# Patient Record
Sex: Female | Born: 1935 | Race: Black or African American | Hispanic: No | Marital: Single | Smoking: Never smoker
Health system: Southern US, Community
[De-identification: ages and names within clinical notes are randomized; demographics above are authoritative.]

## PROBLEM LIST (undated history)

## (undated) DIAGNOSIS — I10 Essential (primary) hypertension: Secondary | ICD-10-CM

## (undated) DIAGNOSIS — S129XXA Fracture of neck, unspecified, initial encounter: Secondary | ICD-10-CM

## (undated) DIAGNOSIS — M1990 Unspecified osteoarthritis, unspecified site: Secondary | ICD-10-CM

## (undated) DIAGNOSIS — K219 Gastro-esophageal reflux disease without esophagitis: Secondary | ICD-10-CM

## (undated) HISTORY — DX: Fracture of neck, unspecified, initial encounter: S12.9XXA

## (undated) HISTORY — DX: Unspecified osteoarthritis, unspecified site: M19.90

## (undated) HISTORY — PX: ABDOMINAL HYSTERECTOMY: SHX81

## (undated) HISTORY — DX: Gastro-esophageal reflux disease without esophagitis: K21.9

## (undated) HISTORY — DX: Essential (primary) hypertension: I10

---

## 2013-05-15 DIAGNOSIS — S129XXA Fracture of neck, unspecified, initial encounter: Secondary | ICD-10-CM

## 2013-05-15 HISTORY — PX: EYE SURGERY: SHX253

## 2013-05-15 HISTORY — DX: Fracture of neck, unspecified, initial encounter: S12.9XXA

## 2014-04-28 DIAGNOSIS — N184 Chronic kidney disease, stage 4 (severe): Secondary | ICD-10-CM | POA: Insufficient documentation

## 2014-04-28 DIAGNOSIS — N189 Chronic kidney disease, unspecified: Secondary | ICD-10-CM | POA: Insufficient documentation

## 2014-05-29 ENCOUNTER — Other Ambulatory Visit: Payer: Self-pay | Admitting: Neurosurgery

## 2014-05-29 DIAGNOSIS — S12100G Unspecified displaced fracture of second cervical vertebra, subsequent encounter for fracture with delayed healing: Secondary | ICD-10-CM

## 2014-06-02 ENCOUNTER — Encounter: Payer: Self-pay | Admitting: General Surgery

## 2014-06-02 ENCOUNTER — Ambulatory Visit (INDEPENDENT_AMBULATORY_CARE_PROVIDER_SITE_OTHER): Payer: Medicare Other | Admitting: General Surgery

## 2014-06-02 VITALS — BP 128/78 | HR 68 | Resp 14 | Ht 64.5 in | Wt 116.0 lb

## 2014-06-02 DIAGNOSIS — K8020 Calculus of gallbladder without cholecystitis without obstruction: Secondary | ICD-10-CM

## 2014-06-02 NOTE — Patient Instructions (Addendum)
The patient is aware to call back for any questions or concerns.  Cholelithiasis Cholelithiasis (also called gallstones) is a form of gallbladder disease. The gallbladder is a small organ that helps you digest fats. Symptoms of gallstones are:  Feeling sick to your stomach (nausea).  Throwing up (vomiting).  Belly pain.  Yellowing of the skin (jaundice).  Sudden pain. You may feel the pain for minutes to hours.  Fever.  Pain to the touch. HOME CARE  Only take medicines as told by your doctor.  Eat a low-fat diet until you see your doctor again. Eating fat can result in pain.  Follow up with your doctor as told. Attacks usually happen time after time. Surgery is usually needed for permanent treatment. GET HELP RIGHT AWAY IF:   Your pain gets worse.  Your pain is not helped by medicines.  You have a fever and lasting symptoms for more than 2-3 days.  You have a fever and your symptoms suddenly get worse.  You keep feeling sick to your stomach and throwing up. MAKE SURE YOU:   Understand these instructions.  Will watch your condition.  Will get help right away if you are not doing well or get worse. Document Released: 10/18/2007 Document Revised: 01/01/2013 Document Reviewed: 10/23/2012 [facility] Patient Information 2015 [place], [place]. This information is not intended to replace advice given to you by your health care provider. Make sure you discuss any questions you have with your health care provider.

## 2014-06-02 NOTE — Progress Notes (Signed)
Patient ID: [name], female   DOB: [birth year], 81 y.o.   MRN: [id number]  Chief Complaint  Patient presents with  . Other    gall stones    HPI [name] is a 81 y.o. female.  Here today for evaluation of gallstones. She is here today with her daughter in law and has lived with her and her son for about 4 months. She states the "attacks" are described as right abdominal pain "pressure" and "need to belch" feeling. Progressively getting worse. No nausea or vomiting. The pain last about 30-45 minutes. Not associated with foods but worse in the evenings. She does state that sometimes once she belches she feels better.  Ultrasound completed 05-06-14 at Dr. Elijio Miles office.  She is wearing a neck brace for a C2 fracture from falling out of the chair while living in [place]. She has seen a neurosurgeon and has a CT scan for March 2016.  HPI  Past Medical History  Diagnosis Date  . Hypertension   . Arthritis   . GERD (gastroesophageal reflux disease)   . Neck fracture 2015    C2    Past Surgical History  Procedure Laterality Date  . Abdominal hysterectomy    . Eye surgery  2015    cataract    History reviewed. No pertinent family history.  Social History History  Substance Use Topics  . Smoking status: Never Smoker   . Smokeless tobacco: Never Used  . Alcohol Use: No    No Known Allergies  Current Outpatient Prescriptions  Medication Sig Dispense Refill  . allopurinol (ZYLOPRIM) 300 MG tablet Take 300 mg by mouth daily.  2  . hydrALAZINE (APRESOLINE) 50 MG tablet Take 50 mg by mouth 3 (three) times daily.  2  . metoprolol succinate (TOPROL-XL) 50 MG 24 hr tablet Take 50 mg by mouth daily.  0  . NEXIUM 40 MG capsule Take 40 mg by mouth daily.  2  . sertraline (ZOLOFT) 25 MG tablet Take 25 mg by mouth every evening.  2  . sucralfate (CARAFATE) 1 G tablet Take 1 g by mouth 4 (four) times daily.   0  . tiZANidine (ZANAFLEX) 2 MG tablet Take 2 mg by mouth 2 (two)  times daily.  0   No current facility-administered medications for this visit.    Review of Systems Review of Systems  Constitutional: Negative.   Respiratory: Negative.   Cardiovascular: Negative.   Gastrointestinal: Positive for abdominal pain. Negative for nausea, diarrhea and constipation.    Blood pressure 128/78, pulse 68, resp. rate 14, height 5' 4.5" (1.638 m), weight 116 lb (52.617 kg).  Physical Exam Physical Exam  Constitutional: She is oriented to person, place, and time. She appears well-developed and well-nourished.  Eyes: Conjunctivae are normal. No scleral icterus.  Neck: Neck supple.  Cardiovascular: Normal rate, regular rhythm and normal heart sounds.   Pulmonary/Chest: Effort normal and breath sounds normal.  Abdominal: Soft. Normal appearance and bowel sounds are normal. There is no tenderness.  Lymphadenopathy:    She has no cervical adenopathy.  Neurological: She is alert and oriented to person, place, and time.  Skin: Skin is warm and dry.    Data Reviewed Office notes and ultrasound report-showing gallstones.  Assessment    Gall stones. Likely this is the cause of her abd pain. However she has some significant other health issues-C 2 fracture, new kidney issues based on recent labs-seeing nephrologist    Plan    Pt  needs clearance from neurology and nephrology before any decision made about her gallbladder. I discussed all of this in full with pt and her daughter-in-law. In the event of her developing acute cholecystitis, consideration can also be given for percutaneous drainage      Cc: Dr. Sharrie Rothman 06/02/2014, 4:58 PM

## 2014-06-04 ENCOUNTER — Ambulatory Visit: Payer: Self-pay | Admitting: General Surgery

## 2014-06-04 ENCOUNTER — Ambulatory Visit: Payer: Self-pay | Admitting: Nephrology

## 2014-06-13 ENCOUNTER — Emergency Department: Payer: Self-pay | Admitting: Emergency Medicine

## 2014-06-13 LAB — COMPREHENSIVE METABOLIC PANEL
Albumin: 3.4 g/dL (ref 3.4–5.0)
Alkaline Phosphatase: 69 U/L (ref 46–116)
Anion Gap: 6 — ABNORMAL LOW (ref 7–16)
BUN: 14 mg/dL (ref 7–18)
Bilirubin,Total: 0.2 mg/dL (ref 0.2–1.0)
Calcium, Total: 9 mg/dL (ref 8.5–10.1)
Chloride: 101 mmol/L (ref 98–107)
Co2: 33 mmol/L — ABNORMAL HIGH (ref 21–32)
Creatinine: 1.1 mg/dL (ref 0.60–1.30)
EGFR (African American): 60 — ABNORMAL LOW
EGFR (Non-African Amer.): 49 — ABNORMAL LOW
Glucose: 99 mg/dL (ref 65–99)
Osmolality: 280 (ref 275–301)
Potassium: 4 mmol/L (ref 3.5–5.1)
SGOT(AST): 26 U/L (ref 15–37)
SGPT (ALT): 14 U/L (ref 14–63)
Sodium: 140 mmol/L (ref 136–145)
Total Protein: 7.7 g/dL (ref 6.4–8.2)

## 2014-06-13 LAB — CBC
HCT: 29 % — ABNORMAL LOW (ref 35.0–47.0)
HGB: 9.3 g/dL — ABNORMAL LOW (ref 12.0–16.0)
MCH: 26.2 pg (ref 26.0–34.0)
MCHC: 32.1 g/dL (ref 32.0–36.0)
MCV: 82 fL (ref 80–100)
Platelet: 316 10*3/uL (ref 150–440)
RBC: 3.56 10*6/uL — ABNORMAL LOW (ref 3.80–5.20)
RDW: 15.1 % — ABNORMAL HIGH (ref 11.5–14.5)
WBC: 7.4 10*3/uL (ref 3.6–11.0)

## 2014-06-13 LAB — LIPASE, BLOOD: Lipase: 69 U/L — ABNORMAL LOW (ref 73–393)

## 2014-06-13 LAB — TROPONIN I: Troponin-I: <0.02 ng/mL

## 2014-06-17 ENCOUNTER — Telehealth: Payer: Self-pay | Admitting: *Deleted

## 2014-06-17 NOTE — Telephone Encounter (Signed)
Per Nona Dell, patient's daughter in law, patient was seen in the ED over the weekend because she had been in a lot of pain and crying. States patient also keeps loosing weight.  Dr. Jamal Collin spoke with patient's daughter in law today. She was made aware that he needs neurosurgery and cardiac clearance prior to scheduling surgery for this patient. He is concerned about intubating the patient at time of surgery due to her C2 spine fracture.  Angel at Dr. Levan Hurst office (Phone: [phone]) was contacted and made aware that we will need neurosurgery clearance prior to scheduling gallbladder surgery. Dr. Sherwood Gambler is out of town the rest of this week and will return on Monday, 06-22-14. Glenard Haring will talk with him and see if March 2016 CT scan and office visit can be moved up so that patient can go ahead and get clearance. We will await a call from their office on Monday afternoon.   Once we receive clearance from neurosurgery, patient will also need cardiac clearance from her cardiologist (Dr. Neoma Laming) prior to gallbladder surgery.

## 2014-06-25 ENCOUNTER — Encounter: Payer: Self-pay | Admitting: *Deleted

## 2014-06-25 NOTE — Progress Notes (Signed)
Patient ID: [name], female   DOB: 1920-09-17, 81 y.o.   MRN: [id number]  Message was left for [facility] at Dr. Donnella Bi office since we have not heard back regarding surgical clearance for this patient.   We will await their decision.

## 2014-07-01 ENCOUNTER — Telehealth: Payer: Self-pay | Admitting: *Deleted

## 2014-07-01 NOTE — Telephone Encounter (Signed)
Per Glenard Haring at Dr. Donnella Bi office [facility]), she states that Dr. Sherwood Gambler does not think that patient's fracture will be healed in the next month and he does not feel the need to repeat CT scan at this time. Also, if Dr. Jamal Collin feels that surgery is necessary and cannot wait, then the anesthesiologist will need to do an intubation with a glide scope keeping the head and neck in a neutral position and immobilized.   I have requested that their office fax [place] something in writing stating this with MD signature. Fax number provided.

## 2014-07-01 NOTE — Telephone Encounter (Signed)
Patient's daughter-in-law, Nona Dell, was contacted today and notified of Dr. Donnella Bi response.  An appointment for follow up with Dr. Jamal Collin was made for tomorrow, 07-02-14. Patient's son will be bringing the patient to the appointment.   Daughter-in-law states that the patient's nephrologist is Dr. Juleen China (Phone: [phone]). A message was left for [facility] at [facility] to fax all recent notes, labs, x-ray reports, etc.

## 2014-07-02 ENCOUNTER — Encounter: Payer: Self-pay | Admitting: General Surgery

## 2014-07-02 ENCOUNTER — Ambulatory Visit (INDEPENDENT_AMBULATORY_CARE_PROVIDER_SITE_OTHER): Payer: Medicare Other | Admitting: General Surgery

## 2014-07-02 VITALS — BP 124/64 | HR 64 | Resp 14 | Ht 64.0 in | Wt 112.0 lb

## 2014-07-02 DIAGNOSIS — K8020 Calculus of gallbladder without cholecystitis without obstruction: Secondary | ICD-10-CM

## 2014-07-02 NOTE — Patient Instructions (Signed)
Cholecystitis Cholecystitis is an inflammation of your gallbladder. It is usually caused by a buildup of gallstones or sludge (cholelithiasis) in your gallbladder. The gallbladder stores a fluid that helps digest fats (bile). Cholecystitis is serious and needs treatment right away.  CAUSES   Gallstones. Gallstones can block the tube that leads to your gallbladder, causing bile to build up. As bile builds up, the gallbladder becomes inflamed.  Bile duct problems, such as blockage from scarring or kinking.  Tumors. Tumors can stop bile from leaving your gallbladder correctly, causing bile to build up. As bile builds up, the gallbladder becomes inflamed. SYMPTOMS   Nausea.  Vomiting.  Abdominal pain, especially in the upper right area of your abdomen.  Abdominal tenderness or bloating.  Sweating.  Chills.  Fever.  Yellowing of the skin and the whites of the eyes (jaundice). DIAGNOSIS  Your caregiver may order blood tests to look for infection or gallbladder problems. Your caregiver may also order imaging tests, such as an ultrasound or computed tomography (CT) scan. Further tests may include a hepatobiliary iminodiacetic acid (HIDA) scan. This scan allows your caregiver to see your bile move from the liver to the gallbladder and to the small intestine. TREATMENT  A hospital stay is usually necessary to lessen the inflammation of your gallbladder. You may be required to not eat or drink (fast) for a certain amount of time. You may be given medicine to treat pain or an antibiotic medicine to treat an infection. Surgery may be needed to remove your gallbladder (cholecystectomy) once the inflammation has gone down. Surgery may be needed right away if you develop complications such as death of gallbladder tissue (gangrene) or a tear (perforation) of the gallbladder.  [place] care will depend on your treatment. In general:  If you were given antibiotics, take them as  directed. Finish them even if you start to feel better.  Only take over-the-counter or prescription medicines for pain, discomfort, or fever as directed by your caregiver.  Follow a low-fat diet until you see your caregiver again.  Keep all follow-up visits as directed by your caregiver. SEEK IMMEDIATE MEDICAL CARE IF:   Your pain is increasing and not controlled by medicines.  Your pain moves to another part of your abdomen or to your back.  You have a fever.  You have nausea and vomiting. MAKE SURE YOU:  Understand these instructions.  Will watch your condition.  Will get help right away if you are not doing well or get worse. Document Released: 05/01/2005 Document Revised: 07/24/2011 Document Reviewed: 03/17/2011 [facility] Patient Information 2015 [place], [place]. This information is not intended to replace advice given to you by your health care provider. Make sure you discuss any questions you have with your health care provider.

## 2014-07-02 NOTE — Progress Notes (Signed)
Patient ID: [name], female   DOB: [birth year], 81 y.o.   MRN: [id number]  Chief Complaint  Patient presents with  . Other    Gallbladder    HPI [name] is a 81 y.o. female here today to discuss gallbladder surgery. She states that the pain is described as right abdominal pain "pressure" and "need to belch" feeling. The pain is better with the hydrocodone. Her appointment is in March for follow up from the neck fracture. She is still wearing the neck support. She is here today with her son.  HPI  Past Medical History  Diagnosis Date  . Hypertension   . Arthritis   . GERD (gastroesophageal reflux disease)   . Neck fracture 2015    C2    Past Surgical History  Procedure Laterality Date  . Abdominal hysterectomy    . Eye surgery  2015    cataract    History reviewed. No pertinent family history.  Social History History  Substance Use Topics  . Smoking status: Never Smoker   . Smokeless tobacco: Never Used  . Alcohol Use: No    No Known Allergies  Current Outpatient Prescriptions  Medication Sig Dispense Refill  . allopurinol (ZYLOPRIM) 300 MG tablet Take 300 mg by mouth daily.  2  . hydrALAZINE (APRESOLINE) 50 MG tablet Take 50 mg by mouth 3 (three) times daily.  2  . HYDROcodone-acetaminophen (NORCO/VICODIN) 5-325 MG per tablet   0  . metoprolol succinate (TOPROL-XL) 50 MG 24 hr tablet Take 50 mg by mouth daily.  0  . NEXIUM 40 MG capsule Take 40 mg by mouth daily.  2  . sertraline (ZOLOFT) 25 MG tablet Take 25 mg by mouth every evening.  2  . sucralfate (CARAFATE) 1 G tablet Take 1 g by mouth 4 (four) times daily.   0  . tiZANidine (ZANAFLEX) 2 MG tablet Take 2 mg by mouth 2 (two) times daily.  0  . Vitamin D, Ergocalciferol, (DRISDOL) 50000 UNITS CAPS capsule   0   No current facility-administered medications for this visit.    Review of Systems Review of Systems  Constitutional: Negative.   Respiratory: Negative.   Cardiovascular: Negative.    Gastrointestinal: Positive for nausea and abdominal pain. Negative for vomiting, diarrhea, constipation, blood in stool, abdominal distention, anal bleeding and rectal pain.    Blood pressure 124/64, pulse 64, resp. rate 14, height 5\' 4"  (1.626 m), weight 112 lb (50.803 kg).  Physical Exam Physical Exam  Constitutional: She is oriented to person, place, and time. She appears well-developed and well-nourished.  Neck: Neck supple.  Cardiovascular: Normal rate, regular rhythm and normal heart sounds.   Pulmonary/Chest: Effort normal and breath sounds normal.  Abdominal: Soft. Bowel sounds are normal. There is no tenderness.  Lymphadenopathy:    She has no cervical adenopathy.  Neurological: She is alert and oriented to person, place, and time.  Skin: Skin is warm and dry.    Data Reviewed Office notes. Recent labs from nephrologist Assessment    Stable physical exam. Pt has biliary colic but has not had any complications from gallstones. Received note from neurosurgery-her C2 fracture is not fully healed. Unsafe to attempt an ET tube for general anesthesia at present.     Plan    Follow up in one month after she has f/u CT of C spine and neurosurgery f/u. Discussed all this in full with pt.        SANKAR,SEEPLAPUTHUR G 07/02/2014, 2:28  PM

## 2014-07-27 ENCOUNTER — Ambulatory Visit
Admission: RE | Admit: 2014-07-27 | Discharge: 2014-07-27 | Disposition: A | Discharge status: Home / Self Care | Payer: Medicare Other | Source: Ambulatory Visit | Attending: Neurosurgery | Admitting: Neurosurgery

## 2014-07-27 DIAGNOSIS — S12100G Unspecified displaced fracture of second cervical vertebra, subsequent encounter for fracture with delayed healing: Secondary | ICD-10-CM

## 2014-08-03 ENCOUNTER — Encounter: Payer: Self-pay | Admitting: General Surgery

## 2014-08-03 ENCOUNTER — Ambulatory Visit (INDEPENDENT_AMBULATORY_CARE_PROVIDER_SITE_OTHER): Payer: Medicare Other | Admitting: General Surgery

## 2014-08-03 VITALS — BP 140/60 | HR 66 | Resp 14 | Ht 64.0 in | Wt 112.6 lb

## 2014-08-03 DIAGNOSIS — K8020 Calculus of gallbladder without cholecystitis without obstruction: Secondary | ICD-10-CM | POA: Diagnosis not present

## 2014-08-03 NOTE — Patient Instructions (Addendum)

## 2014-08-03 NOTE — Progress Notes (Signed)
Patient ID: CLOYE LIONS, female   DOB: 1921-05-04, 81 y.o.   MRN: [id number]  Chief Complaint  Patient presents with  . Follow-up    CT scan results    HPI [name] is a 81 y.o. female here today for her results of her CT scan that was done on 07/27/14 ordered by Dr Sherwood Gambler. She has been cleared by neurosurgery for her neck. She does report gallbladder attacks daily these vary in pain and length. She is followed by Dr. Murlean Iba for her kidneys. HPI  Past Medical History  Diagnosis Date  . Hypertension   . Arthritis   . GERD (gastroesophageal reflux disease)   . Neck fracture 2015    C2    Past Surgical History  Procedure Laterality Date  . Abdominal hysterectomy    . Eye surgery  2015    cataract    No family history on file.  Social History History  Substance Use Topics  . Smoking status: Never Smoker   . Smokeless tobacco: Never Used  . Alcohol Use: No    No Known Allergies  Current Outpatient Prescriptions  Medication Sig Dispense Refill  . allopurinol (ZYLOPRIM) 300 MG tablet Take 300 mg by mouth daily.  2  . hydrALAZINE (APRESOLINE) 50 MG tablet Take 50 mg by mouth 3 (three) times daily.  2  . HYDROcodone-acetaminophen (NORCO/VICODIN) 5-325 MG per tablet   0  . metoprolol succinate (TOPROL-XL) 50 MG 24 hr tablet Take 50 mg by mouth daily.  0  . NEXIUM 40 MG capsule Take 40 mg by mouth daily.  2  . sertraline (ZOLOFT) 25 MG tablet Take 25 mg by mouth every evening.  2  . sucralfate (CARAFATE) 1 G tablet Take 1 g by mouth 4 (four) times daily.   0  . tiZANidine (ZANAFLEX) 2 MG tablet Take 2 mg by mouth 2 (two) times daily.  0  . Vitamin D, Ergocalciferol, (DRISDOL) 50000 UNITS CAPS capsule   0   No current facility-administered medications for this visit.    Review of Systems Review of Systems  Constitutional: Negative.   Respiratory: Negative.   Cardiovascular: Negative.     Blood pressure 140/60, pulse 66, resp. rate 14, height 5\' 4"  (1.626  m), weight 112 lb 9.6 oz (51.075 kg).  Physical Exam Physical Exam  Constitutional: She is oriented to person, place, and time. She appears well-developed and well-nourished.  Eyes: Conjunctivae are normal. No scleral icterus.  Neck: Neck supple.  Cardiovascular: Normal rate, regular rhythm and normal heart sounds.   Pulmonary/Chest: Effort normal and breath sounds normal.  Abdominal: Soft. Bowel sounds are normal. She exhibits no distension. There is no tenderness.  Lymphadenopathy:    She has no cervical adenopathy.  Neurological: She is alert and oriented to person, place, and time.  Skin: Skin is warm and dry.    Data Reviewed Prior notes.  Assessment    Gallstones. Symptomatic with frequent episodes of ruq pain. Pt is in very good health and with good performance status.   Plan   Recent CT of the neck showed complete healing of the C2 fracture. She has been cleared by neurosurgery for safe intubation.  Laparoscopic Cholecystectomy with Intraoperative Cholangiogram. The procedure, including it's potential risks and complications (including but not limited to infection, bleeding, injury to intra-abdominal organs or bile ducts, bile leak, poor cosmetic result, sepsis and death) were discussed with the patient in detail. Non-operative options, including their inherent risks (acute calculous cholecystitis  with possible choledocholithiasis or gallstone pancreatitis, with the risk of ascending cholangitis, sepsis, and death) were discussed as well. The patient expressed and understanding of what we discussed and wishes to proceed with laparoscopic cholecystectomy. The patient further understands that if it is technically not possible, or it is unsafe to proceed laparoscopically, that I will convert to an open cholecystectomy.   Will get baseline labs today. If ok will proceed with surgery.  Patient's surgery has been scheduled for 08-06-14 at [facility].  Demorris Choyce G 08/03/2014, 10:32  AM

## 2014-08-04 LAB — CBC WITH DIFFERENTIAL/PLATELET
Basophils Absolute: 0 10*3/uL (ref 0.0–0.2)
Basos: 0 %
Eos: 0 %
Eosinophils Absolute: 0 10*3/uL (ref 0.0–0.4)
HCT: 31.8 % — ABNORMAL LOW (ref 34.0–46.6)
Hemoglobin: 10 g/dL — ABNORMAL LOW (ref 11.1–15.9)
Immature Grans (Abs): 0 10*3/uL (ref 0.0–0.1)
Immature Granulocytes: 0 %
Lymphocytes Absolute: 1.7 10*3/uL (ref 0.7–3.1)
Lymphs: 21 %
MCH: 24.9 pg — ABNORMAL LOW (ref 26.6–33.0)
MCHC: 31.4 g/dL — ABNORMAL LOW (ref 31.5–35.7)
MCV: 79 fL (ref 79–97)
Monocytes Absolute: 0.6 10*3/uL (ref 0.1–0.9)
Monocytes: 7 %
Neutrophils Absolute: 5.8 10*3/uL (ref 1.4–7.0)
Neutrophils Relative %: 72 %
Platelets: 464 10*3/uL — ABNORMAL HIGH (ref 150–379)
RBC: 4.02 x10E6/uL (ref 3.77–5.28)
RDW: 17.7 % — ABNORMAL HIGH (ref 12.3–15.4)
WBC: 8.2 10*3/uL (ref 3.4–10.8)

## 2014-08-04 LAB — COMPREHENSIVE METABOLIC PANEL
ALT: 9 IU/L (ref 0–32)
AST: 13 IU/L (ref 0–40)
Albumin/Globulin Ratio: 1.1 (ref 1.1–2.5)
Albumin: 3.7 g/dL (ref 3.2–4.6)
Alkaline Phosphatase: 72 IU/L (ref 39–117)
BUN/Creatinine Ratio: 7 — ABNORMAL LOW (ref 11–26)
BUN: 8 mg/dL — ABNORMAL LOW (ref 10–36)
Bilirubin Total: 0.2 mg/dL (ref 0.0–1.2)
CO2: 29 mmol/L (ref 18–29)
Calcium: 9.3 mg/dL (ref 8.7–10.3)
Chloride: 100 mmol/L (ref 97–108)
Creatinine, Ser: 1.13 mg/dL — ABNORMAL HIGH (ref 0.57–1.00)
GFR calc Af Amer: 49 mL/min/{1.73_m2} — ABNORMAL LOW (ref >59)
GFR calc non Af Amer: 43 mL/min/{1.73_m2} — ABNORMAL LOW (ref >59)
Globulin, Total: 3.3 g/dL (ref 1.5–4.5)
Glucose: 82 mg/dL (ref 65–99)
Potassium: 5 mmol/L (ref 3.5–5.2)
Sodium: 146 mmol/L — ABNORMAL HIGH (ref 134–144)
Total Protein: 7 g/dL (ref 6.0–8.5)

## 2014-08-05 ENCOUNTER — Ambulatory Visit: Payer: Self-pay | Admitting: General Surgery

## 2014-08-06 ENCOUNTER — Encounter: Payer: Self-pay | Admitting: General Surgery

## 2014-08-06 ENCOUNTER — Ambulatory Visit: Payer: Self-pay | Admitting: General Surgery

## 2014-08-06 DIAGNOSIS — K8010 Calculus of gallbladder with chronic cholecystitis without obstruction: Secondary | ICD-10-CM

## 2014-08-06 HISTORY — PX: CHOLECYSTECTOMY: SHX55

## 2014-08-10 ENCOUNTER — Encounter: Payer: Self-pay | Admitting: General Surgery

## 2014-08-17 ENCOUNTER — Encounter: Payer: Self-pay | Admitting: General Surgery

## 2014-08-17 ENCOUNTER — Ambulatory Visit (INDEPENDENT_AMBULATORY_CARE_PROVIDER_SITE_OTHER): Payer: Medicare Other | Admitting: General Surgery

## 2014-08-17 VITALS — BP 136/70 | HR 74 | Resp 12 | Ht 64.0 in | Wt 117.0 lb

## 2014-08-17 DIAGNOSIS — K8020 Calculus of gallbladder without cholecystitis without obstruction: Secondary | ICD-10-CM

## 2014-08-17 NOTE — Progress Notes (Signed)
This is a 81 year old female here today for her post op gallbladder surgery done on 08/06/14. Patient state she is doing well.  Port sites are clean and healing well. Lungs are clear. Path showed cholelithiasis and chronic cholecystitis Patient to return as needed.

## 2014-08-17 NOTE — Patient Instructions (Signed)
Patient to return as needed. 

## 2014-09-07 LAB — SURGICAL PATHOLOGY

## 2014-09-13 NOTE — Op Note (Signed)
PATIENT NAME:  [name], [name] MR#:  [id number] DATE OF BIRTH:  [birth year]  DATE OF PROCEDURE:  08/06/2014  PREOPERATIVE DIAGNOSIS: Chronic cholecystitis and cholelithiasis.   POSTOPERATIVE DIAGNOSIS: Chronic cholecystitis and cholelithiasis.   OPERATION: Laparoscopy, cholecystectomy, attempted cholangiogram.   SURGEON: S.G. Jamal Collin, MD   ANESTHESIA: General.   COMPLICATIONS: None.   ESTIMATED BLOOD LOSS: Minimal.   DRAINS: None.   PROCEDURE: This patient was put to sleep in the supine position on the operating table. The abdomen was prepped and draped out as a sterile field. Timeout was performed. At the umbilicus, a port site incision was made and the fascia was lifted up and the Veress needle positioned in the peritoneal cavity, verified with the hanging drop method. Pneumoperitoneum was obtained and a 10 mm port was placed. The camera was introduced with good visualization of the peritoneal cavity. The liver was tented up to the anterior abdominal wall, with filmy adhesions likely from prior perihepatitis. The liver otherwise appeared normal. Epigastric and 2 lateral 5 mm ports were placed. The gallbladder was satisfactorily exposed and retracted cephalad. It was noted to be moderately distended but without any acute changes. Some adhesions surrounding this were taken down and the Hartmann pouch was identified, and further dissection was then performed to free up the cystic duct and cholangiogram was performed with a Kumar clamp and catheter. The contrast preferentially flowed to the gallbladder and did not fill the bile duct. Manipulation of the catheter did not allow this to happen. Further attempts were not made since the patient had no evidence of common duct involvement on clinical exam and labs. The catheter that was used to decompress the gallbladder removed. The cystic duct was hemoclipped and cut. The cystic artery was identified. It was freed, hemoclipped and cut, and the posterior  branch was similarly hemoclipped and cut. The gallbladder was dissected free from its bed using cautery for control of bleeding. After ensuring hemostasis, the area was irrigated with saline and all fluid was suctioned out. The gallbladder was brought out through the umbilical port site with a retrieval bag and noted to contain multiple pigmented stones measuring up to 5 mm in size. The fascial opening of the umbilicus was closed with 2-0 Vicryl stitches. Pneumoperitoneum was released. The remaining ports were removed. Skin incisions were closed with subcuticular 4-0 Vicryl, reinforced with Steri-Strips, and a dry sterile dressing was placed. The patient subsequently was extubated and returned to the recovery room in stable condition.    ____________________________ S.Robinette Haines, MD sgs:ST D: 08/07/2014 14:58:15 ET T: 08/08/2014 00:08:38 ET JOB#: [id number]  cc: S.G. Jamal Collin, MD, <Dictator> [facility] Robinette Haines MD ELECTRONICALLY SIGNED 08/08/2014 15:27

## 2015-03-17 ENCOUNTER — Inpatient Hospital Stay
Admission: EM | Admit: 2015-03-17 | Discharge: 2015-03-19 | DRG: 392 | Disposition: A | Discharge status: Home / Self Care | Payer: Medicare Other | Attending: General Surgery | Admitting: General Surgery

## 2015-03-17 DIAGNOSIS — M1990 Unspecified osteoarthritis, unspecified site: Secondary | ICD-10-CM | POA: Diagnosis present

## 2015-03-17 DIAGNOSIS — Z79899 Other long term (current) drug therapy: Secondary | ICD-10-CM | POA: Diagnosis not present

## 2015-03-17 DIAGNOSIS — I129 Hypertensive chronic kidney disease with stage 1 through stage 4 chronic kidney disease, or unspecified chronic kidney disease: Secondary | ICD-10-CM | POA: Diagnosis present

## 2015-03-17 DIAGNOSIS — R109 Unspecified abdominal pain: Secondary | ICD-10-CM | POA: Diagnosis present

## 2015-03-17 DIAGNOSIS — D638 Anemia in other chronic diseases classified elsewhere: Secondary | ICD-10-CM | POA: Diagnosis present

## 2015-03-17 DIAGNOSIS — N183 Chronic kidney disease, stage 3 (moderate): Secondary | ICD-10-CM | POA: Diagnosis present

## 2015-03-17 DIAGNOSIS — K219 Gastro-esophageal reflux disease without esophagitis: Secondary | ICD-10-CM | POA: Diagnosis present

## 2015-03-17 DIAGNOSIS — E876 Hypokalemia: Secondary | ICD-10-CM | POA: Diagnosis present

## 2015-03-17 DIAGNOSIS — R1030 Lower abdominal pain, unspecified: Secondary | ICD-10-CM

## 2015-03-17 LAB — COMPREHENSIVE METABOLIC PANEL
ALT: 8 U/L — ABNORMAL LOW (ref 14–54)
AST: 17 U/L (ref 15–41)
Albumin: 3.7 g/dL (ref 3.5–5.0)
Alkaline Phosphatase: 62 U/L (ref 38–126)
Anion gap: 9 (ref 5–15)
BUN: 16 mg/dL (ref 6–20)
CO2: 27 mmol/L (ref 22–32)
Calcium: 9.2 mg/dL (ref 8.9–10.3)
Chloride: 101 mmol/L (ref 101–111)
Creatinine, Ser: 1.12 mg/dL — ABNORMAL HIGH (ref 0.44–1.00)
GFR calc Af Amer: 48 mL/min — ABNORMAL LOW (ref >60)
GFR calc non Af Amer: 41 mL/min — ABNORMAL LOW (ref >60)
Glucose, Bld: 121 mg/dL — ABNORMAL HIGH (ref 65–99)
Potassium: 3.9 mmol/L (ref 3.5–5.1)
Sodium: 137 mmol/L (ref 135–145)
Total Bilirubin: 0.2 mg/dL — ABNORMAL LOW (ref 0.3–1.2)
Total Protein: 8 g/dL (ref 6.5–8.1)

## 2015-03-17 LAB — TYPE AND SCREEN
ABO/RH(D): B POS
Antibody Screen: NEGATIVE

## 2015-03-17 LAB — CBC WITH DIFFERENTIAL/PLATELET
Basophils Absolute: 0.1 10*3/uL (ref 0–0.1)
Basophils Relative: 1 %
Eosinophils Absolute: 0 10*3/uL (ref 0–0.7)
Eosinophils Relative: 0 %
HCT: 29.3 % — ABNORMAL LOW (ref 35.0–47.0)
Hemoglobin: 9.3 g/dL — ABNORMAL LOW (ref 12.0–16.0)
Lymphocytes Relative: 22 %
Lymphs Abs: 1.7 10*3/uL (ref 1.0–3.6)
MCH: 26 pg (ref 26.0–34.0)
MCHC: 31.9 g/dL — ABNORMAL LOW (ref 32.0–36.0)
MCV: 81.3 fL (ref 80.0–100.0)
Monocytes Absolute: 0.5 10*3/uL (ref 0.2–0.9)
Monocytes Relative: 6 %
Neutro Abs: 5.4 10*3/uL (ref 1.4–6.5)
Neutrophils Relative %: 71 %
Platelets: 407 10*3/uL (ref 150–440)
RBC: 3.6 MIL/uL — ABNORMAL LOW (ref 3.80–5.20)
RDW: 15.6 % — ABNORMAL HIGH (ref 11.5–14.5)
WBC: 7.8 10*3/uL (ref 3.6–11.0)

## 2015-03-17 LAB — ABO/RH: ABO/RH(D): B POS

## 2015-03-17 MED ORDER — DICLOFENAC SODIUM 1 % TD GEL
2.0000 g | Freq: Two times a day (BID) | TRANSDERMAL | Status: DC
  Administered 2015-03-17 – 2015-03-19 (×4): 2 g via TOPICAL
  Filled 2015-03-17: qty 100

## 2015-03-17 MED ORDER — DEXTROSE-NACL 5-0.45 % IV SOLN
INTRAVENOUS | Status: DC
  Administered 2015-03-17 – 2015-03-18 (×3): via INTRAVENOUS

## 2015-03-17 MED ORDER — SODIUM CHLORIDE 0.9 % IV SOLN
500.0000 mg | INTRAVENOUS | Status: DC
  Administered 2015-03-17 – 2015-03-18 (×2): 0.5 g via INTRAVENOUS
  Filled 2015-03-17 (×4): qty 0.5

## 2015-03-17 MED ORDER — ACETAMINOPHEN 650 MG RE SUPP: 650.0000 mg | Freq: Four times a day (QID) | RECTAL | Status: DC | PRN

## 2015-03-17 MED ORDER — SERTRALINE HCL 50 MG PO TABS
25.0000 mg | ORAL_TABLET | Freq: Every evening | ORAL | Status: DC
  Administered 2015-03-17 – 2015-03-18 (×2): 25 mg via ORAL
  Filled 2015-03-17: qty 2
  Filled 2015-03-17: qty 1

## 2015-03-17 MED ORDER — TIZANIDINE HCL 2 MG PO TABS
2.0000 mg | ORAL_TABLET | Freq: Two times a day (BID) | ORAL | Status: DC
  Administered 2015-03-17 – 2015-03-19 (×4): 2 mg via ORAL
  Filled 2015-03-17 (×5): qty 1

## 2015-03-17 MED ORDER — PANTOPRAZOLE SODIUM 40 MG PO TBEC
40.0000 mg | DELAYED_RELEASE_TABLET | Freq: Every day | ORAL | Status: DC
  Administered 2015-03-18 – 2015-03-19 (×2): 40 mg via ORAL
  Filled 2015-03-17 (×2): qty 1

## 2015-03-17 MED ORDER — SODIUM CHLORIDE 0.9 % IV SOLN
1.0000 g | INTRAVENOUS | Status: DC
  Filled 2015-03-17: qty 1

## 2015-03-17 MED ORDER — HYDRALAZINE HCL 20 MG/ML IJ SOLN
10.0000 mg | Freq: Four times a day (QID) | INTRAMUSCULAR | Status: DC | PRN
  Administered 2015-03-17: 10 mg via INTRAVENOUS
  Filled 2015-03-17: qty 1

## 2015-03-17 MED ORDER — HYDROCODONE-ACETAMINOPHEN 5-325 MG PO TABS
1.0000 | ORAL_TABLET | Freq: Two times a day (BID) | ORAL | Status: DC | PRN
  Administered 2015-03-17: 1 via ORAL
  Filled 2015-03-17: qty 1

## 2015-03-17 MED ORDER — ALLOPURINOL 300 MG PO TABS
300.0000 mg | ORAL_TABLET | Freq: Every day | ORAL | Status: DC
  Administered 2015-03-17 – 2015-03-19 (×3): 300 mg via ORAL
  Filled 2015-03-17 (×3): qty 1

## 2015-03-17 MED ORDER — FERROUS SULFATE 325 (65 FE) MG PO TABS
325.0000 mg | ORAL_TABLET | Freq: Every day | ORAL | Status: DC
  Administered 2015-03-17 – 2015-03-19 (×3): 325 mg via ORAL
  Filled 2015-03-17 (×3): qty 1

## 2015-03-17 MED ORDER — ACETAMINOPHEN 325 MG PO TABS: 650.0000 mg | ORAL_TABLET | Freq: Four times a day (QID) | ORAL | Status: DC | PRN

## 2015-03-17 MED ORDER — METOPROLOL TARTRATE 25 MG PO TABS
25.0000 mg | ORAL_TABLET | Freq: Two times a day (BID) | ORAL | Status: DC
  Administered 2015-03-17 – 2015-03-19 (×4): 25 mg via ORAL
  Filled 2015-03-17 (×4): qty 1

## 2015-03-17 MED ORDER — HYDRALAZINE HCL 50 MG PO TABS
50.0000 mg | ORAL_TABLET | Freq: Three times a day (TID) | ORAL | Status: DC
  Administered 2015-03-17 – 2015-03-19 (×5): 50 mg via ORAL
  Filled 2015-03-17 (×6): qty 1

## 2015-03-17 NOTE — ED Notes (Signed)
[name]  (Daughter in law: Patient lives with her son and daughter in law)   She can be called at anytime with any issues [phone]

## 2015-03-17 NOTE — Progress Notes (Signed)
ANTIBIOTIC CONSULT NOTE - INITIAL  Pharmacy Consult for Ertapenem Indication: Abdominal pain  No Known Allergies  Patient Measurements: Height: 5\' 7"  (170.2 cm) Weight: 119 lb (53.978 kg) IBW/kg (Calculated) : 61.6  Vital Signs: Temp: 98.9 F (37.2 C) (11/02 1504) Temp Source: Oral (11/02 1504) BP: 199/79 mmHg (11/02 1800) Pulse Rate: 79 (11/02 1504) Intake/Output from previous day:   Intake/Output from this shift:    Labs:  Recent Labs  03/17/15 1508  WBC 7.8  HGB 9.3*  PLT 407  CREATININE 1.12*   Estimated Creatinine Clearance: 27.3 mL/min (by C-G formula based on Cr of 1.12). No results for input(s): VANCOTROUGH, VANCOPEAK, VANCORANDOM, GENTTROUGH, GENTPEAK, GENTRANDOM, TOBRATROUGH, TOBRAPEAK, TOBRARND, AMIKACINPEAK, AMIKACINTROU, AMIKACIN in the last 72 hours.   Microbiology: No results found for this or any previous visit (from the past 720 hour(s)).  Medical History: Past Medical History  Diagnosis Date  . Hypertension   . Arthritis   . GERD (gastroesophageal reflux disease)   . Neck fracture ([place]) 2015    C2    Medications:  Scheduled:   Infusions:  . ertapenem     Assessment: 81 y/o F admitted with abdominal pain ordered ertapenem per surgery.   Plan:  Will adjust dosing to 500 mg iv q 24 hours due to renal insufficieny. Will continue to follow renal function and culture results.   Ulice Dash D 03/17/2015,7:09 PM

## 2015-03-17 NOTE — ED Notes (Signed)
Pt from home; had CT this am and was called by her MD office and told that she has appendicitis and needed to go immediately to Ed. Pt alert & oriented with NAD.

## 2015-03-17 NOTE — ED Provider Notes (Signed)
[facility] Emergency Department Provider Note  ____________________________________________  Time seen: 1530  I have reviewed the triage vital signs and the nursing notes.   HISTORY  Chief Complaint Abdominal Pain     HPI [name] is a 81 y.o. female who has had pain in her lower abdomen for approximately 6 days. She is very pleasant in no acute distress. She appears somewhat stoic, and that appears to limit history little bit. She has someone accompanying her who helps with additional history.  The patient has not had any nausea or vomiting. She's had no diarrhea or notable fever.  With this lower abdominal pain, she went to see her regular doctor, Dr. Conley Rolls se, who arranged for a CT scan to be performed at [place]. The CT report notes a abnormal dilated appendix measuring 9-10 mm, but without any definitive periappendiceal inflammation, free air, or abscess.  [facility] called the family and reported that she needed to calm to the emergency department for further evaluation.  The patient is approximately 6 months status post cholecystectomy with Dr. Jamal Collin.    Past Medical History  Diagnosis Date  . Hypertension   . Arthritis   . GERD (gastroesophageal reflux disease)   . Neck fracture ([place]) 2015    C2    There are no active problems to display for this patient.   Past Surgical History  Procedure Laterality Date  . Abdominal hysterectomy    . Eye surgery  2015    cataract  . Cholecystectomy  08/06/14    Current Outpatient Rx  Name  Route  Sig  Dispense  Refill  . allopurinol (ZYLOPRIM) 300 MG tablet   Oral   Take 300 mg by mouth daily.      2   . hydrALAZINE (APRESOLINE) 50 MG tablet   Oral   Take 50 mg by mouth 3 (three) times daily.      2   . HYDROcodone-acetaminophen (NORCO/VICODIN) 5-325 MG per tablet            0   . metoprolol succinate (TOPROL-XL) 50 MG 24 hr tablet   Oral   Take 50 mg by mouth  daily.      0   . NEXIUM 40 MG capsule   Oral   Take 40 mg by mouth daily.      2     Dispense as written.   . sertraline (ZOLOFT) 25 MG tablet   Oral   Take 25 mg by mouth every evening.      2   . sucralfate (CARAFATE) 1 G tablet   Oral   Take 1 g by mouth 4 (four) times daily.       0   . tiZANidine (ZANAFLEX) 2 MG tablet   Oral   Take 2 mg by mouth 2 (two) times daily.      0     Allergies Review of patient's allergies indicates no known allergies.  History reviewed. No pertinent family history.  Social History Social History  Substance Use Topics  . Smoking status: Never Smoker   . Smokeless tobacco: Never Used  . Alcohol Use: No    Review of Systems  Constitutional: Negative for fever. ENT: Negative for sore throat. Cardiovascular: Negative for chest pain. Respiratory: Negative for cough. Gastrointestinal: Positive for approximately 6 days of lower abdominal pain. Genitourinary: Negative for dysuria. Musculoskeletal: No myalgias or injuries. Skin: Negative for rash. Neurological: Negative for headache or focal weakness  10-point ROS otherwise negative.  ____________________________________________   PHYSICAL EXAM:  VITAL SIGNS: ED Triage Vitals  Enc Vitals Group     BP 03/17/15 1504 173/67 mmHg     Pulse Rate 03/17/15 1504 79     Resp 03/17/15 1504 16     Temp 03/17/15 1504 98.9 F (37.2 C)     Temp Source 03/17/15 1504 Oral     SpO2 03/17/15 1504 97 %     Weight 03/17/15 1504 119 lb (53.978 kg)     Height 03/17/15 1504 5\' 7"  (1.702 m)     Head Cir --      Peak Flow --      Pain Score --      Pain Loc --      Pain Edu? --      Excl. in Scenic? --     Constitutional:  Alert, communicative. No distress. ENT   Head: Normocephalic and atraumatic.   Nose: No congestion/rhinnorhea.       Mouth: No erythema, no swelling   Cardiovascular: Normal rate, regular rhythm, no murmur noted Respiratory:  Normal respiratory effort, no  tachypnea.    Breath sounds are clear and equal bilaterally.  Gastrointestinal: Soft. Minimal distention. Mild discomfort in the abdomen.  Back: No muscle spasm, no tenderness, no CVA tenderness. Musculoskeletal: No deformity noted. Nontender with normal range of motion in all extremities.  No noted edema. Neurologic:  Normal appearing spontaneous movement in all 4 extremities. No gross focal neurologic deficits are appreciated.  Skin:  Skin is warm, dry. No rash noted. Psychiatric: Patient is interactive. She appears somewhat stoic. She is able to provide reliable answers, but much of the history comes from the other person with her. ____________________________________________    LABS (pertinent positives/negatives)  Labs Reviewed  CBC WITH DIFFERENTIAL/PLATELET - Abnormal; Notable for the following:    RBC 3.60 (*)    Hemoglobin 9.3 (*)    HCT 29.3 (*)    MCHC 31.9 (*)    RDW 15.6 (*)    All other components within normal limits  COMPREHENSIVE METABOLIC PANEL - Abnormal; Notable for the following:    Glucose, Bld 121 (*)    Creatinine, Ser 1.12 (*)    ALT 8 (*)    Total Bilirubin 0.2 (*)    GFR calc non Af Amer 41 (*)    GFR calc Af Amer 48 (*)    All other components within normal limits  TYPE AND SCREEN     ____________________________________________   RADIOLOGY  No imaging in the emergency department. CT report from [facility] reviewed. Dilatation of the appendix without definitive appendiceal inflammation, free air, or abscess.  ____________________________________________  EKG  ED ECG REPORT I, Porschia Willbanks W, the attending physician, personally viewed and interpreted this ECG.   Date: 03/17/2015  EKG Time: 1506  Rate: 78   Rhythm: Normal sinus rhythm  Axis: Normal  Intervals: Normal  ST&T Change: None noted  ____________________________________________   INITIAL IMPRESSION / ASSESSMENT AND PLAN / ED COURSE  Pertinent labs & imaging results  that were available during my care of the patient were reviewed by me and considered in my medical decision making (see chart for details).  The patient is overall well-appearing and stable, no acute distress. At this time, I am not sure if the patient definitively has appendicitis. I agree with the referral to the emergency department for further surgical evaluation.  I have called Dr. Waymon Amato, Dr. Jamal Collin colleague, and discussed the  patient's situation and the CT report. He will discuss this with Dr. Jamal Collin and either he or Dr. Jamal Collin will see the patient emergency department.  ----------------------------------------- 5:38 PM on 03/17/2015 -----------------------------------------  Patient has been seen by Dr. Jamal Collin. He will admit the patient to the hospital for observation and further evaluation.  ____________________________________________   FINAL CLINICAL IMPRESSION(S) / ED DIAGNOSES  Final diagnoses:  Lower abdominal pain   dilated appendix on CT    Ahmed Prima, MD 03/17/15 [phone]

## 2015-03-17 NOTE — ED Notes (Signed)
Surgeon at bedside.  

## 2015-03-17 NOTE — H&P (Signed)
[name] is an 81 y.o. female.   Chief Complaint:abdominal pain HPI: 81 yr old female with 5-6 days of lower abdominal pain. According to her daughter-in-law pain has mostly been in rlq area. No bowel changes, no n/v. No fever or chills. 7 mos ago she lap/cholecystectomy with good results. Prior to that she had a nondisplaced C 2 fracture which has healed fully.  Past Medical History  Diagnosis Date  . Hypertension   . Arthritis   . GERD (gastroesophageal reflux disease)   . Neck fracture ([place]) 2015    C2    Past Surgical History  Procedure Laterality Date  . Abdominal hysterectomy    . Eye surgery  2015    cataract  . Cholecystectomy  08/06/14    History reviewed. No pertinent family history. Social History:  reports that she has never smoked. She has never used smokeless tobacco. She reports that she does not drink alcohol or use illicit drugs.  Allergies: No Known Allergies   (Not in a hospital admission)  Results for orders placed or performed during the hospital encounter of 03/17/15 (from the past 48 hour(s))  CBC WITH DIFFERENTIAL     Status: Abnormal   Collection Time: 03/17/15  3:08 PM  Result Value Ref Range   WBC 7.8 3.6 - 11.0 K/uL   RBC 3.60 (L) 3.80 - 5.20 MIL/uL   Hemoglobin 9.3 (L) 12.0 - 16.0 g/dL   HCT 29.3 (L) 35.0 - 47.0 %   MCV 81.3 80.0 - 100.0 fL   MCH 26.0 26.0 - 34.0 pg   MCHC 31.9 (L) 32.0 - 36.0 g/dL   RDW 15.6 (H) 11.5 - 14.5 %   Platelets 407 150 - 440 K/uL   Neutrophils Relative % 71 %   Neutro Abs 5.4 1.4 - 6.5 K/uL   Lymphocytes Relative 22 %   Lymphs Abs 1.7 1.0 - 3.6 K/uL   Monocytes Relative 6 %   Monocytes Absolute 0.5 0.2 - 0.9 K/uL   Eosinophils Relative 0 %   Eosinophils Absolute 0.0 0 - 0.7 K/uL   Basophils Relative 1 %   Basophils Absolute 0.1 0 - 0.1 K/uL  Comprehensive metabolic panel     Status: Abnormal   Collection Time: 03/17/15  3:08 PM  Result Value Ref Range   Sodium 137 135 - 145 mmol/L   Potassium 3.9 3.5  - 5.1 mmol/L   Chloride 101 101 - 111 mmol/L   CO2 27 22 - 32 mmol/L   Glucose, Bld 121 (H) 65 - 99 mg/dL   BUN 16 6 - 20 mg/dL   Creatinine, Ser 1.12 (H) 0.44 - 1.00 mg/dL   Calcium 9.2 8.9 - 10.3 mg/dL   Total Protein 8.0 6.5 - 8.1 g/dL   Albumin 3.7 3.5 - 5.0 g/dL   AST 17 15 - 41 U/L   ALT 8 (L) 14 - 54 U/L   Alkaline Phosphatase 62 38 - 126 U/L   Total Bilirubin 0.2 (L) 0.3 - 1.2 mg/dL   GFR calc non Af Amer 41 (L) >60 mL/min   GFR calc Af Amer 48 (L) >60 mL/min    Comment: (NOTE) The eGFR has been calculated using the CKD EPI equation. This calculation has not been validated in all clinical situations. eGFR's persistently <60 mL/min signify possible Chronic Kidney Disease.    Anion gap 9 5 - 15  Type and screen [place]     Status: None   Collection Time: 03/17/15  3:08 PM  Result Value Ref Range   ABO/RH(D) B POS    Antibody Screen NEG    Sample Expiration 03/20/2015    No results found.  Review of Systems  Constitutional: Negative.   HENT: Negative.   Respiratory: Negative.   Cardiovascular: Negative.   Gastrointestinal: Positive for abdominal pain. Negative for heartburn, nausea, vomiting and constipation.  Genitourinary: Negative.   Skin: Negative.     Blood pressure 173/67, pulse 79, temperature 98.9 F (37.2 C), temperature source Oral, resp. rate 16, height $RemoveBe'5\' 7"'ICiXQPSGU$  (1.702 m), weight 119 lb (53.978 kg), SpO2 97 %. Physical Exam  Constitutional: She is oriented to person, place, and time. She appears well-developed and well-nourished.  Eyes: Conjunctivae are normal. No scleral icterus.  Neck: Neck supple.  Cardiovascular: Normal rate, regular rhythm and normal heart sounds.   Respiratory: Effort normal and breath sounds normal.  GI: Soft. There is no hepatomegaly. There is tenderness (mild lower abdominal tenderness, not well localised). There is no rebound and no guarding. No hernia.  Lymphadenopathy:    She has no cervical  adenopathy.       Right: No inguinal adenopathy present.       Left: No inguinal adenopathy present.  Neurological: She is alert and oriented to person, place, and time.     Assessment/Plan Pt had CT done in PCP's office- there is mild dilatation of appendix but no inflammatory changes. No other findings to account for her pain.She has normal labs and does not appear to be acutely ill. Plan for IV antibiotics and follow course. Pt is agreeable with the plan  SANKAR,SEEPLAPUTHUR G 03/17/2015, 5:08 PM

## 2015-03-17 NOTE — ED Notes (Signed)
Per Dr. Jamal Collin, ordered consult to hospitalist to help with admission of pt. Surgical unit cannot take pt with SBP > 180. Although orders in system for BP medication, floor requires PRN order to give to pt to bring BP to acceptable level for transfer to floor.

## 2015-03-17 NOTE — ED Notes (Signed)
Patient went to [facility] for right lower quadrant pain.  CT of abdomen today reccommended surgical intervention for appendicitis.

## 2015-03-18 ENCOUNTER — Encounter: Payer: Self-pay | Admitting: Internal Medicine

## 2015-03-18 LAB — BASIC METABOLIC PANEL
Anion gap: 4 — ABNORMAL LOW (ref 5–15)
BUN: 13 mg/dL (ref 6–20)
CO2: 26 mmol/L (ref 22–32)
Calcium: 8.6 mg/dL — ABNORMAL LOW (ref 8.9–10.3)
Chloride: 109 mmol/L (ref 101–111)
Creatinine, Ser: 1.16 mg/dL — ABNORMAL HIGH (ref 0.44–1.00)
GFR calc Af Amer: 46 mL/min — ABNORMAL LOW (ref >60)
GFR calc non Af Amer: 40 mL/min — ABNORMAL LOW (ref >60)
Glucose, Bld: 113 mg/dL — ABNORMAL HIGH (ref 65–99)
Potassium: 3.4 mmol/L — ABNORMAL LOW (ref 3.5–5.1)
Sodium: 139 mmol/L (ref 135–145)

## 2015-03-18 LAB — CBC
HCT: 26.9 % — ABNORMAL LOW (ref 35.0–47.0)
Hemoglobin: 8.7 g/dL — ABNORMAL LOW (ref 12.0–16.0)
MCH: 26.2 pg (ref 26.0–34.0)
MCHC: 32.3 g/dL (ref 32.0–36.0)
MCV: 81 fL (ref 80.0–100.0)
Platelets: 358 10*3/uL (ref 150–440)
RBC: 3.32 MIL/uL — ABNORMAL LOW (ref 3.80–5.20)
RDW: 15.9 % — ABNORMAL HIGH (ref 11.5–14.5)
WBC: 5.6 10*3/uL (ref 3.6–11.0)

## 2015-03-18 NOTE — Progress Notes (Signed)
Patient ID: [name], female   DOB: 1921-01-07, 81 y.o.   MRN: [id number] Patient says abdominal pain is better.No n/v. Afebrile, vss. Abdomen is soft, no tnderness today. WBC normal. Mild renal insufficiency which is not new.  Hgb is low at 8.7. Will monitor. Abdominal pain, possibly from appendix- not well explained by CT finding. Appears to be better today. Will start clear liqs. If stable , will discharge home on oral antibiotics tomorrow.

## 2015-03-18 NOTE — Consult Note (Signed)
[place] [facility] [place] NAME: [name]    MR#:  [id number]  DATE OF BIRTH:  12/23/1920  DATE OF ADMISSION:  03/17/2015  PRIMARY CARE PHYSICIAN: Volanda Napoleon, MD   REQUESTING/REFERRING PHYSICIAN: Dr. Jamal Collin  CHIEF COMPLAINT:   Chief Complaint  Patient presents with  . Abdominal Pain    HISTORY OF PRESENT ILLNESS:  [name]  is a 81 y.o. female with a known history of hypertension, gastroesophageal reflux disease and arthritis. She presented with abdominal pain. Her abdominal pain was severe on presentation. Now it's not too bad it comes and goes and is crampy in nature. No complaints of nausea or vomiting. She did have diarrhea after drinking the CAT scan contrast. She was admitted by the surgical service with abdominal pain concerning for appendicitis and started on IV antibiotics. Medical consult was called for hypertension.   PAST MEDICAL HISTORY:   Past Medical History  Diagnosis Date  . Hypertension   . Arthritis   . GERD (gastroesophageal reflux disease)   . Neck fracture ([place]) 2015    C2    PAST SURGICAL HISTOIRY:   Past Surgical History  Procedure Laterality Date  . Abdominal hysterectomy    . Eye surgery  2015    cataract  . Cholecystectomy  08/06/14    SOCIAL HISTORY:   Social History  Substance Use Topics  . Smoking status: Never Smoker   . Smokeless tobacco: Never Used  . Alcohol Use: No    FAMILY HISTORY:   Family History  Problem Relation Age of Onset  . Rheumatologic disease Mother     DRUG ALLERGIES:  No Known Allergies  REVIEW OF SYSTEMS:  CONSTITUTIONAL: No fever, fatigue or weakness.  EYES: Wears glasses, can't see that well out of the right eye EARS, NOSE, AND THROAT: No tinnitus or ear pain.  RESPIRATORY: No cough, shortness of breath, wheezing or hemoptysis.  CARDIOVASCULAR: No chest pain, orthopnea, edema.  GASTROINTESTINAL: No nausea, vomiting, diarrhea. Positive  for abdominal pain.  GENITOURINARY: No dysuria, hematuria.  ENDOCRINE: No polyuria, nocturia,  HEMATOLOGY: No anemia, easy bruising or bleeding SKIN: No rash or lesion. MUSCULOSKELETAL: No joint pain or arthritis.   NEUROLOGIC: No tingling, numbness, weakness.  PSYCHIATRY: No anxiety or depression.   MEDICATIONS AT HOME:   Prior to Admission medications   Medication Sig Start Date End Date Taking? Authorizing Provider  allopurinol (ZYLOPRIM) 300 MG tablet Take 300 mg by mouth daily.   Yes Historical Provider, MD  ciprofloxacin (CIPRO) 500 MG tablet Take 500 mg by mouth 2 (two) times daily. 03/16/15 03/26/15 Yes Historical Provider, MD  diclofenac sodium (VOLTAREN) 1 % GEL Apply 2 g topically 2 (two) times daily.   Yes Historical Provider, MD  docusate sodium (COLACE) 100 MG capsule Take 100 mg by mouth 2 (two) times daily.   Yes Historical Provider, MD  esomeprazole (NEXIUM) 40 MG capsule Take 40 mg by mouth daily.    Yes Historical Provider, MD  ferrous sulfate 325 (65 FE) MG tablet Take 325 mg by mouth daily.   Yes Historical Provider, MD  hydrALAZINE (APRESOLINE) 50 MG tablet Take 50 mg by mouth 3 (three) times daily.   Yes Historical Provider, MD  HYDROcodone-acetaminophen (NORCO/VICODIN) 5-325 MG tablet Take 1 tablet by mouth 2 (two) times daily as needed for moderate pain.   Yes Historical Provider, MD  metoprolol tartrate (LOPRESSOR) 25 MG tablet Take 25 mg by mouth 2 (two) times daily.  Yes Historical Provider, MD  metroNIDAZOLE (FLAGYL) 500 MG tablet Take 500 mg by mouth 3 (three) times daily. 03/16/15 03/26/15 Yes Historical Provider, MD  sertraline (ZOLOFT) 25 MG tablet Take 25 mg by mouth every evening.    Yes Historical Provider, MD  tiZANidine (ZANAFLEX) 2 MG tablet Take 2 mg by mouth 2 (two) times daily.   Yes Historical Provider, MD      VITAL SIGNS:  Blood pressure 146/61, pulse 62, temperature 98.7 F (37.1 C), temperature source Oral, resp. rate 16, height 5\' 7"  (1.702  m), weight 49.624 kg (109 lb 6.4 oz), SpO2 99 %.  PHYSICAL EXAMINATION:  GENERAL:  81 y.o.-year-old patient lying in the bed with no acute distress.  EYES: Right pupil clouded over. Left pupil reactive to light. No scleral icterus. Extraocular muscles intact.  HEENT: Head atraumatic, normocephalic. Oropharynx and nasopharynx clear.  NECK:  Supple, no jugular venous distention. No thyroid enlargement, no tenderness.  LUNGS: Normal breath sounds bilaterally, no wheezing, rales,rhonchi or crepitation. No use of accessory muscles of respiration.  CARDIOVASCULAR: S1, S2 normal. No murmurs, rubs, or gallops.  ABDOMEN: Soft, slight generalized tenderness, nondistended. Bowel sounds present. No organomegaly or mass.  EXTREMITIES: No pedal edema, cyanosis, or clubbing.  NEUROLOGIC: Cranial nerves II through XII are intact. Muscle strength 5/5 in all extremities. Sensation intact. Gait not checked.  PSYCHIATRIC: The patient is alert and oriented x 3.  SKIN: Chronic lower extremity discoloration  LABORATORY PANEL:   CBC  Recent Labs Lab 03/18/15 0501  WBC 5.6  HGB 8.7*  HCT 26.9*  PLT 358   ------------------------------------------------------------------------------------------------------------------  Chemistries   Recent Labs Lab 03/17/15 1508 03/18/15 0501  NA 137 139  K 3.9 3.4*  CL 101 109  CO2 27 26  GLUCOSE 121* 113*  BUN 16 13  CREATININE 1.12* 1.16*  CALCIUM 9.2 8.6*  AST 17  --   ALT 8*  --   ALKPHOS 62  --   BILITOT 0.2*  --    ------------------------------------------------------------------------------------------------------------------ -------------------------------------------------------------------------------------------------------------   IMPRESSION AND PLAN:   1. Accelerated hypertension on presentation likely secondary to pain. Continue oral medications and monitor. Decrease the rate of the IV fluids. 2. Abdominal pain, generalized- surgery has  on antibiotics and potential discharge home tomorrow. 3. Anemia, likely of chronic disease- decrease rate of IV fluids. Hemoglobin ordered for tomorrow. 4. Chronic kidney disease stage III-  stable from previous labs 5. Hypokalemia- replace orally 6. History of arthritis  All the records are reviewed. Management plans discussed with the patient, and nursing staff and they are in agreement.  CODE STATUS: Listed as full code by admitting physician  TOTAL TIME TAKING CARE OF THIS PATIENT: 50 minutes.    Loletha Grayer M.D on 03/18/2015 at 2:36 PM  Between 7am to 6pm - Pager - [phone]  After 6pm go to [email or web address] - password [email or web address] [facility]  Office  [phone]  CC: Primary care Physician: Volanda Napoleon, MD

## 2015-03-19 LAB — HEMOGLOBIN AND HEMATOCRIT, BLOOD
HCT: 27.8 % — ABNORMAL LOW (ref 35.0–47.0)
Hemoglobin: 9.1 g/dL — ABNORMAL LOW (ref 12.0–16.0)

## 2015-03-19 NOTE — Progress Notes (Signed)
03/19/2015 10:51 AM  BP 159/83 mmHg  Pulse 61  Temp(Src) 98.6 F (37 C) (Oral)  Resp 16  Ht 5\' 7"  (1.702 m)  Wt 49.624 kg (109 lb 6.4 oz)  BMI 17.13 kg/m2  SpO2 100% Rested comfortably throughout shift with no complaints of pain. Patient discharged per MD orders. Discharge instructions reviewed with patient/family and both verbalized understanding. IV removed per policy. Prescriptions discussed with patient. Discharged via wheelchair escorted by auxilary.  Almedia Balls, RN

## 2015-03-19 NOTE — Progress Notes (Signed)
ANTIBIOTIC CONSULT NOTE - FOLLOW UP   Pharmacy Consult for Ertapenem Indication: Abdominal pain  No Known Allergies  Patient Measurements: Height: 5\' 7"  (170.2 cm) Weight: 109 lb 6.4 oz (49.624 kg) IBW/kg (Calculated) : 61.6  Vital Signs: Temp: 98.6 F (37 C) (11/04 0515) Temp Source: Oral (11/04 0515) BP: 159/83 mmHg (11/04 0515) Pulse Rate: 61 (11/04 0515) Intake/Output from previous day: 11/03 0701 - 11/04 0700 In: 1903.7 [P.O.:720; I.V.:1183.7] Out: 100 [Urine:100] Intake/Output from this shift:    Labs:  Recent Labs  03/17/15 1508 03/18/15 0501 03/19/15 0523  WBC 7.8 5.6  --   HGB 9.3* 8.7* 9.1*  PLT 407 358  --   CREATININE 1.12* 1.16*  --    Estimated Creatinine Clearance: 24.2 mL/min (by C-G formula based on Cr of 1.16). No results for input(s): VANCOTROUGH, VANCOPEAK, VANCORANDOM, GENTTROUGH, GENTPEAK, GENTRANDOM, TOBRATROUGH, TOBRAPEAK, TOBRARND, AMIKACINPEAK, AMIKACINTROU, AMIKACIN in the last 72 hours.   Microbiology: No results found for this or any previous visit (from the past 720 hour(s)).  Medical History: Past Medical History  Diagnosis Date  . Hypertension   . Arthritis   . GERD (gastroesophageal reflux disease)   . Neck fracture ([place]) 2015    C2    Medications:  Scheduled:  . allopurinol  300 mg Oral Daily  . diclofenac sodium  2 g Topical BID  . ertapenem  500 mg Intravenous Q24H  . ferrous sulfate  325 mg Oral Daily  . hydrALAZINE  50 mg Oral TID  . metoprolol tartrate  25 mg Oral BID  . pantoprazole  40 mg Oral QAC breakfast  . sertraline  25 mg Oral QPM  . tiZANidine  2 mg Oral BID   Infusions:  . dextrose 5 % and 0.45% NaCl 30 mL/hr at 03/18/15 1710   Assessment: 81 y/o F admitted with abdominal pain ordered ertapenem per surgery.   Plan:  Will continue ertapenem 500 mg iv q 24 hours. Will continue to follow renal function and culture results.   Mckensi Redinger D 03/19/2015,7:44 AM

## 2015-03-19 NOTE — Discharge Summary (Signed)
Physician Discharge Summary  Patient ID: [name] MRN: [id number] DOB/AGE: 1920/10/22 81 y.o.  Admit date: 03/17/2015 Discharge date: 03/19/2015  Admission Diagnoses:  Abdominal pain  Discharge Diagnoses: Abdominal pain Active Problems:   Abdominal pain   Discharged Condition: good  Hospital Course: This 81 year old female presented with a 5-6 day history of persistent lower abdominal pain more so on the right than the left side. She was initially evaluated by her PCP. A CT scan was then performed on 03/17/2015 which revealed a mildly dilated appendix without any periappendiceal inflammation. The patient was sent to the emergency room and surgical consultation was obtained. Physical exam revealed only some mild nonfocal tenderness in the lower abdomen. Patient appeared to be otherwise in no acute situation. The CT findings were less than diagnostic. Her labs were normal except for mild elevation of creatinine and a low hemoglobin which had been chronic. It was decided to admit the patient for IV antibiotics and observation for 48 hours. The patient subsequent to her admission had no new complaints and abdominal pain is completely resolved. Patient is now tolerating clear liquid diet without any problem. Her blood pressure was initially high and consultation was obtained from internal medicine and and her blood pressure has been since normal. Patient is now being discharged on oral Cipro and Flagyl which she had initially started prior to admission. She is to follow-up in a week in my office.  Consults: hospitalist  Significant Diagnostic Studies: none  Treatments: antibiotics: Ertapenem  Discharge Exam: Blood pressure 159/83, pulse 61, temperature 98.6 F (37 C), temperature source Oral, resp. rate 16, height 5\' 7"  (1.702 m), weight 109 lb 6.4 oz (49.624 kg), SpO2 100 %. GI: soft, non-tender; bowel sounds normal; no masses,  no organomegaly  Disposition: Final discharge disposition  not confirmed      Discharge Instructions    Call MD for:  persistant nausea and vomiting    Complete by:  As directed      Call MD for:  severe uncontrolled pain    Complete by:  As directed      Call MD for:  temperature >100.4    Complete by:  As directed      Diet - low sodium heart healthy    Complete by:  As directed      Increase activity slowly    Complete by:  As directed             Medication List    TAKE these medications        allopurinol 300 MG tablet  Commonly known as:  ZYLOPRIM  Take 300 mg by mouth daily.     ciprofloxacin 500 MG tablet  Commonly known as:  CIPRO  Take 500 mg by mouth 2 (two) times daily.     diclofenac sodium 1 % Gel  Commonly known as:  VOLTAREN  Apply 2 g topically 2 (two) times daily.     docusate sodium 100 MG capsule  Commonly known as:  COLACE  Take 100 mg by mouth 2 (two) times daily.     esomeprazole 40 MG capsule  Commonly known as:  NEXIUM  Take 40 mg by mouth daily.     ferrous sulfate 325 (65 FE) MG tablet  Take 325 mg by mouth daily.     hydrALAZINE 50 MG tablet  Commonly known as:  APRESOLINE  Take 50 mg by mouth 3 (three) times daily.     HYDROcodone-acetaminophen 5-325 MG tablet  Commonly  known as:  NORCO/VICODIN  Take 1 tablet by mouth 2 (two) times daily as needed for moderate pain.     metoprolol tartrate 25 MG tablet  Commonly known as:  LOPRESSOR  Take 25 mg by mouth 2 (two) times daily.     metroNIDAZOLE 500 MG tablet  Commonly known as:  FLAGYL  Take 500 mg by mouth 3 (three) times daily.     sertraline 25 MG tablet  Commonly known as:  ZOLOFT  Take 25 mg by mouth every evening.     tiZANidine 2 MG tablet  Commonly known as:  ZANAFLEX  Take 2 mg by mouth 2 (two) times daily.         SignedChristene Lye 03/19/2015, 8:50 AM

## 2015-03-24 ENCOUNTER — Ambulatory Visit (INDEPENDENT_AMBULATORY_CARE_PROVIDER_SITE_OTHER): Payer: Medicare Other | Admitting: General Surgery

## 2015-03-24 ENCOUNTER — Encounter: Payer: Self-pay | Admitting: General Surgery

## 2015-03-24 VITALS — BP 146/86 | HR 72 | Resp 14 | Ht 66.5 in | Wt 120.0 lb

## 2015-03-24 DIAGNOSIS — K3580 Unspecified acute appendicitis: Secondary | ICD-10-CM

## 2015-03-24 NOTE — Patient Instructions (Signed)
The patient is aware to call back for any questions or concerns.  

## 2015-03-24 NOTE — Progress Notes (Signed)
Patient ID: [name], female   DOB: [birth year], 81 y.o.   MRN: [id number]  Chief Complaint  Patient presents with  . Follow-up    HPI [name] is a 81 y.o. female.  Here today for follow up from hospitalization for inflamed appendix. She was discharged on 03-19-15. No complaints and no nausea or vomiting. Good appetite. 3 days left on antibiotics. Here today with her daughter in law who is primary care giver. I have reviewed the history of present illness with the patient. HPI  Past Medical History  Diagnosis Date  . Hypertension   . Arthritis   . GERD (gastroesophageal reflux disease)   . Neck fracture ([place]) 2015    C2    Past Surgical History  Procedure Laterality Date  . Abdominal hysterectomy    . Eye surgery  2015    cataract  . Cholecystectomy  08/06/14    Family History  Problem Relation Age of Onset  . Rheumatologic disease Mother     Social History Social History  Substance Use Topics  . Smoking status: Never Smoker   . Smokeless tobacco: Never Used  . Alcohol Use: No    No Known Allergies  Current Outpatient Prescriptions  Medication Sig Dispense Refill  . allopurinol (ZYLOPRIM) 300 MG tablet Take 300 mg by mouth daily.    . diclofenac sodium (VOLTAREN) 1 % GEL Apply 2 g topically 2 (two) times daily.    Marland Kitchen docusate sodium (COLACE) 100 MG capsule Take 100 mg by mouth 2 (two) times daily.    Marland Kitchen esomeprazole (NEXIUM) 40 MG capsule Take 40 mg by mouth daily.     . ferrous sulfate 325 (65 FE) MG tablet Take 325 mg by mouth daily.    . hydrALAZINE (APRESOLINE) 50 MG tablet Take 50 mg by mouth 3 (three) times daily.    Marland Kitchen HYDROcodone-acetaminophen (NORCO/VICODIN) 5-325 MG tablet Take 1 tablet by mouth 2 (two) times daily as needed for moderate pain.    . metoprolol tartrate (LOPRESSOR) 25 MG tablet Take 25 mg by mouth 2 (two) times daily.    . sertraline (ZOLOFT) 25 MG tablet Take 25 mg by mouth every evening.     Marland Kitchen tiZANidine (ZANAFLEX) 2 MG tablet Take  2 mg by mouth 2 (two) times daily.     No current facility-administered medications for this visit.    Review of Systems Review of Systems  Constitutional: Negative.   Respiratory: Negative.   Cardiovascular: Negative.     Blood pressure 146/86, pulse 72, resp. rate 14, height 5' 6.5" (1.689 m), weight 120 lb (54.432 kg).  Physical Exam Physical Exam  Constitutional: She is oriented to person, place, and time. She appears well-developed and well-nourished.  HENT:  Head: Normocephalic.  Eyes: Conjunctivae are normal. No scleral icterus.  Neck: Neck supple.  Cardiovascular: Normal rate, regular rhythm and normal heart sounds.   Pulmonary/Chest: Effort normal.  Abdominal: Soft. Normal appearance and bowel sounds are normal. She exhibits no distension. There is no tenderness.  Neurological: She is alert and oriented to person, place, and time.  Skin: Skin is warm and dry.  Psychiatric: Her behavior is normal.    Data Reviewed Progress notes reviewed  Assessment    Abdominal pain with CT showing a mildly dilated appendix but no inflammatory changes. Pain has since resolved with administration of antibiotics.      Plan    Discussed management options with patient and caregiver, they are agreeable to observation only at  this time. If pain returns, will reevaluate plan.       PCP:  Aquilla Hacker 03/25/2015, 8:18 AM

## 2015-03-25 ENCOUNTER — Encounter: Payer: Self-pay | Admitting: General Surgery

## 2015-09-01 DIAGNOSIS — S12100A Unspecified displaced fracture of second cervical vertebra, initial encounter for closed fracture: Secondary | ICD-10-CM | POA: Insufficient documentation

## 2015-09-07 DIAGNOSIS — M1710 Unilateral primary osteoarthritis, unspecified knee: Secondary | ICD-10-CM | POA: Insufficient documentation

## 2015-09-07 DIAGNOSIS — M179 Osteoarthritis of knee, unspecified: Secondary | ICD-10-CM | POA: Insufficient documentation

## 2016-03-22 DIAGNOSIS — F329 Major depressive disorder, single episode, unspecified: Secondary | ICD-10-CM | POA: Insufficient documentation

## 2016-03-22 DIAGNOSIS — M13869 Other specified arthritis, unspecified knee: Secondary | ICD-10-CM | POA: Insufficient documentation

## 2016-03-22 DIAGNOSIS — F32A Depression, unspecified: Secondary | ICD-10-CM | POA: Insufficient documentation

## 2016-03-22 DIAGNOSIS — K219 Gastro-esophageal reflux disease without esophagitis: Secondary | ICD-10-CM | POA: Insufficient documentation

## 2016-05-24 IMAGING — CT CT CHEST-ABD-PELV W/ CM
2 of 5 series · 14 of 46 positions shown, 16 images · IV contrast (omnipaque)
Comparison: None.

CLINICAL DATA: Chest pressure and discomfort. Patient feels like
she needs to burp.

EXAM:
CT CHEST, ABDOMEN, AND PELVIS WITH CONTRAST
TECHNIQUE: Multidetector CT imaging of the chest, abdomen and pelvis was
performed following the standard protocol during bolus
administration of intravenous contrast.
CONTRAST:  85 mL Omnipaque 300

[Series 2: cap with · axial · 0.68mm/px · z∈[-552,-72]mm · 11 of 110 slices shown, 13 images]
[im 7/110  soft-tissue]
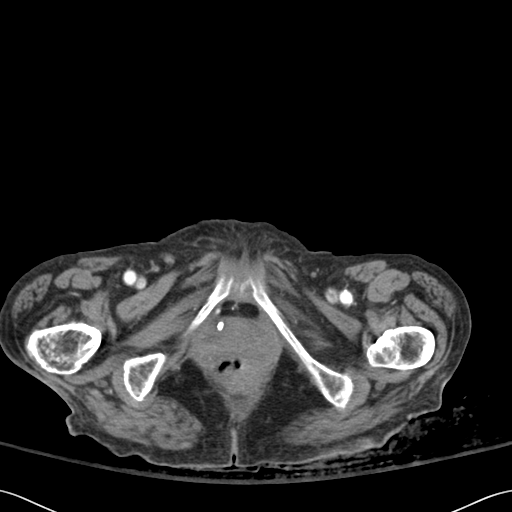
[im 7/110  bone]
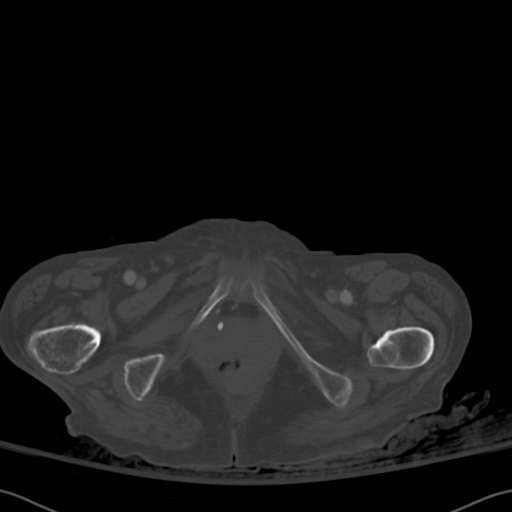
[im 19/110  soft-tissue]
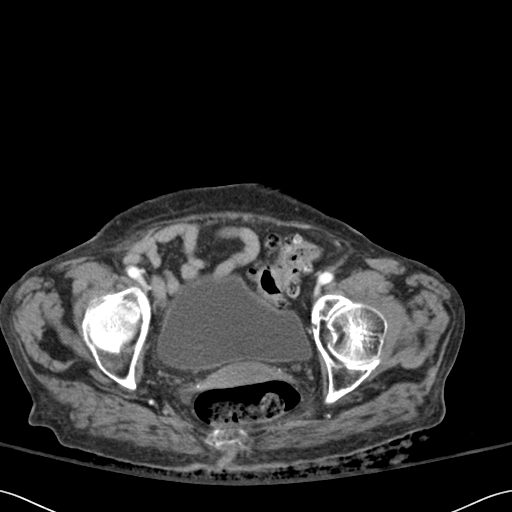
[im 25/110  soft-tissue]
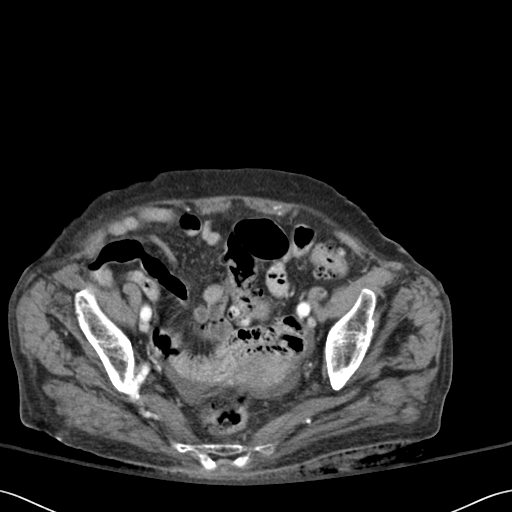
[im 37/110  soft-tissue]
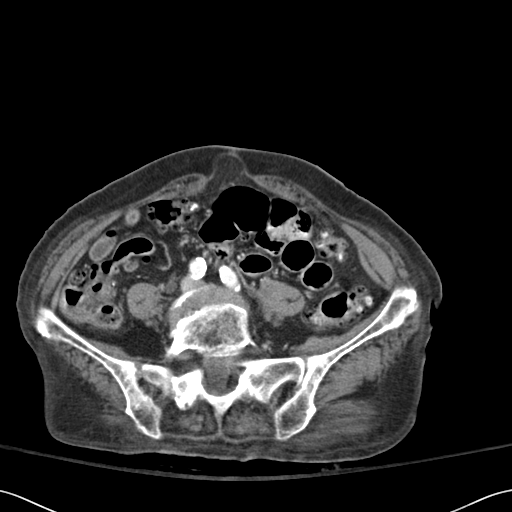
[im 43/110  soft-tissue]
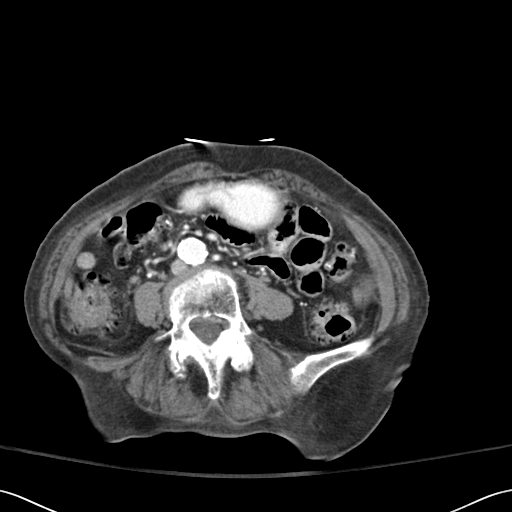
[im 55/110  soft-tissue]
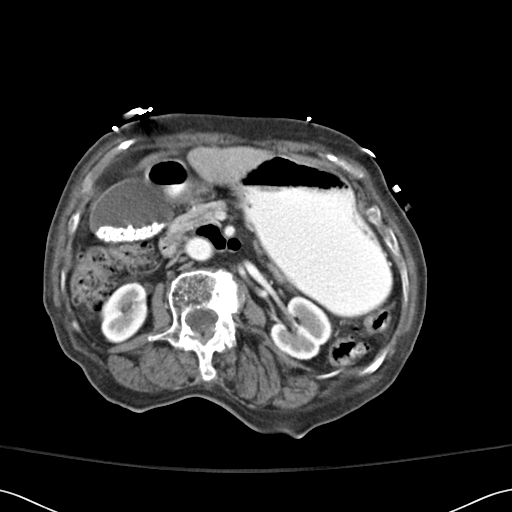
[im 67/110  soft-tissue]
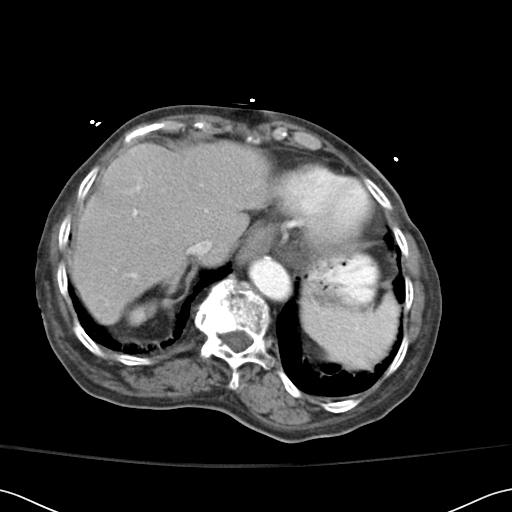
[im 73/110  soft-tissue]
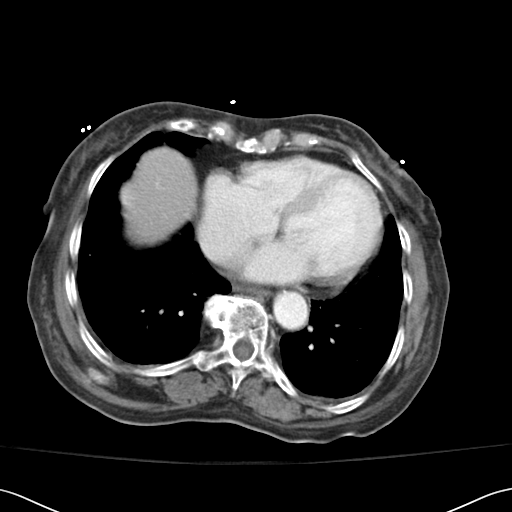
[im 85/110  soft-tissue]
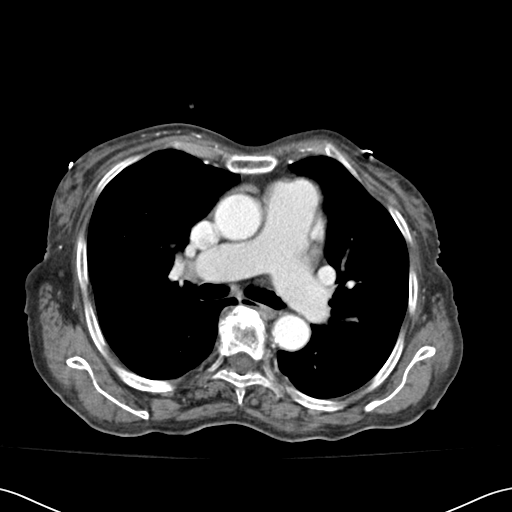
[im 85/110  bone]
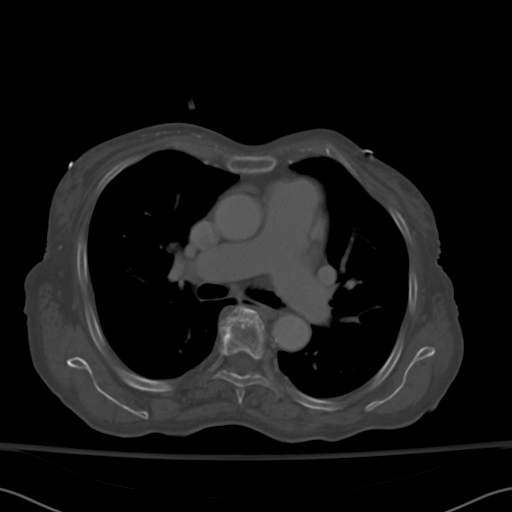
[im 91/110  soft-tissue]
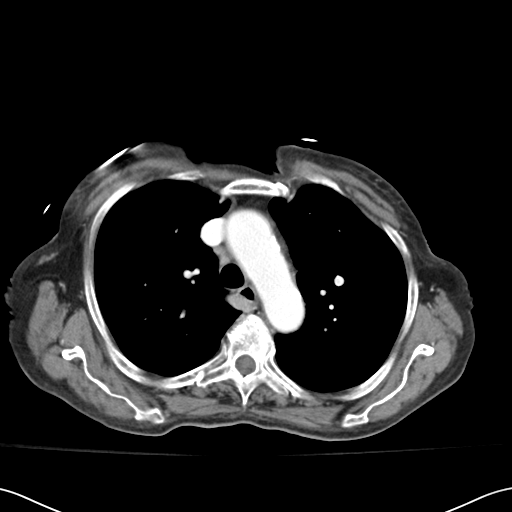
[im 103/110  soft-tissue]
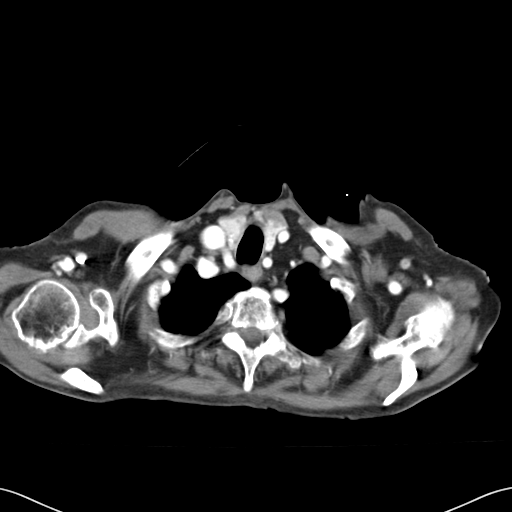

[Series 6: cor cap with · coronal · 0.63mm/px · 3 of 116 slices shown]
[im 39/116  soft-tissue]
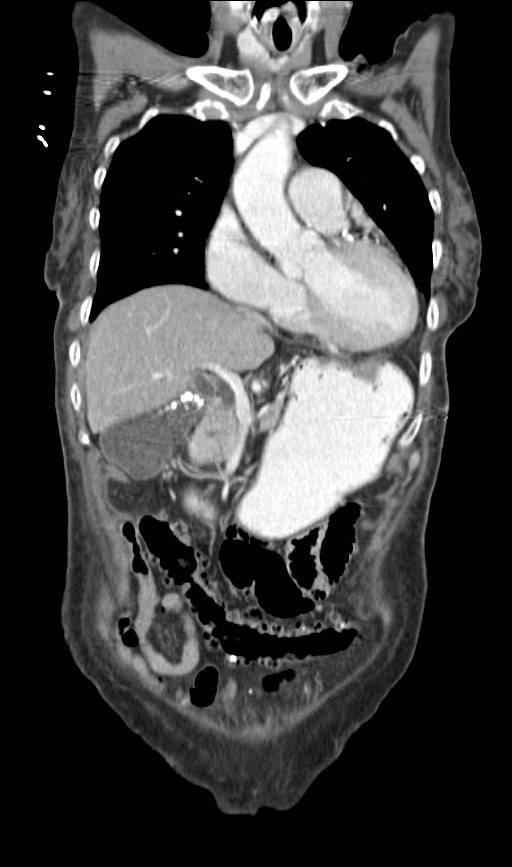
[im 52/116  soft-tissue]
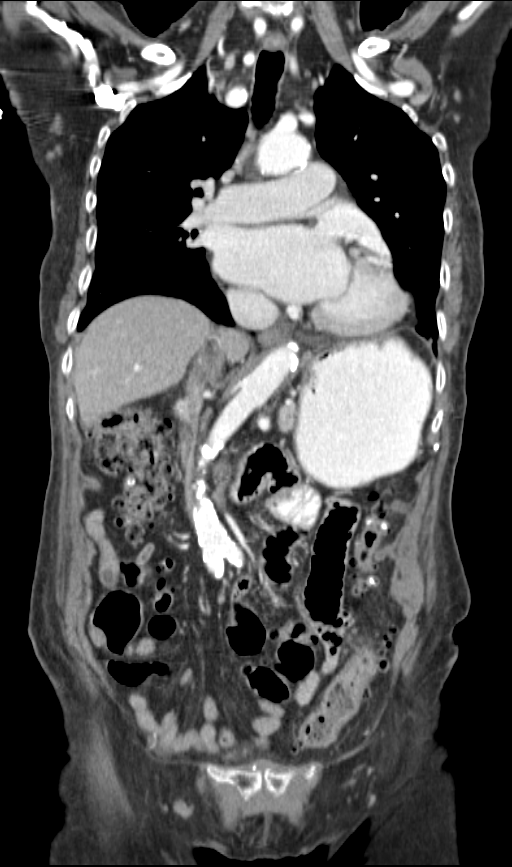
[im 64/116  soft-tissue]
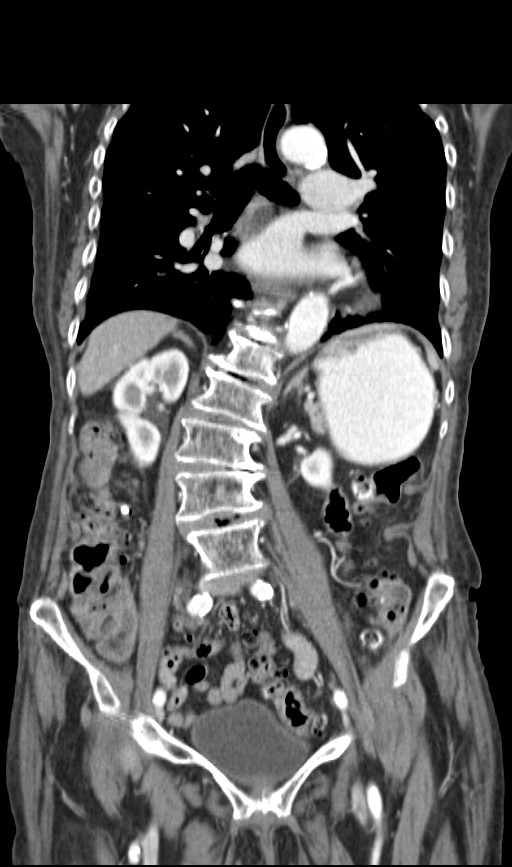

[14 of 46 positions shown; findings below may reference images not displayed]

FINDINGS: CT CHEST FINDINGS

Mild cardiac enlargement. Prominent central pulmonary vascularity.
Normal caliber thoracic aorta with scattered calcification. No
aneurysm or dissection. Great vessel origins are patent. Coronary
artery calcifications. Esophagus is decompressed. No significant
lymphadenopathy in the chest.

Scattered emphysematous changes in the lungs. Fibrosis or
atelectasis in the lung bases. No focal consolidation or airspace
disease. No pneumothorax. No pleural effusions.

CT ABDOMEN AND PELVIS FINDINGS

Gallbladder is mildly distended with cholelithiasis and sludge. No
gallbladder wall thickening or infiltration. No bile duct
dilatation. The liver, spleen, pancreas, adrenal glands, kidneys,
and retroperitoneal lymph nodes are unremarkable. Calcified and
tortuous aorta. No aneurysm. Inferior vena cava is decompressed
which may suggest hypovolemia. Stomach and small bowel appear normal
for degree of distention. Colon is decompressed but stool-filled
with diffuse diverticulosis throughout the colon. No specific
inflammatory changes are demonstrated. No free air or free fluid in
the abdomen. Umbilical hernia containing fat.

Pelvis: Small amount of free fluid in the pelvis. Uterus and ovaries
are not enlarged. Appendix is normal. Diverticulosis of the sigmoid
colon without evidence of diverticulitis. Bladder wall is not
thickened. No pelvic mass or lymphadenopathy.

Bones: Thoracolumbar scoliosis convex towards the right. Diffuse
degenerative changes throughout the spine. No destructive bone
lesions are appreciated.
IMPRESSION: Cardiac enlargement with prominent pulmonary vascularity. Mild
emphysematous changes in the lungs. No evidence of active pulmonary
disease.

Cholelithiasis with mild gallbladder distention. No additional
inflammatory changes. Diffuse colonic diverticulosis without
inflammation. Small amount of free fluid in the pelvis is
nonspecific. Small umbilical hernia containing fat.

## 2016-08-01 ENCOUNTER — Inpatient Hospital Stay (HOSPITAL_COMMUNITY)
Admission: AD | Admit: 2016-08-01 | Discharge: 2016-08-03 | DRG: 812 | Disposition: A | Discharge status: Home / Self Care | Payer: Medicare Other | Source: Other Acute Inpatient Hospital | Attending: Internal Medicine | Admitting: Internal Medicine

## 2016-08-01 ENCOUNTER — Encounter: Payer: Self-pay | Admitting: Emergency Medicine

## 2016-08-01 ENCOUNTER — Emergency Department: Payer: Medicare Other

## 2016-08-01 ENCOUNTER — Emergency Department
Admission: EM | Admit: 2016-08-01 | Discharge: 2016-08-01 | Payer: Medicare Other | Attending: Emergency Medicine | Admitting: Emergency Medicine

## 2016-08-01 DIAGNOSIS — D649 Anemia, unspecified: Secondary | ICD-10-CM

## 2016-08-01 DIAGNOSIS — Z79899 Other long term (current) drug therapy: Secondary | ICD-10-CM | POA: Diagnosis not present

## 2016-08-01 DIAGNOSIS — Z90710 Acquired absence of both cervix and uterus: Secondary | ICD-10-CM | POA: Diagnosis not present

## 2016-08-01 DIAGNOSIS — M1990 Unspecified osteoarthritis, unspecified site: Secondary | ICD-10-CM | POA: Diagnosis present

## 2016-08-01 DIAGNOSIS — M109 Gout, unspecified: Secondary | ICD-10-CM | POA: Diagnosis present

## 2016-08-01 DIAGNOSIS — K921 Melena: Secondary | ICD-10-CM | POA: Diagnosis present

## 2016-08-01 DIAGNOSIS — N289 Disorder of kidney and ureter, unspecified: Secondary | ICD-10-CM

## 2016-08-01 DIAGNOSIS — I10 Essential (primary) hypertension: Secondary | ICD-10-CM | POA: Diagnosis present

## 2016-08-01 DIAGNOSIS — K922 Gastrointestinal hemorrhage, unspecified: Secondary | ICD-10-CM | POA: Insufficient documentation

## 2016-08-01 DIAGNOSIS — D62 Acute posthemorrhagic anemia: Secondary | ICD-10-CM | POA: Diagnosis present

## 2016-08-01 DIAGNOSIS — D509 Iron deficiency anemia, unspecified: Secondary | ICD-10-CM | POA: Diagnosis present

## 2016-08-01 DIAGNOSIS — T39395A Adverse effect of other nonsteroidal anti-inflammatory drugs [NSAID], initial encounter: Secondary | ICD-10-CM | POA: Diagnosis present

## 2016-08-01 DIAGNOSIS — F329 Major depressive disorder, single episode, unspecified: Secondary | ICD-10-CM | POA: Diagnosis present

## 2016-08-01 DIAGNOSIS — R110 Nausea: Secondary | ICD-10-CM

## 2016-08-01 DIAGNOSIS — K449 Diaphragmatic hernia without obstruction or gangrene: Secondary | ICD-10-CM | POA: Diagnosis present

## 2016-08-01 DIAGNOSIS — Z791 Long term (current) use of non-steroidal anti-inflammatories (NSAID): Secondary | ICD-10-CM | POA: Diagnosis not present

## 2016-08-01 DIAGNOSIS — K219 Gastro-esophageal reflux disease without esophagitis: Secondary | ICD-10-CM | POA: Diagnosis present

## 2016-08-01 LAB — URINALYSIS, COMPLETE (UACMP) WITH MICROSCOPIC
Bacteria, UA: NONE SEEN
Bilirubin Urine: NEGATIVE
Glucose, UA: NEGATIVE mg/dL
Hgb urine dipstick: NEGATIVE
Ketones, ur: NEGATIVE mg/dL
Leukocytes, UA: NEGATIVE
Nitrite: NEGATIVE
Protein, ur: NEGATIVE mg/dL
RBC / HPF: NONE SEEN RBC/hpf (ref 0–5)
Specific Gravity, Urine: 1.01 (ref 1.005–1.030)
pH: 6 (ref 5.0–8.0)

## 2016-08-01 LAB — CBC
HCT: 20.3 % — ABNORMAL LOW (ref 35.0–47.0)
HCT: 21.9 % — ABNORMAL LOW (ref 35.0–47.0)
HCT: 19.8 % — ABNORMAL LOW (ref 35.0–47.0)
Hemoglobin: 6.5 g/dL — ABNORMAL LOW (ref 12.0–16.0)
Hemoglobin: 7 g/dL — ABNORMAL LOW (ref 12.0–16.0)
Hemoglobin: 6.2 g/dL — ABNORMAL LOW (ref 12.0–16.0)
MCH: 25.6 pg — ABNORMAL LOW (ref 26.0–34.0)
MCH: 25.6 pg — ABNORMAL LOW (ref 26.0–34.0)
MCH: 24.6 pg — ABNORMAL LOW (ref 26.0–34.0)
MCHC: 32.2 g/dL (ref 32.0–36.0)
MCHC: 31.8 g/dL — ABNORMAL LOW (ref 32.0–36.0)
MCHC: 31.2 g/dL — ABNORMAL LOW (ref 32.0–36.0)
MCV: 79.6 fL — ABNORMAL LOW (ref 80.0–100.0)
MCV: 80.4 fL (ref 80.0–100.0)
MCV: 79 fL — ABNORMAL LOW (ref 80.0–100.0)
Platelets: 369 10*3/uL (ref 150–440)
Platelets: 419 10*3/uL (ref 150–440)
Platelets: 344 10*3/uL (ref 150–440)
RBC: 2.55 MIL/uL — ABNORMAL LOW (ref 3.80–5.20)
RBC: 2.73 MIL/uL — ABNORMAL LOW (ref 3.80–5.20)
RBC: 2.51 MIL/uL — ABNORMAL LOW (ref 3.80–5.20)
RDW: 16.8 % — ABNORMAL HIGH (ref 11.5–14.5)
RDW: 17.4 % — ABNORMAL HIGH (ref 11.5–14.5)
RDW: 17 % — ABNORMAL HIGH (ref 11.5–14.5)
WBC: 7 10*3/uL (ref 3.6–11.0)
WBC: 7.8 10*3/uL (ref 3.6–11.0)
WBC: 6.8 10*3/uL (ref 3.6–11.0)

## 2016-08-01 LAB — COMPREHENSIVE METABOLIC PANEL
ALT: 9 U/L — ABNORMAL LOW (ref 14–54)
AST: 16 U/L (ref 15–41)
Albumin: 3.6 g/dL (ref 3.5–5.0)
Alkaline Phosphatase: 64 U/L (ref 38–126)
Anion gap: 5 (ref 5–15)
BUN: 21 mg/dL — ABNORMAL HIGH (ref 6–20)
CO2: 24 mmol/L (ref 22–32)
Calcium: 8.7 mg/dL — ABNORMAL LOW (ref 8.9–10.3)
Chloride: 113 mmol/L — ABNORMAL HIGH (ref 101–111)
Creatinine, Ser: 1.69 mg/dL — ABNORMAL HIGH (ref 0.44–1.00)
GFR calc Af Amer: 29 mL/min — ABNORMAL LOW (ref >60)
GFR calc non Af Amer: 25 mL/min — ABNORMAL LOW (ref >60)
Glucose, Bld: 111 mg/dL — ABNORMAL HIGH (ref 65–99)
Potassium: 4.8 mmol/L (ref 3.5–5.1)
Sodium: 142 mmol/L (ref 135–145)
Total Bilirubin: 0.3 mg/dL (ref 0.3–1.2)
Total Protein: 7.4 g/dL (ref 6.5–8.1)

## 2016-08-01 LAB — APTT: aPTT: 24 seconds (ref 24–36)

## 2016-08-01 LAB — PROTIME-INR
INR: 1.07
Prothrombin Time: 13.9 seconds (ref 11.4–15.2)

## 2016-08-01 LAB — TROPONIN I: Troponin I: <0.03 ng/mL (ref <0.03)

## 2016-08-01 MED ORDER — ONDANSETRON HCL 4 MG/2ML IJ SOLN
4.0000 mg | Freq: Once | INTRAMUSCULAR | Status: AC
  Administered 2016-08-01: 4 mg via INTRAVENOUS

## 2016-08-01 MED ORDER — SODIUM CHLORIDE 0.9 % IV SOLN
8.0000 mg/h | INTRAVENOUS | Status: DC
  Administered 2016-08-01: 8 mg/h via INTRAVENOUS
  Filled 2016-08-01: qty 80

## 2016-08-01 MED ORDER — SODIUM CHLORIDE 0.9 % IV SOLN
80.0000 mg | Freq: Once | INTRAVENOUS | Status: AC
  Administered 2016-08-01: 80 mg via INTRAVENOUS
  Filled 2016-08-01: qty 80

## 2016-08-01 MED ORDER — SODIUM CHLORIDE 0.9 % IV BOLUS (SEPSIS)
500.0000 mL | Freq: Once | INTRAVENOUS | Status: AC
  Administered 2016-08-01: 500 mL via INTRAVENOUS

## 2016-08-01 MED ORDER — PANTOPRAZOLE SODIUM 40 MG IV SOLR
INTRAVENOUS | Status: AC
  Filled 2016-08-01: qty 160

## 2016-08-01 MED ORDER — ONDANSETRON HCL 4 MG/2ML IJ SOLN
INTRAMUSCULAR | Status: AC
  Administered 2016-08-01: 4 mg via INTRAVENOUS
  Filled 2016-08-01: qty 2

## 2016-08-01 MED ORDER — ONDANSETRON HCL 4 MG/2ML IJ SOLN
4.0000 mg | Freq: Once | INTRAMUSCULAR | Status: AC
Start: 2016-08-01 — End: 2016-08-01
  Administered 2016-08-01: 4 mg via INTRAVENOUS
  Filled 2016-08-01: qty 2

## 2016-08-01 MED ORDER — PANTOPRAZOLE SODIUM 40 MG IV SOLR: 40.0000 mg | Freq: Two times a day (BID) | INTRAVENOUS | Status: DC

## 2016-08-01 NOTE — ED Notes (Signed)
Patient requesting a cup of water, asked MD and said yes it was ok.

## 2016-08-01 NOTE — Progress Notes (Signed)
Received report from [facility] ED.

## 2016-08-01 NOTE — ED Notes (Signed)
Helped pt to bathroom and hooked patient back up to monitor.

## 2016-08-01 NOTE — ED Notes (Signed)
Pt assisted to toilet to urinate. Son at bedside.

## 2016-08-01 NOTE — ED Notes (Signed)
Attempted to call report to [facility] x3 with no answer. Will try back before transfer.

## 2016-08-01 NOTE — ED Notes (Signed)
SPOKE  WITH  DOUG  AT  [facility]  PT STILL  WAITING ON  BED  INFORMED  RN  Mickel Baas AND DR   Joni Fears  MD

## 2016-08-01 NOTE — ED Notes (Signed)
Colbert Coyer (granddaughter): [phone]

## 2016-08-01 NOTE — ED Notes (Signed)
Pt and son are aware of pending transfer for GI admission.

## 2016-08-01 NOTE — ED Provider Notes (Addendum)
[facility] Emergency Department Provider Note  ____________________________________________  Time seen: Approximately 10:19 AM  I have reviewed the triage vital signs and the nursing notes.   HISTORY  Chief Complaint Nausea and Abdominal Pain    HPI [name] is a 83 y.o. female , not anticoagulated, with a history of hypertension and GERD presenting with nausea and black stool. The patient reports that for the past several days she has felt nauseated without any vomiting. She has not had diarrhea or constipation and her last bowel movement was this morning, but has noted that her stools have been black. She has been treating her nausea with Pepto-Bismol. She also reports urinary frequency but no burning or dysuria. She is not had lightheadedness, shortness of breath or syncope; no chest pain. No fevers or chills. No abdominal pain.   Past Medical History:  Diagnosis Date  . Arthritis   . GERD (gastroesophageal reflux disease)   . Hypertension   . Neck fracture ([place]) 2015   C2    Patient Active Problem List   Diagnosis Date Noted  . Abdominal pain 03/17/2015    Past Surgical History:  Procedure Laterality Date  . ABDOMINAL HYSTERECTOMY    . CHOLECYSTECTOMY  08/06/14  . EYE SURGERY  2015   cataract    Current Outpatient Rx  . Order #: [id number] Class: Historical Med  . Order #: [id number] Class: Historical Med  . Order #: [id number] Class: Historical Med  . Order #: [id number] Class: Historical Med  . Order #: [id number] Class: Historical Med  . Order #: [id number] Class: Historical Med  . Order #: [id number] Class: Historical Med  . Order #: [id number] Class: Historical Med  . Order #: [id number] Class: Historical Med  . Order #: [id number] Class: Historical Med  . Order #: [id number] Class: Historical Med    Allergies Patient has no known allergies.  Family History  Problem Relation Age of Onset  . Rheumatologic disease Mother     Social  History Social History  Substance Use Topics  . Smoking status: Never Smoker  . Smokeless tobacco: Never Used  . Alcohol use No    Review of Systems Constitutional: No fever/chills.No lightheadedness or syncope. Eyes: No visual changes. ENT: No sore throat. No congestion or rhinorrhea. Cardiovascular: Denies chest pain. Denies palpitations. Respiratory: Denies shortness of breath.  No cough. Gastrointestinal: No abdominal pain.  Positive nausea, no vomiting.  No diarrhea.  No constipation. Positive black stools. Genitourinary: Negative for dysuria. Positive urinary frequency. Musculoskeletal: Negative for back pain. Skin: Negative for rash. Neurological: Negative for headaches. No focal numbness, tingling or weakness.   10-point ROS otherwise negative.  ____________________________________________   PHYSICAL EXAM:  VITAL SIGNS: ED Triage Vitals [08/01/16 0946]  Enc Vitals Group     BP (!) 163/89     Pulse Rate 64     Resp 18     Temp 98.3 F (36.8 C)     Temp Source Oral     SpO2 100 %     Weight 120 lb (54.4 kg)     Height      Head Circumference      Peak Flow      Pain Score 8     Pain Loc      Pain Edu?      Excl. in [place]?     Constitutional: Patient is alert and answering questions appropriate. She is sitting comfortably in the stretcher and is in no acute distress. Eyes: Conjunctivae are normal without pallor.  EOMI. No scleral  icterus. Iris discoloration consistent with arcus senilis in the right greater than left eye. Head: Atraumatic. Nose: No congestion/rhinnorhea. Mouth/Throat: Mucous membranes are moist.  Neck: No stridor.  Supple.  No JVD. No meningismus. Cardiovascular: Normal rate, regular rhythm. No murmurs, rubs or gallops.  Respiratory: Normal respiratory effort.  No accessory muscle use or retractions. Lungs CTAB.  No wheezes, rales or ronchi. Gastrointestinal: Soft, nontender and nondistended.  No guarding or rebound.  No peritoneal signs.   Genitourinary: No evidence of external hemorrhoids are palpable internal hemorrhoids the patient has black stool that is guaiac positive. Musculoskeletal: No LE edema. No ttp in the calves or palpable cords.  Negative Homan's sign. Neurologic:  A&Ox3.  Speech is clear.  Face and smile are symmetric.  EOMI.  Moves all extremities well. Skin:  Skin is warm, dry and intact. No rash noted. Psychiatric: Mood and affect are normal. Speech and behavior are normal.  Normal judgement.  ____________________________________________   LABS (all labs ordered are listed, but only abnormal results are displayed)  Labs Reviewed  CBC - Abnormal; Notable for the following:       Result Value   RBC 2.55 (*)    Hemoglobin 6.5 (*)    HCT 20.3 (*)    MCV 79.6 (*)    MCH 25.6 (*)    RDW 16.8 (*)    All other components within normal limits  COMPREHENSIVE METABOLIC PANEL - Abnormal; Notable for the following:    Chloride 113 (*)    Glucose, Bld 111 (*)    BUN 21 (*)    Creatinine, Ser 1.69 (*)    Calcium 8.7 (*)    ALT 9 (*)    GFR calc non Af Amer 25 (*)    GFR calc Af Amer 29 (*)    All other components within normal limits  PROTIME-INR  APTT  TROPONIN I  URINALYSIS, COMPLETE (UACMP) WITH MICROSCOPIC  TYPE AND SCREEN   ____________________________________________  EKG  ED ECG REPORT I, Eula Listen, the attending physician, personally viewed and interpreted this ECG.   Date: 08/01/2016  EKG Time: 1009  Rate: 61  Rhythm: normal sinus rhythm  Axis: normal  Intervals:none  ST&T Change: No STEMI  ____________________________________________  RADIOLOGY  Dg Chest 2 View  Result Date: 08/01/2016 CLINICAL DATA:  Chest and abdominal pain.  Hypertension. EXAM: CHEST  2 VIEW COMPARISON:  Chest radiograph June 13, 2014; chest CT June 13, 2014 FINDINGS: Areas of interstitial thickening bilaterally are stable. These changes are primarily noted in the mid lower lung zones. There  is no frank edema or consolidation. Heart is mildly enlarged with pulmonary vascularity within normal limits. No adenopathy. There is atherosclerotic calcification in the aorta. Bones are osteoporotic. No demonstrable pneumothorax. IMPRESSION: Stable areas of interstitial thickening.  No edema or consolidation. Cardiomegaly.  No adenopathy. Aortic atherosclerosis. Bones osteoporotic. Electronically Signed   By: Lowella Grip III M.D.   On: 08/01/2016 10:58    ____________________________________________   PROCEDURES  Procedure(s) performed: None  Procedures  Critical Care performed: No ____________________________________________   INITIAL IMPRESSION / ASSESSMENT AND PLAN / ED COURSE  Pertinent labs & imaging results that were available during my care of the patient were reviewed by me and considered in my medical decision making (see chart for details).  83 y.o. female who is not anticoagulated presenting with nausea and black stool. The patient has been taking Pepto-Bismol but her stool is black and guaiac positive sent concerned about GI bleed. At this  time the patient has reassuring vital signs or get blood counts as well as a type and screen. The patient's nausea is nonspecific and she has been having urinary frequency, so we will get a UA to evaluate for UTI. Given her age and risk factors, I'll also get a screening EKG and troponin for atypical presentation of ACS or MI. The patient will likely require admission for further evaluation and treatment.  ----------------------------------------- 12:10 PM on 08/01/2016 -----------------------------------------  The patient does have a significant drop in her hemoglobin from a baseline of 9 or 10 down to 6. At this time, the patient remains hemodynamically stable, and the only blood available to me in the emergency department as emergency release blood. I prefer that she be transfused with typed blood, but it is not an emergency at  this time. Protonix has been ordered as I am concerned about an upper GI bleeding source with her nausea and melena.  There is no GI coverage at [facility] today, so the patient will be transferred to another facility. Of note, the patient also has renal insufficiency today and has been treated with intravenous fluids.  ----------------------------------------- 12:17 PM on 08/01/2016 -----------------------------------------  [facility] transfer [facility] has been contacted.  ----------------------------------------- 1:05 PM on 08/01/2016 -----------------------------------------  The patient continues to remain hemodynamically stable. I have spoken with the [facility] physician, Dr. Paulita Fujita, who will see the patient, and she has been accepted to the hospitalist service by Dr. Karleen Hampshire at [facility]. We are arranging transport once there is a bed assignment.  ____________________________________________  FINAL CLINICAL IMPRESSION(S) / ED DIAGNOSES  Final diagnoses:  Anemia, unspecified type  Gastrointestinal hemorrhage with melena  Nausea  Renal insufficiency         NEW MEDICATIONS STARTED DURING THIS VISIT:  New Prescriptions   No medications on file      Eula Listen, MD 08/01/16 1218    Eula Listen, MD 08/01/16 1305

## 2016-08-01 NOTE — ED Triage Notes (Signed)
Pt to ed with c/o abd pain that started over a week ago.  Pt also reports nausea, decreased appetite, and dark stools.

## 2016-08-02 ENCOUNTER — Encounter (HOSPITAL_COMMUNITY): Payer: Self-pay

## 2016-08-02 DIAGNOSIS — K922 Gastrointestinal hemorrhage, unspecified: Secondary | ICD-10-CM | POA: Diagnosis present

## 2016-08-02 DIAGNOSIS — I10 Essential (primary) hypertension: Secondary | ICD-10-CM | POA: Diagnosis present

## 2016-08-02 DIAGNOSIS — D62 Acute posthemorrhagic anemia: Secondary | ICD-10-CM | POA: Diagnosis present

## 2016-08-02 LAB — RETICULOCYTES
RBC.: 2.48 MIL/uL — ABNORMAL LOW (ref 3.87–5.11)
Retic Count, Absolute: 52.1 10*3/uL (ref 19.0–186.0)
Retic Ct Pct: 2.1 % (ref 0.4–3.1)

## 2016-08-02 LAB — BASIC METABOLIC PANEL
Anion gap: 5 (ref 5–15)
BUN: 14 mg/dL (ref 6–20)
CO2: 24 mmol/L (ref 22–32)
Calcium: 8.5 mg/dL — ABNORMAL LOW (ref 8.9–10.3)
Chloride: 110 mmol/L (ref 101–111)
Creatinine, Ser: 1.39 mg/dL — ABNORMAL HIGH (ref 0.44–1.00)
GFR calc Af Amer: 37 mL/min — ABNORMAL LOW (ref >60)
GFR calc non Af Amer: 32 mL/min — ABNORMAL LOW (ref >60)
Glucose, Bld: 125 mg/dL — ABNORMAL HIGH (ref 65–99)
Potassium: 4.3 mmol/L (ref 3.5–5.1)
Sodium: 139 mmol/L (ref 135–145)

## 2016-08-02 LAB — FERRITIN: Ferritin: 8 ng/mL — ABNORMAL LOW (ref 11–307)

## 2016-08-02 LAB — FOLATE: Folate: 15.7 ng/mL (ref >5.9)

## 2016-08-02 LAB — IRON AND TIBC
Iron: 9 ug/dL — ABNORMAL LOW (ref 28–170)
Saturation Ratios: 3 % — ABNORMAL LOW (ref 10.4–31.8)
TIBC: 350 ug/dL (ref 250–450)
UIBC: 341 ug/dL

## 2016-08-02 LAB — ABO/RH: ABO/RH(D): B POS

## 2016-08-02 LAB — HEMOGLOBIN AND HEMATOCRIT, BLOOD
HCT: 24.2 % — ABNORMAL LOW (ref 36.0–46.0)
Hemoglobin: 8.1 g/dL — ABNORMAL LOW (ref 12.0–15.0)

## 2016-08-02 LAB — VITAMIN B12: Vitamin B-12: 150 pg/mL — ABNORMAL LOW (ref 180–914)

## 2016-08-02 LAB — PREPARE RBC (CROSSMATCH)

## 2016-08-02 MED ORDER — SERTRALINE HCL 25 MG PO TABS
25.0000 mg | ORAL_TABLET | Freq: Every evening | ORAL | Status: DC
  Administered 2016-08-02: 25 mg via ORAL
  Filled 2016-08-02: qty 1

## 2016-08-02 MED ORDER — PANTOPRAZOLE SODIUM 40 MG PO TBEC: 80.0000 mg | DELAYED_RELEASE_TABLET | Freq: Every day | ORAL | Status: DC

## 2016-08-02 MED ORDER — HYDROCODONE-ACETAMINOPHEN 5-325 MG PO TABS: 1.0000 | ORAL_TABLET | Freq: Two times a day (BID) | ORAL | Status: DC | PRN

## 2016-08-02 MED ORDER — SODIUM CHLORIDE 0.9 % IV SOLN
Freq: Once | INTRAVENOUS | Status: AC
Start: 2016-08-02 — End: 2016-08-03
  Administered 2016-08-03: 08:00:00 via INTRAVENOUS

## 2016-08-02 MED ORDER — ALLOPURINOL 300 MG PO TABS
300.0000 mg | ORAL_TABLET | Freq: Every day | ORAL | Status: DC
  Administered 2016-08-02 – 2016-08-03 (×2): 300 mg via ORAL
  Filled 2016-08-02 (×2): qty 1

## 2016-08-02 MED ORDER — HYDRALAZINE HCL 50 MG PO TABS
50.0000 mg | ORAL_TABLET | Freq: Three times a day (TID) | ORAL | Status: DC
  Administered 2016-08-02 – 2016-08-03 (×5): 50 mg via ORAL
  Filled 2016-08-02 (×5): qty 1

## 2016-08-02 MED ORDER — PANTOPRAZOLE SODIUM 40 MG IV SOLR
40.0000 mg | Freq: Two times a day (BID) | INTRAVENOUS | Status: DC
  Administered 2016-08-02 (×3): 40 mg via INTRAVENOUS
  Filled 2016-08-02 (×3): qty 40

## 2016-08-02 MED ORDER — METOPROLOL TARTRATE 25 MG PO TABS
25.0000 mg | ORAL_TABLET | Freq: Two times a day (BID) | ORAL | Status: DC
  Administered 2016-08-02 – 2016-08-03 (×4): 25 mg via ORAL
  Filled 2016-08-02 (×4): qty 1

## 2016-08-02 NOTE — Plan of Care (Signed)
Problem: Safety: Goal: Knowledge of Plan of Care will increase Outcome: Progressing Understands receiving blood due to low hemoglobin

## 2016-08-02 NOTE — Progress Notes (Signed)
PROGRESS NOTE    [name]  YTK:[id number] DOB: Feb 23, 1921 DOA: 08/01/2016 PCP: Volanda Napoleon, MD   No chief complaint on file.    Brief Narrative:  HPI on 08/02/2016 by Dr. Jennette Kettle [name] is a 83 y.o. female with medical history significant of HTN, GERD, gout, arthritis.  Patient presents to the ED at [facility] with c/o several days of nausea, decreased appetite, black stools.  Stools are soft but formed, black in color.  She has been treating her nausea with Pepto-Bismol, she has not been on iron supplementation for some time though was on this in the past for iron deficiency anemia.  No vomiting episodes.  No chest pain, no fevers nor chills.  Has had some abdominal pain intermittently during this time.  Patient has been using daily Mobic for the past 1.5 years per family. Assessment & Plan   Acute blood loss anemia/GI bleed/Melana -Possibly secondary to NSAID use as patient has been taking Mobic for her arthritis pain -Hemoglobin noted to be 6.2 -Patient was transfused 1 unit PRBC -Current hemoglobin 8.1 -Anemia panel showed iron of 9, ferritin 8 -Gastroenterology consultation appreciated, plan for EGD later today -Continue IV Protonix twice daily  Chronic anemia -Patient is supposed to be iron supplementation -Baseline hemoglobin in 2016 was around 9  Essential hypertension -Continue metoprolol, hydralazine  Depression -Continue Zoloft  Gout -Continue allopurinol  DVT Prophylaxis  SCDs  Code Status: Full  Family Communication: Son at bedside  Disposition Plan: Admitted.   Consultants Gastroenterology  Procedures  None  Antibiotics   Anti-infectives    None      Subjective:   [name] seen and examined today. Patient states she is feeling better.  Did have complaints of nausea. Denies chest pain, shortness of breath, dizziness, headache.   Objective:   Vitals:   08/02/16 0407 08/02/16 0430 08/02/16 0727 08/02/16 1037  BP:  (!) 163/61 (!) 143/42 (!) 144/46 (!) 155/60  Pulse: 64 62 68   Resp: 18 18 18    Temp: 98 F (36.7 C) 98.4 F (36.9 C) 99.2 F (37.3 C)   TempSrc: Oral Oral Oral   SpO2: 100% 100% 97%     Intake/Output Summary (Last 24 hours) at 08/02/16 1412 Last data filed at 08/02/16 1145  Gross per 24 hour  Intake              680 ml  Output                0 ml  Net              680 ml   There were no vitals filed for this visit.  Exam  General: Well developed, well nourished, elderly, NAD  HEENT: NCAT,  mucous membranes moist.   Cardiovascular: S1 S2 auscultated, Soft SEM. Regular rate and rhythm.  Respiratory: Clear to auscultation bilaterally with equal chest rise  Abdomen: Soft, nontender, nondistended, + bowel sounds  Extremities: warm dry without cyanosis clubbing or edema  Neuro: AAOx3, nonfocal  Psych: Normal affect and demeanor   Data Reviewed: I have personally reviewed following labs and imaging studies  CBC:  Recent Labs Lab 08/01/16 1014 08/01/16 1545 08/01/16 2044 08/02/16 0927  WBC 7.0 7.8 6.8  --   HGB 6.5* 7.0* 6.2* 8.1*  HCT 20.3* 21.9* 19.8* 24.2*  MCV 79.6* 80.4 79.0*  --   PLT 369 419 344  --    Basic Metabolic Panel:  Recent Labs Lab 08/01/16  1014 08/02/16 0927  NA 142 139  K 4.8 4.3  CL 113* 110  CO2 24 24  GLUCOSE 111* 125*  BUN 21* 14  CREATININE 1.69* 1.39*  CALCIUM 8.7* 8.5*   GFR: CrCl cannot be calculated (Unknown ideal weight.). Liver Function Tests:  Recent Labs Lab 08/01/16 1014  AST 16  ALT 9*  ALKPHOS 64  BILITOT 0.3  PROT 7.4  ALBUMIN 3.6   No results for input(s): LIPASE, AMYLASE in the last 168 hours. No results for input(s): AMMONIA in the last 168 hours. Coagulation Profile:  Recent Labs Lab 08/01/16 1015  INR 1.07   Cardiac Enzymes:  Recent Labs Lab 08/01/16 1015  TROPONINI <0.03   BNP (last 3 results) No results for input(s): PROBNP in the last 8760 hours. HbA1C: No results for input(s):  HGBA1C in the last 72 hours. CBG: No results for input(s): GLUCAP in the last 168 hours. Lipid Profile: No results for input(s): CHOL, HDL, LDLCALC, TRIG, CHOLHDL, LDLDIRECT in the last 72 hours. Thyroid Function Tests: No results for input(s): TSH, T4TOTAL, FREET4, T3FREE, THYROIDAB in the last 72 hours. Anemia Panel:  Recent Labs  08/02/16 0253  VITAMINB12 150*  FOLATE 15.7  FERRITIN 8*  TIBC 350  IRON 9*  RETICCTPCT 2.1   Urine analysis:    Component Value Date/Time   COLORURINE STRAW (A) 08/01/2016 1014   APPEARANCEUR CLEAR (A) 08/01/2016 1014   LABSPEC 1.010 08/01/2016 1014   PHURINE 6.0 08/01/2016 1014   GLUCOSEU NEGATIVE 08/01/2016 1014   HGBUR NEGATIVE 08/01/2016 1014   BILIRUBINUR NEGATIVE 08/01/2016 1014   KETONESUR NEGATIVE 08/01/2016 1014   PROTEINUR NEGATIVE 08/01/2016 1014   NITRITE NEGATIVE 08/01/2016 1014   LEUKOCYTESUR NEGATIVE 08/01/2016 1014   Sepsis Labs: @LABRCNTIP (procalcitonin:4,lacticidven:4)  )No results found for this or any previous visit (from the past 240 hour(s)).    Radiology Studies: Dg Chest 2 View  Result Date: 08/01/2016 CLINICAL DATA:  Chest and abdominal pain.  Hypertension. EXAM: CHEST  2 VIEW COMPARISON:  Chest radiograph June 13, 2014; chest CT June 13, 2014 FINDINGS: Areas of interstitial thickening bilaterally are stable. These changes are primarily noted in the mid lower lung zones. There is no frank edema or consolidation. Heart is mildly enlarged with pulmonary vascularity within normal limits. No adenopathy. There is atherosclerotic calcification in the aorta. Bones are osteoporotic. No demonstrable pneumothorax. IMPRESSION: Stable areas of interstitial thickening.  No edema or consolidation. Cardiomegaly.  No adenopathy. Aortic atherosclerosis. Bones osteoporotic. Electronically Signed   By: Lowella Grip III M.D.   On: 08/01/2016 10:58     Scheduled Meds: . sodium chloride   Intravenous Once  . allopurinol  300  mg Oral Daily  . hydrALAZINE  50 mg Oral TID  . metoprolol tartrate  25 mg Oral BID  . pantoprazole (PROTONIX) IV  40 mg Intravenous Q12H  . sertraline  25 mg Oral QPM   Continuous Infusions:   LOS: 1 day   Time Spent in minutes   30 minutes  [name] D.O. on 08/02/2016 at 2:12 PM  Between 7am to 7pm - Pager - [phone]  After 7pm go to [email or web address] - password [email or web address]  And look for the night coverage person covering for me after hours  [facility] Office  [phone]

## 2016-08-02 NOTE — Consult Note (Addendum)
Referring Provider: Dr. Alcario Drought [facility] Primary Care Physician:  Volanda Napoleon, MD Primary Gastroenterologist:  Althia Forts  Reason for Consultation:  Melena/GI bleed  HPI: [name] is a 83 y.o. female with past medical history of hypertension, gout, arthritis presented to the hospital at [facility] with the nausea, stomach upset and black stools of at least 1 week duration. Upon initial evaluation patient was found to have anemia and patient was transferred to [facility] because of lack of coverage of GI at that hospital.  Patient seen and examined. Appears comfortable at this time. Stated that she started noticing epigastric discomfort along with nausea around a week ago. This was followed by black color stool since last 1 week with 1-2 bowel movements per day. Patient denied any bright red blood per rectum. Denied any dysphagia or odynophagia. She is taking 2 ibuprofen every day along with Mobic for gout and arthritis. No previous endoscopic workup. Not sure about previous colonoscopy. No family history of colon cancer.  Patient is feeling better since admission. Discussed with the nursing staff. No bowel movement since admission. Has received 1 unit of blood transfusion, repeat CBC is pending.  Past Medical History:  Diagnosis Date  . Arthritis   . GERD (gastroesophageal reflux disease)   . Hypertension   . Neck fracture ([place]) 2015   C2    Past Surgical History:  Procedure Laterality Date  . ABDOMINAL HYSTERECTOMY    . CHOLECYSTECTOMY  08/06/14  . EYE SURGERY  2015   cataract    Prior to Admission medications   Medication Sig Start Date End Date Taking? Authorizing Provider  allopurinol (ZYLOPRIM) 300 MG tablet Take 300 mg by mouth daily.    Historical Provider, MD  diclofenac sodium (VOLTAREN) 1 % GEL Apply 2 g topically 2 (two) times daily.    Historical Provider, MD  esomeprazole (NEXIUM) 40 MG capsule Take 40 mg by mouth daily.     Historical  Provider, MD  hydrALAZINE (APRESOLINE) 50 MG tablet Take 50 mg by mouth 2 (two) times daily.     Historical Provider, MD  HYDROcodone-acetaminophen (NORCO/VICODIN) 5-325 MG tablet Take 1 tablet by mouth 2 (two) times daily as needed for moderate pain.    Historical Provider, MD  meloxicam (MOBIC) 7.5 MG tablet Take 7.5 mg by mouth daily. 06/20/16   Historical Provider, MD  metoprolol tartrate (LOPRESSOR) 25 MG tablet Take 25 mg by mouth 2 (two) times daily.    Historical Provider, MD  sertraline (ZOLOFT) 25 MG tablet Take 25 mg by mouth every evening.     Historical Provider, MD    Scheduled Meds: . sodium chloride   Intravenous Once  . allopurinol  300 mg Oral Daily  . hydrALAZINE  50 mg Oral TID  . metoprolol tartrate  25 mg Oral BID  . pantoprazole (PROTONIX) IV  40 mg Intravenous Q12H  . sertraline  25 mg Oral QPM   Continuous Infusions: PRN Meds:.HYDROcodone-acetaminophen  Allergies as of 08/01/2016  . (No Known Allergies)    Family History  Problem Relation Age of Onset  . Rheumatologic disease Mother     Social History   Social History  . Marital status: Single    Spouse name: N/A  . Number of children: N/A  . Years of education: N/A   Occupational History  . Not on file.   Social History Main Topics  . Smoking status: Never Smoker  . Smokeless tobacco: Never Used  . Alcohol use No  .  Drug use: No  . Sexual activity: Not on file   Other Topics Concern  . Not on file   Social History Narrative  . No narrative on file    Review of Systems: All negative except as stated above in HPI. Review of Systems  Constitutional: Positive for malaise/fatigue. Negative for chills and fever.  HENT: Negative for hearing loss and tinnitus.   Eyes: Negative for pain and discharge.  Respiratory: Negative for cough and hemoptysis.   Cardiovascular: Negative for chest pain and palpitations.  Gastrointestinal: Positive for abdominal pain, melena and nausea. Negative for  vomiting.  Genitourinary: Negative for dysuria and hematuria.  Musculoskeletal: Positive for joint pain and myalgias.  Skin: Negative for rash.  Neurological: Positive for weakness. Negative for seizures and loss of consciousness.  Endo/Heme/Allergies: Does not bruise/bleed easily.  Psychiatric/Behavioral: Negative for hallucinations and suicidal ideas.    Physical Exam: Vital signs: Vitals:   08/02/16 0430 08/02/16 0727  BP: (!) 143/42 (!) 144/46  Pulse: 62 68  Resp: 18 18  Temp: 98.4 F (36.9 C) 99.2 F (37.3 C)   Last BM Date: 08/01/16  Physical Exam  Constitutional: She is oriented to person, place, and time and well-developed, well-nourished, and in no distress. No distress.  Appears younger than her age  HENT:  Head: Normocephalic and atraumatic.  Eyes: EOM are normal. Pupils are equal, round, and reactive to light.  Neck: Normal range of motion. Neck supple.  Cardiovascular: Normal rate and regular rhythm.   Murmur heard. Pulmonary/Chest: Effort normal and breath sounds normal. No respiratory distress. She has no wheezes.  Abdominal: Soft. Bowel sounds are normal. She exhibits no distension. There is no tenderness. There is no rebound and no guarding.  Musculoskeletal: Normal range of motion. She exhibits no edema.  Neurological: She is alert and oriented to person, place, and time.  Skin: Skin is dry. She is not diaphoretic. No erythema.  Psychiatric: Affect and judgment normal.    GI:  Lab Results:  Recent Labs  08/01/16 1014 08/01/16 1545 08/01/16 2044  WBC 7.0 7.8 6.8  HGB 6.5* 7.0* 6.2*  HCT 20.3* 21.9* 19.8*  PLT 369 419 344   BMET  Recent Labs  08/01/16 1014  NA 142  K 4.8  CL 113*  CO2 24  GLUCOSE 111*  BUN 21*  CREATININE 1.69*  CALCIUM 8.7*   LFT  Recent Labs  08/01/16 1014  PROT 7.4  ALBUMIN 3.6  AST 16  ALT 9*  ALKPHOS 64  BILITOT 0.3   PT/INR  Recent Labs  08/01/16 1015  LABPROT 13.9  INR 1.07      Studies/Results: Dg Chest 2 View  Result Date: 08/01/2016 CLINICAL DATA:  Chest and abdominal pain.  Hypertension. EXAM: CHEST  2 VIEW COMPARISON:  Chest radiograph June 13, 2014; chest CT June 13, 2014 FINDINGS: Areas of interstitial thickening bilaterally are stable. These changes are primarily noted in the mid lower lung zones. There is no frank edema or consolidation. Heart is mildly enlarged with pulmonary vascularity within normal limits. No adenopathy. There is atherosclerotic calcification in the aorta. Bones are osteoporotic. No demonstrable pneumothorax. IMPRESSION: Stable areas of interstitial thickening.  No edema or consolidation. Cardiomegaly.  No adenopathy. Aortic atherosclerosis. Bones osteoporotic. Electronically Signed   By: Lowella Grip III M.D.   On: 08/01/2016 10:58    Impression/Plan: - Melena along with NSAID use with ibuprofen and Mobic. Need to rule out ulcer disease. No bowel movement since admission. - Acute blood  loss anemia. Hgb down to 6.2. Was around 9 in 03/2015 - Chronic anemia. Patient's hemoglobin was around 9 in November 2016.  Recommendations -------------------------- - Monitor hemoglobin. Transfuse as needed. - Recommend EGD for further evaluation. Risk benefits alternatives discussed with the patient. Verbalized understanding. - Continue IV twice a day PPI for now. Avoid NSAIDs. - NPO PM - GI will follow.    LOS: 1 day   Otis Brace  MD, FACP 08/02/2016, 8:56 AM  Pager [phone] If no answer or after 5 PM call [phone]

## 2016-08-02 NOTE — H&P (Signed)
History and Physical    [name] [name] DOB: January 12, 1921 DOA: 08/01/2016  PCP: Volanda Napoleon, MD  Patient coming from: [facility]  I have personally briefly reviewed patient's old medical records in [place]  Chief Complaint: Melena, anemia  HPI: [name] is a 83 y.o. female with medical history significant of HTN, GERD, gout, arthritis.  Patient presents to the ED at [facility] with c/o several days of nausea, decreased appetite, black stools.  Stools are soft but formed, black in color.  She has been treating her nausea with Pepto-Bismol, she has not been on iron supplementation for some time though was on this in the past for iron deficiency anemia.  No vomiting episodes.  No chest pain, no fevers nor chills.  Has had some abdominal pain intermittently during this time.  Patient has been using daily Mobic for the past 1.5 years per family.  ED Course: HGB 7.0, repeat 6.2 just prior to transfer to [facility].  GI not available for consult at [facility] apparently so patient transferred to  after EDP spoke with Dr. Paulita Fujita.  BP 419 systolic on arrival to [facility].   Review of Systems: As per HPI otherwise 10 point review of systems negative.   Past Medical History:  Diagnosis Date  . Arthritis   . GERD (gastroesophageal reflux disease)   . Hypertension   . Neck fracture ([place]) 2015   C2    Past Surgical History:  Procedure Laterality Date  . ABDOMINAL HYSTERECTOMY    . CHOLECYSTECTOMY  08/06/14  . EYE SURGERY  2015   cataract     reports that she has never smoked. She has never used smokeless tobacco. She reports that she does not drink alcohol or use drugs.  No Known Allergies  Family History  Problem Relation Age of Onset  . Rheumatologic disease Mother      Prior to Admission medications   Medication Sig Start Date End Date Taking? Authorizing Provider  allopurinol (ZYLOPRIM) 300 MG tablet Take 300 mg by mouth daily.    Historical Provider, MD  diclofenac  sodium (VOLTAREN) 1 % GEL Apply 2 g topically 2 (two) times daily.    Historical Provider, MD  esomeprazole (NEXIUM) 40 MG capsule Take 40 mg by mouth daily.     Historical Provider, MD  hydrALAZINE (APRESOLINE) 50 MG tablet Take 50 mg by mouth 2 (two) times daily.     Historical Provider, MD  HYDROcodone-acetaminophen (NORCO/VICODIN) 5-325 MG tablet Take 1 tablet by mouth 2 (two) times daily as needed for moderate pain.    Historical Provider, MD  meloxicam (MOBIC) 7.5 MG tablet Take 7.5 mg by mouth daily. 06/20/16   Historical Provider, MD  metoprolol tartrate (LOPRESSOR) 25 MG tablet Take 25 mg by mouth 2 (two) times daily.    Historical Provider, MD  sertraline (ZOLOFT) 25 MG tablet Take 25 mg by mouth every evening.     Historical Provider, MD    Physical Exam: Vitals:   08/01/16 2344  BP: (!) 190/76  Pulse: 76  Resp: 18  Temp: 98.4 F (36.9 C)  TempSrc: Oral  SpO2: 96%    Constitutional: NAD, calm, comfortable Eyes: PERRL, lids and conjunctivae normal ENMT: Mucous membranes are moist. Posterior pharynx clear of any exudate or lesions.Normal dentition.  Neck: normal, supple, no masses, no thyromegaly Respiratory: clear to auscultation bilaterally, no wheezing, no crackles. Normal respiratory effort. No accessory muscle use.  Cardiovascular: Regular rate and rhythm, no murmurs / rubs /  gallops. No extremity edema. 2+ pedal pulses. No carotid bruits.  Abdomen: no tenderness, no masses palpated. No hepatosplenomegaly. Bowel sounds positive.  Musculoskeletal: no clubbing / cyanosis. No joint deformity upper and lower extremities. Good ROM, no contractures. Normal muscle tone.  Skin: no rashes, lesions, ulcers. No induration Neurologic: CN 2-12 grossly intact. Sensation intact, DTR normal. Strength 5/5 in all 4.  Psychiatric: Normal judgment and insight. Alert and oriented x 3. Normal mood.    Labs on Admission: I have personally reviewed following labs and imaging  studies  CBC:  Recent Labs Lab 08/01/16 1014 08/01/16 1545 08/01/16 2044  WBC 7.0 7.8 6.8  HGB 6.5* 7.0* 6.2*  HCT 20.3* 21.9* 19.8*  MCV 79.6* 80.4 79.0*  PLT 369 419 053   Basic Metabolic Panel:  Recent Labs Lab 08/01/16 1014  NA 142  K 4.8  CL 113*  CO2 24  GLUCOSE 111*  BUN 21*  CREATININE 1.69*  CALCIUM 8.7*   GFR: CrCl cannot be calculated (Unknown ideal weight.). Liver Function Tests:  Recent Labs Lab 08/01/16 1014  AST 16  ALT 9*  ALKPHOS 64  BILITOT 0.3  PROT 7.4  ALBUMIN 3.6   No results for input(s): LIPASE, AMYLASE in the last 168 hours. No results for input(s): AMMONIA in the last 168 hours. Coagulation Profile:  Recent Labs Lab 08/01/16 1015  INR 1.07   Cardiac Enzymes:  Recent Labs Lab 08/01/16 1015  TROPONINI <0.03   BNP (last 3 results) No results for input(s): PROBNP in the last 8760 hours. HbA1C: No results for input(s): HGBA1C in the last 72 hours. CBG: No results for input(s): GLUCAP in the last 168 hours. Lipid Profile: No results for input(s): CHOL, HDL, LDLCALC, TRIG, CHOLHDL, LDLDIRECT in the last 72 hours. Thyroid Function Tests: No results for input(s): TSH, T4TOTAL, FREET4, T3FREE, THYROIDAB in the last 72 hours. Anemia Panel: No results for input(s): VITAMINB12, FOLATE, FERRITIN, TIBC, IRON, RETICCTPCT in the last 72 hours. Urine analysis:    Component Value Date/Time   COLORURINE STRAW (A) 08/01/2016 1014   APPEARANCEUR CLEAR (A) 08/01/2016 1014   LABSPEC 1.010 08/01/2016 1014   PHURINE 6.0 08/01/2016 1014   GLUCOSEU NEGATIVE 08/01/2016 1014   HGBUR NEGATIVE 08/01/2016 1014   BILIRUBINUR NEGATIVE 08/01/2016 1014   KETONESUR NEGATIVE 08/01/2016 1014   PROTEINUR NEGATIVE 08/01/2016 1014   NITRITE NEGATIVE 08/01/2016 1014   LEUKOCYTESUR NEGATIVE 08/01/2016 1014    Radiological Exams on Admission: Dg Chest 2 View  Result Date: 08/01/2016 CLINICAL DATA:  Chest and abdominal pain.  Hypertension. EXAM:  CHEST  2 VIEW COMPARISON:  Chest radiograph June 13, 2014; chest CT June 13, 2014 FINDINGS: Areas of interstitial thickening bilaterally are stable. These changes are primarily noted in the mid lower lung zones. There is no frank edema or consolidation. Heart is mildly enlarged with pulmonary vascularity within normal limits. No adenopathy. There is atherosclerotic calcification in the aorta. Bones are osteoporotic. No demonstrable pneumothorax. IMPRESSION: Stable areas of interstitial thickening.  No edema or consolidation. Cardiomegaly.  No adenopathy. Aortic atherosclerosis. Bones osteoporotic. Electronically Signed   By: Lowella Grip III M.D.   On: 08/01/2016 10:58    EKG: Independently reviewed.  Assessment/Plan Principal Problem:   Acute blood loss anemia Active Problems:   HTN (hypertension)   UGIB (upper gastrointestinal bleed)    1. Blood loss anemia, likely subacute over past week or so -  1. Transfuse 1 unit PRBC 2. H/H repeat post transfusion 3. Anemia pnl to look  for underlying iron deficiency anemia which she has had in the past 2. UGIB - history is suspicious for an NSAID induced ulcer or other cause of UGIB 1. Hemoccult ordered and pending with next BM, dark stools could also be in part due to bismuth (Pepto-bismol) use. 2. Doubt massive hyperacute GIB at this time from the appearance of patient, vital signs, and HPI 3. Call GI in AM 4. IV Protonix ordered 5. Stopping Mobic 6. Clear liquid diet 3. HTN - continue home meds, getting dose of metoprolol now for SBP 190  DVT prophylaxis: SCDs Code Status: Full Family Communication: Family at bedside Disposition Plan: Home after admission Consults called: None, EDP at [facility] spoke with Dr. Paulita Fujita with GI before sending to [facility] Admission status: Admit to inpatient   [place] [place] [facility] Pager [phone]  If 7AM-7PM, please contact day team taking care of patient [email or web address] Password  [facility]  08/02/2016, 12:35 AM

## 2016-08-03 ENCOUNTER — Inpatient Hospital Stay (HOSPITAL_COMMUNITY): Payer: Medicare Other | Admitting: Certified Registered Nurse Anesthetist

## 2016-08-03 ENCOUNTER — Encounter (HOSPITAL_COMMUNITY)
Admission: AD | Disposition: A | Discharge status: Home / Self Care | Payer: Self-pay | Source: Other Acute Inpatient Hospital | Attending: Internal Medicine

## 2016-08-03 ENCOUNTER — Encounter (HOSPITAL_COMMUNITY): Payer: Self-pay | Admitting: Certified Registered Nurse Anesthetist

## 2016-08-03 DIAGNOSIS — K922 Gastrointestinal hemorrhage, unspecified: Secondary | ICD-10-CM

## 2016-08-03 DIAGNOSIS — M1990 Unspecified osteoarthritis, unspecified site: Secondary | ICD-10-CM

## 2016-08-03 HISTORY — PX: ESOPHAGOGASTRODUODENOSCOPY (EGD) WITH PROPOFOL: SHX5813

## 2016-08-03 LAB — CBC
HCT: 25.4 % — ABNORMAL LOW (ref 36.0–46.0)
Hemoglobin: 8.2 g/dL — ABNORMAL LOW (ref 12.0–15.0)
MCH: 25.2 pg — ABNORMAL LOW (ref 26.0–34.0)
MCHC: 32.3 g/dL (ref 30.0–36.0)
MCV: 78.2 fL (ref 78.0–100.0)
Platelets: 372 10*3/uL (ref 150–400)
RBC: 3.25 MIL/uL — ABNORMAL LOW (ref 3.87–5.11)
RDW: 16 % — ABNORMAL HIGH (ref 11.5–15.5)
WBC: 8.4 10*3/uL (ref 4.0–10.5)

## 2016-08-03 LAB — BASIC METABOLIC PANEL
Anion gap: 6 (ref 5–15)
BUN: 10 mg/dL (ref 6–20)
CO2: 23 mmol/L (ref 22–32)
Calcium: 8.7 mg/dL — ABNORMAL LOW (ref 8.9–10.3)
Chloride: 111 mmol/L (ref 101–111)
Creatinine, Ser: 1.48 mg/dL — ABNORMAL HIGH (ref 0.44–1.00)
GFR calc Af Amer: 34 mL/min — ABNORMAL LOW (ref >60)
GFR calc non Af Amer: 29 mL/min — ABNORMAL LOW (ref >60)
Glucose, Bld: 116 mg/dL — ABNORMAL HIGH (ref 65–99)
Potassium: 4.2 mmol/L (ref 3.5–5.1)
Sodium: 140 mmol/L (ref 135–145)

## 2016-08-03 LAB — TYPE AND SCREEN
ABO/RH(D): B POS
ABO/RH(D): B POS
Antibody Screen: NEGATIVE
Antibody Screen: NEGATIVE
Unit division: 0

## 2016-08-03 LAB — BPAM RBC
Blood Product Expiration Date: 201804132359
ISSUE DATE / TIME: 201803210359
Unit Type and Rh: 7300

## 2016-08-03 SURGERY — ESOPHAGOGASTRODUODENOSCOPY (EGD) WITH PROPOFOL
Anesthesia: Monitor Anesthesia Care

## 2016-08-03 MED ORDER — ONDANSETRON HCL 4 MG/2ML IJ SOLN
INTRAMUSCULAR | Status: DC | PRN
  Administered 2016-08-03: 4 mg via INTRAVENOUS

## 2016-08-03 MED ORDER — LIDOCAINE 2% (20 MG/ML) 5 ML SYRINGE
INTRAMUSCULAR | Status: AC
  Filled 2016-08-03: qty 5

## 2016-08-03 MED ORDER — LIDOCAINE 2% (20 MG/ML) 5 ML SYRINGE
INTRAMUSCULAR | Status: DC | PRN
  Administered 2016-08-03: 70 mg via INTRAVENOUS

## 2016-08-03 MED ORDER — PROPOFOL 10 MG/ML IV BOLUS
INTRAVENOUS | Status: AC
  Filled 2016-08-03: qty 40

## 2016-08-03 MED ORDER — PROPOFOL 500 MG/50ML IV EMUL
INTRAVENOUS | Status: DC | PRN
  Administered 2016-08-03: 150 ug/kg/min via INTRAVENOUS

## 2016-08-03 MED ORDER — PROPOFOL 10 MG/ML IV BOLUS
INTRAVENOUS | Status: DC | PRN
  Administered 2016-08-03: 10 mg via INTRAVENOUS
  Administered 2016-08-03: 20 mg via INTRAVENOUS

## 2016-08-03 MED ORDER — SODIUM CHLORIDE 0.9 % IV SOLN
510.0000 mg | Freq: Once | INTRAVENOUS | Status: AC
  Administered 2016-08-03: 510 mg via INTRAVENOUS
  Filled 2016-08-03: qty 17

## 2016-08-03 MED ORDER — PANTOPRAZOLE SODIUM 40 MG PO TBEC
40.0000 mg | DELAYED_RELEASE_TABLET | Freq: Every day | ORAL | Status: DC
  Administered 2016-08-03: 40 mg via ORAL
  Filled 2016-08-03: qty 1

## 2016-08-03 MED ORDER — ONDANSETRON HCL 4 MG/2ML IJ SOLN
INTRAMUSCULAR | Status: AC
  Filled 2016-08-03: qty 2

## 2016-08-03 SURGICAL SUPPLY — 14 items

## 2016-08-03 NOTE — Anesthesia Postprocedure Evaluation (Addendum)
Anesthesia Post Note  Patient: [name]  Procedure(s) Performed: Procedure(s) (LRB): ESOPHAGOGASTRODUODENOSCOPY (EGD) WITH PROPOFOL (N/A)  Patient location during evaluation: Endoscopy Anesthesia Type: MAC Level of consciousness: awake and alert Pain management: pain level controlled Vital Signs Assessment: post-procedure vital signs reviewed and stable Respiratory status: spontaneous breathing, nonlabored ventilation, respiratory function stable and patient connected to nasal cannula oxygen Cardiovascular status: stable and blood pressure returned to baseline Anesthetic complications: no       Last Vitals:  Vitals:   08/03/16 1005 08/03/16 1149  BP: (!) 171/59 (!) 130/53  Pulse: 61 67  Resp: 18   Temp: 37 C     Last Pain:  Vitals:   08/03/16 1005  TempSrc: Oral  PainSc:                  [name],JAMES TERRILL

## 2016-08-03 NOTE — Progress Notes (Signed)
Date: August 03, 2016 Discharge orders review for case management needs.  None found Velva Harman, BSN, [place], [place]: [phone]

## 2016-08-03 NOTE — Anesthesia Preprocedure Evaluation (Addendum)
Anesthesia Evaluation  Patient identified by MRN, date of birth, ID band Patient awake    Reviewed: Allergy & Precautions, NPO status   History of Anesthesia Complications Negative for: history of anesthetic complications  Airway Mallampati: II  TM Distance: <3 FB Neck ROM: Limited    Dental  (+) Missing, Poor Dentition   Pulmonary    breath sounds clear to auscultation       Cardiovascular hypertension,  Rhythm:Regular Rate:Normal + Systolic murmurs    Neuro/Psych Hx of C2 fracture    GI/Hepatic GERD  ,  Endo/Other    Renal/GU      Musculoskeletal  (+) Arthritis ,   Abdominal   Peds  Hematology   Anesthesia Other Findings   Reproductive/Obstetrics                            Anesthesia Physical Anesthesia Plan  ASA: III  Anesthesia Plan: MAC   Post-op Pain Management:    Induction: Intravenous  Airway Management Planned: Natural Airway  Additional Equipment:   Intra-op Plan:   Post-operative Plan:   Informed Consent: I have reviewed the patients History and Physical, chart, labs and discussed the procedure including the risks, benefits and alternatives for the proposed anesthesia with the patient or authorized representative who has indicated his/her understanding and acceptance.     Plan Discussed with: CRNA  Anesthesia Plan Comments:         Anesthesia Quick Evaluation

## 2016-08-03 NOTE — Transfer of Care (Signed)
Immediate Anesthesia Transfer of Care Note  Patient: [name]  Procedure(s) Performed: Procedure(s): ESOPHAGOGASTRODUODENOSCOPY (EGD) WITH PROPOFOL (N/A)  Patient Location: PACU  Anesthesia Type:MAC  Level of Consciousness:  sedated, patient cooperative and responds to stimulation  Airway & Oxygen Therapy:Patient Spontanous Breathing and Patient connected to face mask oxgen  Post-op Assessment:  Report given to PACU RN and Post -op Vital signs reviewed and stable  Post vital signs:  Reviewed and stable  Last Vitals:  Vitals:   08/03/16 0605 08/03/16 0809  BP: (!) 150/60 (!) 197/76  Pulse: 67 63  Resp: 18 20  Temp: 37.2 C 67.6 C    Complications: No apparent anesthesia complications

## 2016-08-03 NOTE — Discharge Instructions (Signed)
Anemia, Nonspecific Anemia is a condition in which the concentration of red blood cells or hemoglobin in the blood is below normal. Hemoglobin is a substance in red blood cells that carries oxygen to the tissues of the body. Anemia results in not enough oxygen reaching these tissues. What are the causes? Common causes of anemia include:  Excessive bleeding. Bleeding may be internal or external. This includes excessive bleeding from periods (in women) or from the intestine.  Poor nutrition.  Chronic kidney, thyroid, and liver disease.  Bone marrow disorders that decrease red blood cell production.  Cancer and treatments for cancer.  HIV, AIDS, and their treatments.  Spleen problems that increase red blood cell destruction.  Blood disorders.  Excess destruction of red blood cells due to infection, medicines, and autoimmune disorders. What are the signs or symptoms?  Minor weakness.  Dizziness.  Headache.  Palpitations.  Shortness of breath, especially with exercise.  Paleness.  Cold sensitivity.  Indigestion.  Nausea.  Difficulty sleeping.  Difficulty concentrating. Symptoms may occur suddenly or they may develop slowly. How is this diagnosed? Additional blood tests are often needed. These help your health care provider determine the best treatment. Your health care provider will check your stool for blood and look for other causes of blood loss. How is this treated? Treatment varies depending on the cause of the anemia. Treatment can include:  Supplements of iron, vitamin B12, or folic acid.  Hormone medicines.  A blood transfusion. This may be needed if blood loss is severe.  Hospitalization. This may be needed if there is significant continual blood loss.  Dietary changes.  Spleen removal. Follow these instructions at home: Keep all follow-up appointments. It often takes many weeks to correct anemia, and having your health care provider check on your  condition and your response to treatment is very important. Get help right away if:  You develop extreme weakness, shortness of breath, or chest pain.  You become dizzy or have trouble concentrating.  You develop heavy vaginal bleeding.  You develop a rash.  You have bloody or black, tarry stools.  You faint.  You vomit up blood.  You vomit repeatedly.  You have abdominal pain.  You have a fever or persistent symptoms for more than 2-3 days.  You have a fever and your symptoms suddenly get worse.  You are dehydrated. This information is not intended to replace advice given to you by your health care provider. Make sure you discuss any questions you have with your health care provider. Document Released: 06/08/2004 Document Revised: 10/13/2015 Document Reviewed: 10/25/2012 Elsevier Interactive Patient Education  2017 Elsevier Inc.  

## 2016-08-03 NOTE — Discharge Summary (Signed)
Physician Discharge Summary  [name]:[id number] DOB: 10/13/1920 DOA: 08/01/2016  PCP: Volanda Napoleon, MD  Admit date: 08/01/2016 Discharge date: 08/03/2016  Time spent: 45 minutes  Recommendations for Outpatient Follow-up:  Patient will be discharged to home.  Patient will need to follow up with primary care provider within one week of discharge, repeat CBC.  Patient should continue medications as prescribed.  Patient should follow a heart healthy diet.   Discharge Diagnoses:  Acute blood loss anemia/GI bleed/Melana Chronic anemia Essential hypertension Depression Gout  Discharge Condition: Stable  Diet recommendation: heart healthy  Filed Weights   08/03/16 0809  Weight: 54.4 kg (120 lb)    History of present illness:  on 08/02/2016 by Dr. Marijo File [name] a 83 y.o.femalewith medical history significant of HTN, GERD, gout, arthritis. Patient presents to the ED at [facility] with c/o several days of nausea, decreased appetite, black stools. Stools are soft but formed, black in color. She has been treating her nausea with Pepto-Bismol, she has not been on iron supplementation for some time though was on this in the past for iron deficiency anemia. No vomiting episodes. No chest pain, no fevers nor chills. Has had some abdominal pain intermittently during this time.  Patient has been using daily Mobic for the past 1.5 years per family.  Hospital Course:  Acute blood loss anemia/GI bleed/Melana -Possibly secondary to NSAID use as patient has been taking Mobic for her arthritis pain -Hemoglobin noted to be 6.2 -Patient was transfused 1 unit PRBC -Current hemoglobin 8.2 today -Anemia panel showed iron of 9, ferritin 8. Given dose of feraheme.  Patient is to continue iron supplementation -Gastroenterology consultation appreciated -S/p EGD: normal. GI recommended PPI prophylaxis (lifelong)- Omeprazole 20mg  daily if patient is going to be taking ASA  or NSAIDS -Reperat CBC in one week.  If hemoglobin drops, may need further outpatient GI eval: colonoscopy vs video capsule endoscopy may be needed  Chronic anemia -Patient is supposed to be iron supplementation -Baseline hemoglobin in 2016 was around 9  Essential hypertension -Continue metoprolol, hydralazine  Depression -Continue Zoloft  Gout -Continue allopurinol  Procedures: EGD  Consultations: Gastroenterology  Discharge Exam: Vitals:   08/03/16 1005 08/03/16 1149  BP: (!) 171/59 (!) 130/53  Pulse: 61 67  Resp: 18   Temp: 98.6 F (37 C)    Patient has no complaints today.  Very hungry after procedure today.  States bowel movements are still dark. Denies chest pain, shortness of breath, dizziness, headache.   Exam  General: Well developed, well nourished, elderly, NAD  HEENT: NCAT,  mucous membranes moist.   Cardiovascular: S1 S2 auscultated, Soft SEM. Regular rate and rhythm.  Respiratory: Clear to auscultation bilaterally with equal chest rise  Abdomen: Soft, nontender, nondistended, + bowel sounds  Extremities: warm dry without cyanosis clubbing or edema  Neuro: AAOx3, nonfocal  Psych: Normal affect and demeanor, pleasant  Discharge Instructions Discharge Instructions    Discharge instructions    Complete by:  As directed    Patient will be discharged to home.  Patient will need to follow up with primary care provider within one week of discharge, repeat CBC.  Patient should continue medications as prescribed.  Patient should follow a heart healthy diet.   If hemoglobin drops, follow up with Gastroenterology, Dr. Alessandra Bevels.  If you continue to take Mobic, make sure to take nexium.     Current Discharge Medication List    CONTINUE these medications which have NOT CHANGED  Details  allopurinol (ZYLOPRIM) 300 MG tablet Take 300 mg by mouth daily.    alum & mag hydroxide-simeth (MAALOX/MYLANTA) 200-200-20 MG/5ML suspension Take 30 mLs by  mouth once.    diclofenac sodium (VOLTAREN) 1 % GEL Apply 2 g topically 2 (two) times daily.    esomeprazole (NEXIUM) 40 MG capsule Take 40 mg by mouth daily.     hydrALAZINE (APRESOLINE) 50 MG tablet Take 50 mg by mouth 2 (two) times daily.     HYDROcodone-acetaminophen (NORCO/VICODIN) 5-325 MG tablet Take 1 tablet by mouth 2 (two) times daily.     meloxicam (MOBIC) 7.5 MG tablet Take 7.5 mg by mouth daily.    metoprolol tartrate (LOPRESSOR) 25 MG tablet Take 25 mg by mouth 2 (two) times daily.    sertraline (ZOLOFT) 25 MG tablet Take 25 mg by mouth daily with breakfast.     tiZANidine (ZANAFLEX) 2 MG tablet Take 4 mg by mouth at bedtime.       No Known Allergies Follow-up Information    TEJAN-SIE, Clotilde Dieter, MD. Schedule an appointment as soon as possible for a visit in 1 week(s).   Specialty:  Internal Medicine Why:  Hospital follow up, repeat CBC Contact information: [place] [address] [phone]        Otis Brace, MD. Schedule an appointment as soon as possible for a visit.   Specialty:  Gastroenterology Why:  If needed and there is a drop in your hemoglobin.  Contact information: [place] [address] [phone]            The results of significant diagnostics from this hospitalization (including imaging, microbiology, ancillary and laboratory) are listed below for reference.    Significant Diagnostic Studies: Dg Chest 2 View  Result Date: 08/01/2016 CLINICAL DATA:  Chest and abdominal pain.  Hypertension. EXAM: CHEST  2 VIEW COMPARISON:  Chest radiograph June 13, 2014; chest CT June 13, 2014 FINDINGS: Areas of interstitial thickening bilaterally are stable. These changes are primarily noted in the mid lower lung zones. There is no frank edema or consolidation. Heart is mildly enlarged with pulmonary vascularity within normal limits. No adenopathy. There is atherosclerotic calcification in the  aorta. Bones are osteoporotic. No demonstrable pneumothorax. IMPRESSION: Stable areas of interstitial thickening.  No edema or consolidation. Cardiomegaly.  No adenopathy. Aortic atherosclerosis. Bones osteoporotic. Electronically Signed   By: Lowella Grip III M.D.   On: 08/01/2016 10:58    Microbiology: No results found for this or any previous visit (from the past 240 hour(s)).   Labs: Basic Metabolic Panel:  Recent Labs Lab 08/01/16 1014 08/02/16 0927 08/03/16 1047  NA 142 139 140  K 4.8 4.3 4.2  CL 113* 110 111  CO2 24 24 23   GLUCOSE 111* 125* 116*  BUN 21* 14 10  CREATININE 1.69* 1.39* 1.48*  CALCIUM 8.7* 8.5* 8.7*   Liver Function Tests:  Recent Labs Lab 08/01/16 1014  AST 16  ALT 9*  ALKPHOS 64  BILITOT 0.3  PROT 7.4  ALBUMIN 3.6   No results for input(s): LIPASE, AMYLASE in the last 168 hours. No results for input(s): AMMONIA in the last 168 hours. CBC:  Recent Labs Lab 08/01/16 1014 08/01/16 1545 08/01/16 2044 08/02/16 0927 08/03/16 1047  WBC 7.0 7.8 6.8  --  8.4  HGB 6.5* 7.0* 6.2* 8.1* 8.2*  HCT 20.3* 21.9* 19.8* 24.2* 25.4*  MCV 79.6* 80.4 79.0*  --  78.2  PLT 369 419 344  --  372   Cardiac Enzymes:  Recent Labs Lab 08/01/16 1015  TROPONINI <0.03   BNP: BNP (last 3 results) No results for input(s): BNP in the last 8760 hours.  ProBNP (last 3 results) No results for input(s): PROBNP in the last 8760 hours.  CBG: No results for input(s): GLUCAP in the last 168 hours.     SignedCristal Ford  [facility] 08/03/2016, 1:16 PM

## 2016-08-03 NOTE — Progress Notes (Signed)
Dr. Orene Desanctis notified that patient's blood pressure was 191/62. He said that she could go back upstairs and then get her medications.

## 2016-08-03 NOTE — Progress Notes (Signed)
Stable overnight.  Hgb now 8.1, BUN down.  VSS, pt in NAD, abd NT.  Stable for egd.  Cleotis Nipper, M.D. Pager [phone] If no answer or after 5 PM call [phone]

## 2016-08-03 NOTE — Progress Notes (Signed)
EGD well-tolerated and surprisingly was NORMAL.  Suspect this was an upper tract source (w/ rapid healing leading to neg egd) but cannot totally excluded a proximal lower GI source.  Nonetheless, unless evid of ongoing bld loss, would not pursue colonoscopy in this elderly pt.  Have advanced diet.  OK for dischg any time from our standpoint, although would probably check another CBC this afternoon/tomorrow to confirm stability following equilibration.  Will sign off, call us if needed.  Recomm's for dischg:  1. Lifelong PPI prophylaxis (eg omeprazole 20 qd) if pt is going to be on ASA and/or NSAID's 2. Would have pt f/u w/ PCP in approx 1 wk to check hemoccult status and hgb--if she is persistently heme positive or hgb dwindles down, further eval (? Colon   ? Video capsule endoscopy of small bowel) might be needed.  Cleotis Nipper, M.D. Pager [phone] If no answer or after 5 PM call [phone]

## 2016-08-03 NOTE — Op Note (Signed)
[facility] Patient Name: [name] Procedure Date: 08/03/2016 MRN: [id number] Attending MD: Ronald Lobo , MD Date of Birth: 08/19/1920 CSN: [id number] Age: 83 Admit Type: Inpatient Procedure:                Upper GI endoscopy Indications:              Acute post hemorrhagic anemia, Melena in an elderly                            patient on meloxicam, ibuprofen and aspirin prior                            to admission Providers:                Ronald Lobo, MD, Elmer Ramp. Tilden Dome, RN, Corliss Parish, Technician Referring MD:              Medicines:                Monitored Anesthesia Care Complications:            No immediate complications. Estimated Blood Loss:     Estimated blood loss: none. Procedure:                Pre-Anesthesia Assessment:                           - Prior to the procedure, a History and Physical                            was performed, and patient medications and                            allergies were reviewed. The patient's tolerance of                            previous anesthesia was also reviewed. The risks                            and benefits of the procedure and the sedation                            options and risks were discussed with the patient.                            All questions were answered, and informed consent                            was obtained. Prior Anticoagulants: The patient has                            taken aspirin, last dose was 2 days prior to  procedure. ASA Grade Assessment: III - A patient                            with severe systemic disease. After reviewing the                            risks and benefits, the patient was deemed in                            satisfactory condition to undergo the procedure.                           After obtaining informed consent, the endoscope was                            passed under direct  vision. Throughout the                            procedure, the patient's blood pressure, pulse, and                            oxygen saturations were monitored continuously. The                            [id number] ([id number]) pediatric endoscope was                            introduced through the mouth, and advanced to the                            second part of duodenum. The upper GI endoscopy was                            accomplished without difficulty. The patient                            tolerated the procedure well. Scope In: Scope Out: Findings:      The examined esophagus was normal.      A mild Schatzki ring (acquired) was found at the gastroesophageal       junction.      A 1 cm hiatal hernia was present.      The entire examined stomach was normal.      There is no endoscopic evidence of bleeding, erythema or inflammatory       changes suggestive of gastritis, inflammation, ulceration or blood or       fluid collections in the stomach.      The examined duodenum was normal. Impression:               - No source of recent GI blood loss seen.                           - Normal esophagus.                           -  Mild Schatzki ring.                           - 1 cm hiatal hernia.                           - Normal stomach. Careful inspection revealed no                            abnormalities despite recent exposure to                            ulcerogenic medications.                           - Normal examined duodenum.                           - No specimens collected. Moderate Sedation:      This patient was sedated with monitored anesthesia care, not moderate       sedation.      This patient was sedated with monitored anesthesia care, not moderate       sedation. Recommendation:           - Continue present medications. Would continue qd                            PPI indefinitely if patient will be on ASA or                            NSAID's going  forward.                           - Advance diet as tolerated.                           - Consider further GI evaluation IF patient shows                            evidence of ongoing blood loss after discharge                            (persistent heme positivity, drop in hgb) Procedure Code(s):        --- Professional ---                           [phone], Esophagogastroduodenoscopy, flexible,                            transoral; diagnostic, including collection of                            specimen(s) by brushing or washing, when performed                            (separate procedure) Diagnosis Code(s):        --- Professional ---  D62, Acute posthemorrhagic anemia                           K92.1, Melena (includes Hematochezia) CPT copyright 2016 American Medical Association. All rights reserved. The codes documented in this report are preliminary and upon coder review may  be revised to meet current compliance requirements. Ronald Lobo, MD 08/03/2016 9:35:32 AM This report has been signed electronically. Number of Addenda: 0

## 2016-08-06 ENCOUNTER — Encounter (HOSPITAL_COMMUNITY): Payer: Self-pay | Admitting: Gastroenterology

## 2016-10-13 NOTE — Addendum Note (Signed)
Addendum  created 10/13/16 1250 by Rica Koyanagi, MD   Sign clinical note

## 2018-08-29 ENCOUNTER — Ambulatory Visit (INDEPENDENT_AMBULATORY_CARE_PROVIDER_SITE_OTHER): Payer: Medicare Other | Admitting: Podiatry

## 2018-08-29 ENCOUNTER — Encounter: Payer: Self-pay | Admitting: Podiatry

## 2018-08-29 ENCOUNTER — Other Ambulatory Visit: Payer: Self-pay

## 2018-08-29 VITALS — Temp 98.1°F

## 2018-08-29 DIAGNOSIS — B351 Tinea unguium: Secondary | ICD-10-CM | POA: Diagnosis not present

## 2018-08-29 DIAGNOSIS — M79676 Pain in unspecified toe(s): Secondary | ICD-10-CM | POA: Diagnosis not present

## 2018-08-29 NOTE — Progress Notes (Signed)
Subjective:  Patient ID: [name], female    DOB: [birth year],  MRN: [id number] HPI Chief Complaint  Patient presents with  . Nail Problem    Requesting nail care - nails are thick and caregiver is unable to trim  . New Patient (Initial Visit)    85 y.o. female presents with the above complaint.   ROS: Denies fever chills nausea vomiting muscle aches pains calf pain back pain chest pain shortness of breath.  Past Medical History:  Diagnosis Date  . Arthritis   . GERD (gastroesophageal reflux disease)   . Hypertension   . Neck fracture ([place]) 2015   C2   Past Surgical History:  Procedure Laterality Date  . ABDOMINAL HYSTERECTOMY    . CHOLECYSTECTOMY  08/06/14  . ESOPHAGOGASTRODUODENOSCOPY (EGD) WITH PROPOFOL N/A 08/03/2016   Procedure: ESOPHAGOGASTRODUODENOSCOPY (EGD) WITH PROPOFOL;  Surgeon: Ronald Lobo, MD;  Location: [facility];  Service: Gastroenterology;  Laterality: N/A;  . EYE SURGERY  2015   cataract    Current Outpatient Medications:  .  docusate sodium (COLACE) 100 MG capsule, Docusate Sodium 100 MG Oral Capsule QTY: 0 capsule Days: 0 Refills: 0  Written: 06/01/15 Patient Instructions:, Disp: , Rfl:  .  ferrous sulfate 325 (65 FE) MG tablet, Ferrous Sulfate 325 (65 Fe) MG Oral Tablet QTY: 0 tablet Days: 0 Refills: 0  Written: 03/16/15 Patient Instructions:, Disp: , Rfl:  .  folic acid (FOLVITE) 1 MG tablet, Folic Acid 1 MG Oral Tablet QTY: 90 each Days: 90 Refills: 0  Written: 04/19/18 Patient Instructions: TAKE ONE TABLET BY MOUTH EVERY DAY, Disp: , Rfl:  .  Iron Polysacch Cmplx-B12-FA (POLY-IRON 150 FORTE) 150-0.025-1 MG CAPS, Poly-Iron 150 Forte 150-25-1 MG-MCG-MG Oral Capsule QTY: 30 each Days: 30 Refills: 0  Written: 07/30/18 Patient Instructions: 1 every morning, Disp: , Rfl:  .  pantoprazole (PROTONIX) 40 MG tablet, Pantoprazole Sodium 40 MG Oral Tablet Delayed Release QTY: 30 each Days: 30 Refills: 2  Written: 07/05/18 Patient Instructions: TAKE 1 TABLET  BY MOUTH DAILY, Disp: , Rfl:  .  allopurinol (ZYLOPRIM) 300 MG tablet, Take 300 mg by mouth daily., Disp: , Rfl:  .  diclofenac sodium (VOLTAREN) 1 % GEL, Apply 2 g topically 2 (two) times daily., Disp: , Rfl:  .  hydrALAZINE (APRESOLINE) 50 MG tablet, Take 50 mg by mouth 2 (two) times daily. , Disp: , Rfl:  .  HYDROcodone-acetaminophen (NORCO/VICODIN) 5-325 MG tablet, Take 1 tablet by mouth 2 (two) times daily. , Disp: , Rfl:  .  meloxicam (MOBIC) 7.5 MG tablet, Take 7.5 mg by mouth daily., Disp: , Rfl:  .  metoprolol tartrate (LOPRESSOR) 25 MG tablet, Take 25 mg by mouth 2 (two) times daily., Disp: , Rfl:  .  sertraline (ZOLOFT) 25 MG tablet, Take 25 mg by mouth daily with breakfast. , Disp: , Rfl:  .  tiZANidine (ZANAFLEX) 2 MG tablet, Take 4 mg by mouth at bedtime., Disp: , Rfl:   No Known Allergies Review of Systems Objective:   Vitals:   08/29/18 0853  Temp: 98.1 F (36.7 C)    General: Well developed, nourished, in no acute distress, alert and oriented x3   Dermatological: Skin is warm, dry and supple bilateral. Nails x 10 are well maintained; remaining integument appears unremarkable at this time. There are no open sores, no preulcerative lesions, no rash or signs of infection present.  Vascular: Dorsalis Pedis artery and Posterior Tibial artery pedal pulses are 2/4 bilateral with immedate capillary fill time.  Pedal hair growth present. No varicosities and no lower extremity edema present bilateral.   Neruologic: Grossly intact via light touch bilateral. Vibratory intact via tuning fork bilateral. Protective threshold with Semmes Wienstein monofilament intact to all pedal sites bilateral. Patellar and Achilles deep tendon reflexes 2+ bilateral. No Babinski or clonus noted bilateral.   Musculoskeletal: No gross boney pedal deformities bilateral. No pain, crepitus, or limitation noted with foot and ankle range of motion bilateral. Muscular strength 5/5 in all groups tested  bilateral.  Gait: Unassisted, Nonantalgic.    Radiographs:  Pain in limb secondary to onychomycosis.  Assessment & Plan:   Assessment: Pain in limb secondary to onychomycosis.  Plan: Debridement of toenails 1 through 5 bilateral.     Chandan Fly T. Pace, [place]

## 2018-11-28 ENCOUNTER — Ambulatory Visit: Payer: Medicare Other | Admitting: Podiatry

## 2019-04-21 ENCOUNTER — Other Ambulatory Visit: Payer: Self-pay

## 2019-04-21 ENCOUNTER — Inpatient Hospital Stay
Admission: EM | Admit: 2019-04-21 | Discharge: 2019-04-26 | DRG: 812 | Disposition: A | Discharge status: Home Health | Payer: Medicare Other | Attending: Internal Medicine | Admitting: Internal Medicine

## 2019-04-21 DIAGNOSIS — Z9049 Acquired absence of other specified parts of digestive tract: Secondary | ICD-10-CM | POA: Diagnosis not present

## 2019-04-21 DIAGNOSIS — R4189 Other symptoms and signs involving cognitive functions and awareness: Secondary | ICD-10-CM

## 2019-04-21 DIAGNOSIS — Z79899 Other long term (current) drug therapy: Secondary | ICD-10-CM

## 2019-04-21 DIAGNOSIS — I959 Hypotension, unspecified: Secondary | ICD-10-CM

## 2019-04-21 DIAGNOSIS — D649 Anemia, unspecified: Secondary | ICD-10-CM | POA: Diagnosis present

## 2019-04-21 DIAGNOSIS — K219 Gastro-esophageal reflux disease without esophagitis: Secondary | ICD-10-CM | POA: Diagnosis present

## 2019-04-21 DIAGNOSIS — Z20828 Contact with and (suspected) exposure to other viral communicable diseases: Secondary | ICD-10-CM | POA: Diagnosis present

## 2019-04-21 DIAGNOSIS — B888 Other specified infestations: Secondary | ICD-10-CM | POA: Diagnosis present

## 2019-04-21 DIAGNOSIS — I129 Hypertensive chronic kidney disease with stage 1 through stage 4 chronic kidney disease, or unspecified chronic kidney disease: Secondary | ICD-10-CM | POA: Diagnosis present

## 2019-04-21 DIAGNOSIS — G9340 Encephalopathy, unspecified: Secondary | ICD-10-CM | POA: Diagnosis present

## 2019-04-21 DIAGNOSIS — K922 Gastrointestinal hemorrhage, unspecified: Secondary | ICD-10-CM | POA: Diagnosis not present

## 2019-04-21 DIAGNOSIS — D631 Anemia in chronic kidney disease: Secondary | ICD-10-CM | POA: Diagnosis present

## 2019-04-21 DIAGNOSIS — M1A9XX0 Chronic gout, unspecified, without tophus (tophi): Secondary | ICD-10-CM | POA: Diagnosis not present

## 2019-04-21 DIAGNOSIS — R55 Syncope and collapse: Secondary | ICD-10-CM

## 2019-04-21 DIAGNOSIS — F329 Major depressive disorder, single episode, unspecified: Secondary | ICD-10-CM | POA: Diagnosis present

## 2019-04-21 DIAGNOSIS — N1832 Chronic kidney disease, stage 3b: Secondary | ICD-10-CM | POA: Diagnosis present

## 2019-04-21 DIAGNOSIS — I9589 Other hypotension: Secondary | ICD-10-CM | POA: Diagnosis present

## 2019-04-21 DIAGNOSIS — H9190 Unspecified hearing loss, unspecified ear: Secondary | ICD-10-CM | POA: Diagnosis present

## 2019-04-21 DIAGNOSIS — F32A Depression, unspecified: Secondary | ICD-10-CM | POA: Diagnosis present

## 2019-04-21 DIAGNOSIS — T39395A Adverse effect of other nonsteroidal anti-inflammatory drugs [NSAID], initial encounter: Secondary | ICD-10-CM | POA: Diagnosis present

## 2019-04-21 DIAGNOSIS — Z90710 Acquired absence of both cervix and uterus: Secondary | ICD-10-CM | POA: Diagnosis not present

## 2019-04-21 DIAGNOSIS — M109 Gout, unspecified: Secondary | ICD-10-CM | POA: Diagnosis present

## 2019-04-21 DIAGNOSIS — D62 Acute posthemorrhagic anemia: Secondary | ICD-10-CM | POA: Diagnosis present

## 2019-04-21 DIAGNOSIS — K222 Esophageal obstruction: Secondary | ICD-10-CM | POA: Diagnosis not present

## 2019-04-21 DIAGNOSIS — N179 Acute kidney failure, unspecified: Secondary | ICD-10-CM | POA: Diagnosis present

## 2019-04-21 DIAGNOSIS — Z791 Long term (current) use of non-steroidal anti-inflammatories (NSAID): Secondary | ICD-10-CM

## 2019-04-21 DIAGNOSIS — D509 Iron deficiency anemia, unspecified: Secondary | ICD-10-CM | POA: Diagnosis present

## 2019-04-21 DIAGNOSIS — W57XXXA Bitten or stung by nonvenomous insect and other nonvenomous arthropods, initial encounter: Secondary | ICD-10-CM | POA: Diagnosis present

## 2019-04-21 LAB — CBC
HCT: 20.7 % — ABNORMAL LOW (ref 36.0–46.0)
Hemoglobin: 6.8 g/dL — ABNORMAL LOW (ref 12.0–15.0)
MCH: 26.3 pg (ref 26.0–34.0)
MCHC: 32.9 g/dL (ref 30.0–36.0)
MCV: 79.9 fL — ABNORMAL LOW (ref 80.0–100.0)
Platelets: 292 10*3/uL (ref 150–400)
RBC: 2.59 MIL/uL — ABNORMAL LOW (ref 3.87–5.11)
RDW: 16.1 % — ABNORMAL HIGH (ref 11.5–15.5)
WBC: 10.7 10*3/uL — ABNORMAL HIGH (ref 4.0–10.5)
nRBC: 0.3 % — ABNORMAL HIGH (ref 0.0–0.2)

## 2019-04-21 LAB — URINALYSIS, COMPLETE (UACMP) WITH MICROSCOPIC
Bacteria, UA: NONE SEEN
Bilirubin Urine: NEGATIVE
Glucose, UA: NEGATIVE mg/dL
Hgb urine dipstick: NEGATIVE
Ketones, ur: 5 mg/dL — AB
Leukocytes,Ua: NEGATIVE
Nitrite: NEGATIVE
Protein, ur: NEGATIVE mg/dL
Specific Gravity, Urine: 1.019 (ref 1.005–1.030)
pH: 5 (ref 5.0–8.0)

## 2019-04-21 LAB — BASIC METABOLIC PANEL
Anion gap: 8 (ref 5–15)
BUN: 34 mg/dL — ABNORMAL HIGH (ref 8–23)
CO2: 21 mmol/L — ABNORMAL LOW (ref 22–32)
Calcium: 7.8 mg/dL — ABNORMAL LOW (ref 8.9–10.3)
Chloride: 110 mmol/L (ref 98–111)
Creatinine, Ser: 2.12 mg/dL — ABNORMAL HIGH (ref 0.44–1.00)
GFR calc Af Amer: 22 mL/min — ABNORMAL LOW (ref >60)
GFR calc non Af Amer: 19 mL/min — ABNORMAL LOW (ref >60)
Glucose, Bld: 104 mg/dL — ABNORMAL HIGH (ref 70–99)
Potassium: 3.7 mmol/L (ref 3.5–5.1)
Sodium: 139 mmol/L (ref 135–145)

## 2019-04-21 LAB — TROPONIN I (HIGH SENSITIVITY): Troponin I (High Sensitivity): 74 ng/L — ABNORMAL HIGH (ref <18)

## 2019-04-21 MED ORDER — SODIUM CHLORIDE 0.9 % IV SOLN
10.0000 mL/h | Freq: Once | INTRAVENOUS | Status: AC
  Administered 2019-04-22: 10 mL/h via INTRAVENOUS

## 2019-04-21 MED ORDER — SODIUM CHLORIDE 0.9% FLUSH
3.0000 mL | Freq: Once | INTRAVENOUS | Status: AC
  Administered 2019-04-21: 22:00:00 3 mL via INTRAVENOUS

## 2019-04-21 NOTE — H&P (Addendum)
History and Physical    [name] [id number] DOB: Aug 07, 1920 DOA: 04/21/2019  PCP: Jodi Marble, MD  Patient coming from: Home  I have personally briefly reviewed patient's old medical records in [place]  Chief Complaint: unresponsiveness  HPI: [name] is a 85 y.o. female with medical history significant of hypertension, hx of chronic anemia requiring blood transfusion, GERD, CKD, and depression who presents with concerns of unresponsiveness.  History is obtained from her granddaughter, [name] by phone given that patient does not quite recall the event and is only oriented to self.  She reports that patient took her nighttime medicines tonight and was found to be difficult to arouse in her chair even though she usually goes straight to bed after her medication.  Granddaughter notes that she appeared to be having some deep respirations as if she was in a deep sleep but family was unable to wake her.  They then called EMS and was told by the dispatcher to perform CPR.  The patient's daughter did 2 chest compressions but stopped because patient appeared to be breathing.  On EMS arrival, patient had regained consciousness and was found to be hypotensive with systolic in the [id number] with improvement after 500 cc of fluid bolus. Patient has had decreased p.o. intake for the past 2 days.  About 2 days ago she saw her primary in the office for lower leg swelling and was told that this could be due to aging.  Granddaughter thinks that this caused her to be somewhat depressed and to eat less. Patient denies any lightheadedness or dizziness.  She denies any chest pain or shortness of breath.  Granddaughter does not recall her having any dark stools.  No nausea vomiting or diarrhea. Pt notes abdomen pain here but family has not heard her complaint of this.  EKG has significant baseline artifact and incomplete left bundle branch block.  Troponin of 74.  Denies any tobacco, alcohol or  illicit drug use.   ED Course: Patient had temperature of 97 and was normotensive on room air.  CBC showed leukocytosis of 10.7, hemoglobin of 6.8.  BMP showed glucose of 104, creatinine of 2.12, BUN of 34.  Review of Systems:  Negative except for what is in HPI. Could not do full ROS with pt given noted cognitive impairment.  Past Medical History:  Diagnosis Date   Arthritis    GERD (gastroesophageal reflux disease)    Hypertension    Neck fracture ([place]) 2015   C2    Past Surgical History:  Procedure Laterality Date   ABDOMINAL HYSTERECTOMY     CHOLECYSTECTOMY  08/06/14   ESOPHAGOGASTRODUODENOSCOPY (EGD) WITH PROPOFOL N/A 08/03/2016   Procedure: ESOPHAGOGASTRODUODENOSCOPY (EGD) WITH PROPOFOL;  Surgeon: Ronald Lobo, MD;  Location: [facility];  Service: Gastroenterology;  Laterality: N/A;   EYE SURGERY  2015   cataract     reports that she has never smoked. She has never used smokeless tobacco. She reports that she does not drink alcohol or use drugs.  No Known Allergies  Family History  Problem Relation Age of Onset   Rheumatologic disease Mother      Prior to Admission medications   Medication Sig Start Date End Date Taking? Authorizing Provider  allopurinol (ZYLOPRIM) 300 MG tablet Take 300 mg by mouth daily.   Yes [provider]  COLCRYS 0.6 MG tablet Take 1 tablet by mouth daily as needed. 04/14/19  Yes [provider]  diclofenac Sodium (VOLTAREN) 1 % GEL  Apply 2 g topically 2 (two) times daily. 04/09/19  Yes [provider]  docusate sodium (COLACE) 100 MG capsule Docusate Sodium 100 MG Oral Capsule QTY: 0 capsule Days: 0 Refills: 0  Written: 06/01/15 Patient Instructions: 06/01/15  Yes [provider]  folic acid (FOLVITE) 1 MG tablet Folic Acid 1 MG Oral Tablet QTY: 90 each Days: 90 Refills: 0  Written: 04/19/18 Patient Instructions: TAKE ONE TABLET BY MOUTH EVERY DAY 04/19/18  Yes [provider]  hydrALAZINE  (APRESOLINE) 50 MG tablet Take 50 mg by mouth 2 (two) times daily.    Yes [provider]  HYDROcodone-acetaminophen (NORCO/VICODIN) 5-325 MG tablet Take 1 tablet by mouth 2 (two) times daily.    Yes [provider]  meloxicam (MOBIC) 7.5 MG tablet Take 7.5 mg by mouth daily. 06/20/16  Yes [provider]  metoprolol tartrate (LOPRESSOR) 25 MG tablet Take 25 mg by mouth 2 (two) times daily.   Yes [provider]  pantoprazole (PROTONIX) 40 MG tablet Pantoprazole Sodium 40 MG Oral Tablet Delayed Release QTY: 30 each Days: 30 Refills: 2  Written: 07/05/18 Patient Instructions: TAKE 1 TABLET BY MOUTH DAILY 07/05/18  Yes [provider]  POLY-IRON 150 FORTE 150-25-1 MG-MCG-MG CAPS Take 1 capsule by mouth every morning. 03/28/19  Yes [provider]  sertraline (ZOLOFT) 25 MG tablet Take 25 mg by mouth daily with breakfast.    Yes [provider]  tiZANidine (ZANAFLEX) 2 MG tablet Take 4 mg by mouth at bedtime.   Yes [provider]    Physical Exam: Vitals:   04/21/19 2130 04/21/19 2130 04/21/19 2200 04/21/19 2230  BP: (!) 114/48  (!) 119/57 (!) 118/51  Pulse: 61  (!) 58 (!) 56  Resp: 15  11 19   Temp:  (!) 97 F (36.1 C)    TempSrc:  Rectal    SpO2: 100%  100% 100%  Weight:      Height:        Constitutional: NAD, calm, comfortable, thin elderly female laying in bed Vitals:   04/21/19 2130 04/21/19 2130 04/21/19 2200 04/21/19 2230  BP: (!) 114/48  (!) 119/57 (!) 118/51  Pulse: 61  (!) 58 (!) 56  Resp: 15  11 19   Temp:  (!) 97 F (36.1 C)    TempSrc:  Rectal    SpO2: 100%  100% 100%  Weight:      Height:       Eyes:  lids and conjunctivae normal ENMT: Mucous membranes are moist.  Neck: normal, supple, no masses Respiratory: clear to auscultation bilaterally, no wheezing, no crackles. Normal respiratory effort.  Cardiovascular: Regular rate and rhythm, no murmurs / rubs / gallops. No extremity edema.   Abdomen:  no tenderness,  Bowel sounds positive.  Musculoskeletal: no clubbing / cyanosis. No joint deformity upper and lower extremities.  Skin: no rashes, lesions, ulcers. No induration. Bedbug noted on blanket.  Neurologic: CN 2-12 grossly intact. Sensation intact. Unable to access strength as pt did not seem to fully understand when asked to lift legs for strength testing. Psychiatric: alert and oriented to self. Able to answer some questions.    Labs on Admission: I have personally reviewed following labs and imaging studies  CBC: Recent Labs  Lab 04/21/19 2102  WBC 10.7*  HGB 6.8*  HCT 20.7*  MCV 79.9*  PLT 123456   Basic Metabolic Panel: Recent Labs  Lab 04/21/19 2102  NA 139  K 3.7  CL 110  CO2  21*  GLUCOSE 104*  BUN 34*  CREATININE 2.12*  CALCIUM 7.8*   GFR: Estimated Creatinine Clearance: 13.3 mL/min (A) (by C-G formula based on SCr of 2.12 mg/dL (H)). Liver Function Tests: No results for input(s): AST, ALT, ALKPHOS, BILITOT, PROT, ALBUMIN in the last 168 hours. No results for input(s): LIPASE, AMYLASE in the last 168 hours. No results for input(s): AMMONIA in the last 168 hours. Coagulation Profile: No results for input(s): INR, PROTIME in the last 168 hours. Cardiac Enzymes: No results for input(s): CKTOTAL, CKMB, CKMBINDEX, TROPONINI in the last 168 hours. BNP (last 3 results) No results for input(s): PROBNP in the last 8760 hours. HbA1C: No results for input(s): HGBA1C in the last 72 hours. CBG: No results for input(s): GLUCAP in the last 168 hours. Lipid Profile: No results for input(s): CHOL, HDL, LDLCALC, TRIG, CHOLHDL, LDLDIRECT in the last 72 hours. Thyroid Function Tests: No results for input(s): TSH, T4TOTAL, FREET4, T3FREE, THYROIDAB in the last 72 hours. Anemia Panel: No results for input(s): VITAMINB12, FOLATE, FERRITIN, TIBC, IRON, RETICCTPCT in the last 72 hours. Urine analysis:    Component Value Date/Time   COLORURINE STRAW (A) 08/01/2016 1014     APPEARANCEUR CLEAR (A) 08/01/2016 1014   LABSPEC 1.010 08/01/2016 1014   PHURINE 6.0 08/01/2016 1014   GLUCOSEU NEGATIVE 08/01/2016 1014   HGBUR NEGATIVE 08/01/2016 1014   BILIRUBINUR NEGATIVE 08/01/2016 1014   KETONESUR NEGATIVE 08/01/2016 1014   PROTEINUR NEGATIVE 08/01/2016 1014   NITRITE NEGATIVE 08/01/2016 1014   LEUKOCYTESUR NEGATIVE 08/01/2016 1014    Radiological Exams on Admission: No results found.  EKG: Independently reviewed.   Assessment/Plan Altered mental status likely secondary to hypotension due to acute on chronic anemia -continue gentle fluid - transfuse 1 unit pRBC,Check CBC q8hr.  - Has hx of anemia requiring transfusion back in 2018.  Had EGD at that time which did not show any GI bleeding and showed only mild schatzki ring. - obtain iron panel, vitamin [id number] and folic, FOBT - consider GI consult pending blood work - hold hydralazine and metoprolol   AKI - suspect from dehydration vs acute blood loss, also noted to have daily Mobic and voltaren gel on med list - hold mobic and voltaren gel - avoid nephrotoxic agent  Bedbug - bedbug noted on exam but no noted significant rash seen. family was informed to clean linens and mattress at home - no complaints of puritis   Hx of mild schatzki ring - Speech eval  hx of Gout - continue allopurinol   Depression -continue zoloft  Cognitive impairment -only oriented to self     DVT prophylaxis:.SCD Code Status:Full  Family Communication: Plan discussed with patient at bedside and with granddaughter [name] over the phone  [phone]  disposition Plan: Home with at least 2 midnight stays  Consults called:  Admission status: inpatient   Satrina Magallanes T Ervin Rothbauer DO [facility]   If 7PM-7AM, please contact night-coverage [email or web address] Password [facility]  04/21/2019, 11:27 PM

## 2019-04-21 NOTE — ED Triage Notes (Signed)
Pt to ED via [facility] from home. EMS was called due to family thinking pt went into cardiac arrest and appeared to not be breathing. Family moved pt onto floor and started 2-3 compressions before pt started to arouse.   Per EMS pt only complaint is feeling tired. Pt bp low, systolic in [id number] so [id number] fluid bolus given. Systolic BP increased to 123XX123. Temp 97.8.    Upon arrival pt denies remembering what happened. Pt in NAD and VSS. Pt denies CP or SOB.

## 2019-04-21 NOTE — ED Provider Notes (Signed)
[facility] Emergency Department Provider Note ____________________________________________   First MD Initiated Contact with Patient 04/21/19 2103     (approximate)  I have reviewed the triage vital signs and the nursing notes.   HISTORY  Chief Complaint Loss of Consciousness    HPI [name] is a 85 y.o. female with PMH as noted below who presents after an apparent syncopal event.  Per EMS, the family noted that the patient was unresponsive and appeared as if she may not have been breathing.  The family members put the patient on the floor and attempted to start chest compressions, but the patient began to arouse.  They noted that the patient was somewhat hypotensive in the field, with a blood pressure in the 80s.  The patient does not remember what happened.  She reports some swelling in her legs and states she feels a bit tired.  She also reports feeling nauseated.  She denies any other acute complaints.  Past Medical History:  Diagnosis Date  . Arthritis   . GERD (gastroesophageal reflux disease)   . Hypertension   . Neck fracture ([place]) 2015   C2    Patient Active Problem List   Diagnosis Date Noted  . HTN (hypertension) 08/02/2016  . UGIB (upper gastrointestinal bleed) 08/02/2016  . Acute blood loss anemia 08/02/2016  . Climacteric arthritis, lower leg 03/22/2016  . Depression 03/22/2016  . Esophageal reflux 03/22/2016  . Osteoarthritis of knee 09/07/2015  . Closed fracture of second cervical vertebra ([place]) 09/01/2015  . Abdominal pain 03/17/2015  . Chronic kidney disease 04/28/2014    Past Surgical History:  Procedure Laterality Date  . ABDOMINAL HYSTERECTOMY    . CHOLECYSTECTOMY  08/06/14  . ESOPHAGOGASTRODUODENOSCOPY (EGD) WITH PROPOFOL N/A 08/03/2016   Procedure: ESOPHAGOGASTRODUODENOSCOPY (EGD) WITH PROPOFOL;  Surgeon: Ronald Lobo, MD;  Location: [facility];  Service: Gastroenterology;  Laterality: N/A;  . EYE SURGERY  2015    cataract    Prior to Admission medications   Medication Sig Start Date End Date Taking? Authorizing Provider  allopurinol (ZYLOPRIM) 300 MG tablet Take 300 mg by mouth daily.   Yes [provider]  COLCRYS 0.6 MG tablet Take 1 tablet by mouth daily as needed. 04/14/19  Yes [provider]  diclofenac Sodium (VOLTAREN) 1 % GEL Apply 2 g topically 2 (two) times daily. 04/09/19  Yes [provider]  docusate sodium (COLACE) 100 MG capsule Docusate Sodium 100 MG Oral Capsule QTY: 0 capsule Days: 0 Refills: 0  Written: 06/01/15 Patient Instructions: 06/01/15  Yes [provider]  folic acid (FOLVITE) 1 MG tablet Folic Acid 1 MG Oral Tablet QTY: 90 each Days: 90 Refills: 0  Written: 04/19/18 Patient Instructions: TAKE ONE TABLET BY MOUTH EVERY DAY 04/19/18  Yes [provider]  hydrALAZINE (APRESOLINE) 50 MG tablet Take 50 mg by mouth 2 (two) times daily.    Yes [provider]  HYDROcodone-acetaminophen (NORCO/VICODIN) 5-325 MG tablet Take 1 tablet by mouth 2 (two) times daily.    Yes [provider]  meloxicam (MOBIC) 7.5 MG tablet Take 7.5 mg by mouth daily. 06/20/16  Yes [provider]  metoprolol tartrate (LOPRESSOR) 25 MG tablet Take 25 mg by mouth 2 (two) times daily.   Yes [provider]  pantoprazole (PROTONIX) 40 MG tablet Pantoprazole Sodium 40 MG Oral Tablet Delayed Release QTY: 30 each Days: 30 Refills: 2  Written: 07/05/18 Patient Instructions: TAKE 1 TABLET BY MOUTH DAILY 07/05/18  Yes [provider]  POLY-IRON 150 FORTE 150-25-1 MG-MCG-MG CAPS Take 1 capsule by mouth every morning. 03/28/19  Yes [provider]  sertraline (ZOLOFT) 25 MG tablet Take 25 mg by mouth daily with breakfast.    Yes [provider]  tiZANidine (ZANAFLEX) 2 MG tablet Take 4 mg by mouth at bedtime.   Yes [provider]    Allergies Patient has no known allergies.  Family History  Problem  Relation Age of Onset  . Rheumatologic disease Mother     Social History Social History   Tobacco Use  . Smoking status: Never Smoker  . Smokeless tobacco: Never Used  Substance Use Topics  . Alcohol use: No    Alcohol/week: 0.0 standard drinks  . Drug use: No    Review of Systems  Constitutional: No fever.  Positive for weakness. Eyes: No redness. ENT: No sore throat. Cardiovascular: Denies chest pain. Respiratory: Denies shortness of breath. Gastrointestinal: Positive for nausea.  No vomiting or diarrhea. Genitourinary: Negative for dysuria.  Musculoskeletal: Negative for back pain. Skin: Negative for rash. Neurological: Negative for headache.   ____________________________________________   PHYSICAL EXAM:  VITAL SIGNS: ED Triage Vitals  Enc Vitals Group     BP 04/21/19 2100 (!) 129/54     Pulse Rate 04/21/19 2055 70     Resp 04/21/19 2055 14     Temp 04/21/19 2055 (!) 96 F (35.6 C)     Temp Source 04/21/19 2055 Axillary     SpO2 04/21/19 2055 100 %     Weight 04/21/19 2054 120 lb (54.4 kg)     Height 04/21/19 2059 5\' 9"  (1.753 m)     Head Circumference --      Peak Flow --      Pain Score 04/21/19 2055 0     Pain Loc --      Pain Edu? --      Excl. in [place]? --     Constitutional: Alert and oriented x2. Well appearing for age and in no acute distress. Eyes: Conjunctivae are normal.  EOMI.  PERRLA. Head: Atraumatic. Nose: No congestion/rhinnorhea. Mouth/Throat: Mucous membranes are dry.   Neck: Normal range of motion.  Cardiovascular: Normal rate, regular rhythm.  Good peripheral circulation. Respiratory: Normal respiratory effort.  No retractions.  Gastrointestinal: Soft and nontender. No distention.  Genitourinary: No flank tenderness. Musculoskeletal: Trace bilateral lower extremity edema near the ankles.  Extremities warm and well perfused.  Neurologic:  Normal speech and language.  Motor intact in all extremities.  Normal coordination.  No gross  focal neurologic deficits are appreciated.  Skin:  Skin is warm and dry. No rash noted. Psychiatric: Calm and cooperative.  ____________________________________________   LABS (all labs ordered are listed, but only abnormal results are displayed)  Labs Reviewed  BASIC METABOLIC PANEL - Abnormal; Notable for the following components:      Result Value   CO2 21 (*)    Glucose, Bld 104 (*)    BUN 34 (*)    Creatinine, Ser 2.12 (*)    Calcium 7.8 (*)    GFR calc non Af Amer 19 (*)    GFR calc Af Amer 22 (*)    All other components within normal limits  CBC - Abnormal; Notable for the following components:   WBC 10.7 (*)    RBC 2.59 (*)    Hemoglobin 6.8 (*)    HCT 20.7 (*)    MCV 79.9 (*)    RDW 16.1 (*)  nRBC 0.3 (*)    All other components within normal limits  TROPONIN I (HIGH SENSITIVITY) - Abnormal; Notable for the following components:   Troponin I (High Sensitivity) 74 (*)    All other components within normal limits  SARS CORONAVIRUS 2 (TAT 6-24 HRS)  URINALYSIS, COMPLETE (UACMP) WITH MICROSCOPIC  CBG MONITORING, ED  TYPE AND SCREEN  PREPARE RBC (CROSSMATCH)  TROPONIN I (HIGH SENSITIVITY)   ____________________________________________  EKG  ED ECG REPORT I, Arta Silence, the attending physician, personally viewed and interpreted this ECG.  Date: 04/21/2019 EKG Time: 2056 Rate: 67 Rhythm: normal sinus rhythm QRS Axis: normal Intervals: Incomplete LBBB, prolonged QTc ST/T Wave abnormalities: normal Narrative Interpretation: no evidence of acute ischemia  ____________________________________________  RADIOLOGY    ____________________________________________   PROCEDURES  Procedure(s) performed: No  Procedures  Critical Care performed: No ____________________________________________   INITIAL IMPRESSION / ASSESSMENT AND PLAN / ED COURSE  Pertinent labs & imaging results that were available during my care of the patient were reviewed  by me and considered in my medical decision making (see chart for details).  85 year old female with PMH as noted above presents with an episode in which she was found unresponsive although immediately aroused to when the family attempted CPR.  From the description by EMS, it is unclear that the patient was ever pulseless or apneic.  She was found to be somewhat hypotensive by EMS but this has resolved after a small fluid bolus.  The patient herself states she feels slightly weak and reports some chronic leg swelling.  She denies other acute symptoms.  I reviewed the past medical records in [place].  The patient has a history of GI bleed and anemia.  Her most recent admission was 2 years ago.  On exam today, the patient is relatively well-appearing for her age.  Her vital signs including the BP are now normal.  Neurologic exam is nonfocal.  The patient is alert and oriented x2.  She states she does not know the year, but does know her birthdate and does not appear confused at all.  She has trace bilateral lower extremity edema and somewhat dry mucous membranes.  Overall differential is extremely broad, but the episode appears to be more consistent with syncope than seizure or an actual cardiac arrest.  EKG shows no acute abnormalities although the patient does have a prolonged QTc.  We will obtain lab work-up, urinalysis, and reassess.  ----------------------------------------- 11:19 PM on 04/21/2019 -----------------------------------------  The patient is chronically anemic, but lab work-up today reveals worsening hemoglobin currently 6.8.  The patient's creatinine is also worse than her baseline.  Given these findings, I will admit the patient to the hospital.  I ordered a unit of blood and performed a DRE.  The patient has brown stool that is guaiac negative.  Her blood pressure remained stable.  I discussed the results of the work-up with the patient and her granddaughter who is now here.  The  granddaughter states that the patient was difficult to arouse, but did not have any apnea or cardiac arrest.  This is consistent with a syncopal event.  It was not long after she took some of her evening medications, so it may also have been a medication reaction.  I discussed the case with Dr. Flossie Buffy from the hospital service for admission.  ____________________________________________   FINAL CLINICAL IMPRESSION(S) / ED DIAGNOSES  Final diagnoses:  Anemia, unspecified type  Syncope, unspecified syncope type      NEW MEDICATIONS STARTED  DURING THIS VISIT:  New Prescriptions   No medications on file     Note:  This document was prepared using Dragon voice recognition software and may include unintentional dictation errors.    Arta Silence, MD 04/21/19 2320

## 2019-04-22 DIAGNOSIS — R4189 Other symptoms and signs involving cognitive functions and awareness: Secondary | ICD-10-CM | POA: Diagnosis present

## 2019-04-22 DIAGNOSIS — I959 Hypotension, unspecified: Secondary | ICD-10-CM | POA: Diagnosis present

## 2019-04-22 DIAGNOSIS — R55 Syncope and collapse: Secondary | ICD-10-CM

## 2019-04-22 DIAGNOSIS — M109 Gout, unspecified: Secondary | ICD-10-CM | POA: Diagnosis not present

## 2019-04-22 DIAGNOSIS — W57XXXA Bitten or stung by nonvenomous insect and other nonvenomous arthropods, initial encounter: Secondary | ICD-10-CM | POA: Diagnosis present

## 2019-04-22 DIAGNOSIS — N179 Acute kidney failure, unspecified: Secondary | ICD-10-CM | POA: Diagnosis present

## 2019-04-22 LAB — CBC
HCT: 28.6 % — ABNORMAL LOW (ref 36.0–46.0)
HCT: 28.5 % — ABNORMAL LOW (ref 36.0–46.0)
Hemoglobin: 9.2 g/dL — ABNORMAL LOW (ref 12.0–15.0)
Hemoglobin: 9.9 g/dL — ABNORMAL LOW (ref 12.0–15.0)
MCH: 25.8 pg — ABNORMAL LOW (ref 26.0–34.0)
MCH: 26.5 pg (ref 26.0–34.0)
MCHC: 32.2 g/dL (ref 30.0–36.0)
MCHC: 34.7 g/dL (ref 30.0–36.0)
MCV: 80.1 fL (ref 80.0–100.0)
MCV: 76.4 fL — ABNORMAL LOW (ref 80.0–100.0)
Platelets: 279 10*3/uL (ref 150–400)
Platelets: 278 10*3/uL (ref 150–400)
RBC: 3.57 MIL/uL — ABNORMAL LOW (ref 3.87–5.11)
RBC: 3.73 MIL/uL — ABNORMAL LOW (ref 3.87–5.11)
RDW: 15.7 % — ABNORMAL HIGH (ref 11.5–15.5)
RDW: 16.2 % — ABNORMAL HIGH (ref 11.5–15.5)
WBC: 10.5 10*3/uL (ref 4.0–10.5)
WBC: 10.9 10*3/uL — ABNORMAL HIGH (ref 4.0–10.5)
nRBC: 0.3 % — ABNORMAL HIGH (ref 0.0–0.2)
nRBC: 0.2 % (ref 0.0–0.2)

## 2019-04-22 LAB — BASIC METABOLIC PANEL
Anion gap: 9 (ref 5–15)
BUN: 32 mg/dL — ABNORMAL HIGH (ref 8–23)
CO2: 20 mmol/L — ABNORMAL LOW (ref 22–32)
Calcium: 8 mg/dL — ABNORMAL LOW (ref 8.9–10.3)
Chloride: 108 mmol/L (ref 98–111)
Creatinine, Ser: 1.94 mg/dL — ABNORMAL HIGH (ref 0.44–1.00)
GFR calc Af Amer: 25 mL/min — ABNORMAL LOW (ref >60)
GFR calc non Af Amer: 21 mL/min — ABNORMAL LOW (ref >60)
Glucose, Bld: 105 mg/dL — ABNORMAL HIGH (ref 70–99)
Potassium: 4.8 mmol/L (ref 3.5–5.1)
Sodium: 137 mmol/L (ref 135–145)

## 2019-04-22 LAB — PREPARE RBC (CROSSMATCH)

## 2019-04-22 LAB — FERRITIN: Ferritin: 26 ng/mL (ref 11–307)

## 2019-04-22 LAB — IRON AND TIBC
Iron: 30 ug/dL (ref 28–170)
Saturation Ratios: 13 % (ref 10.4–31.8)
TIBC: 240 ug/dL — ABNORMAL LOW (ref 250–450)
UIBC: 210 ug/dL

## 2019-04-22 LAB — TROPONIN I (HIGH SENSITIVITY): Troponin I (High Sensitivity): 61 ng/L — ABNORMAL HIGH (ref <18)

## 2019-04-22 LAB — HEMOGLOBIN AND HEMATOCRIT, BLOOD
HCT: 32.4 % — ABNORMAL LOW (ref 36.0–46.0)
Hemoglobin: 10.6 g/dL — ABNORMAL LOW (ref 12.0–15.0)

## 2019-04-22 LAB — VITAMIN B12: Vitamin B-12: 1095 pg/mL — ABNORMAL HIGH (ref 180–914)

## 2019-04-22 LAB — FOLATE: Folate: 215 ng/mL (ref >5.9)

## 2019-04-22 LAB — SARS CORONAVIRUS 2 (TAT 6-24 HRS): SARS Coronavirus 2: NEGATIVE

## 2019-04-22 MED ORDER — TIZANIDINE HCL 4 MG PO TABS
4.0000 mg | ORAL_TABLET | Freq: Every day | ORAL | Status: DC
  Administered 2019-04-23 – 2019-04-25 (×3): 4 mg via ORAL
  Filled 2019-04-22 (×7): qty 1

## 2019-04-22 MED ORDER — HYDRALAZINE HCL 50 MG PO TABS
50.0000 mg | ORAL_TABLET | Freq: Two times a day (BID) | ORAL | Status: DC
  Administered 2019-04-22 – 2019-04-26 (×9): 50 mg via ORAL
  Filled 2019-04-22 (×9): qty 1

## 2019-04-22 MED ORDER — FOLIC ACID 1 MG PO TABS: 1.0000 mg | ORAL_TABLET | Freq: Every day | ORAL | Status: DC

## 2019-04-22 MED ORDER — METOPROLOL TARTRATE 25 MG PO TABS
25.0000 mg | ORAL_TABLET | Freq: Two times a day (BID) | ORAL | Status: DC
  Administered 2019-04-22 – 2019-04-23 (×4): 25 mg via ORAL
  Filled 2019-04-22 (×4): qty 1

## 2019-04-22 MED ORDER — POLYSACCHARIDE IRON COMPLEX 150 MG PO CAPS
150.0000 mg | ORAL_CAPSULE | Freq: Every day | ORAL | Status: DC
  Administered 2019-04-22 – 2019-04-26 (×5): 150 mg via ORAL
  Filled 2019-04-22 (×5): qty 1

## 2019-04-22 MED ORDER — PANTOPRAZOLE SODIUM 40 MG PO TBEC
40.0000 mg | DELAYED_RELEASE_TABLET | Freq: Every day | ORAL | Status: DC
  Administered 2019-04-22 – 2019-04-23 (×2): 40 mg via ORAL
  Filled 2019-04-22 (×2): qty 1

## 2019-04-22 MED ORDER — CYANOCOBALAMIN 500 MCG PO TABS
250.0000 ug | ORAL_TABLET | Freq: Every day | ORAL | Status: DC
  Administered 2019-04-22 – 2019-04-26 (×5): 250 ug via ORAL
  Filled 2019-04-22 (×5): qty 1

## 2019-04-22 MED ORDER — IRON POLYSACCH CMPLX-B12-FA 150-0.025-1 MG PO CAPS: 1.0000 | ORAL_CAPSULE | Freq: Every morning | ORAL | Status: DC

## 2019-04-22 MED ORDER — HYDROCODONE-ACETAMINOPHEN 5-325 MG PO TABS
1.0000 | ORAL_TABLET | Freq: Two times a day (BID) | ORAL | Status: DC
  Administered 2019-04-22 – 2019-04-26 (×9): 1 via ORAL
  Filled 2019-04-22 (×10): qty 1

## 2019-04-22 MED ORDER — SODIUM CHLORIDE 0.9 % IV SOLN
INTRAVENOUS | Status: DC
  Administered 2019-04-22 – 2019-04-24 (×3): via INTRAVENOUS

## 2019-04-22 MED ORDER — ORAL CARE MOUTH RINSE
15.0000 mL | Freq: Two times a day (BID) | OROMUCOSAL | Status: DC
  Administered 2019-04-22 – 2019-04-26 (×8): 15 mL via OROMUCOSAL

## 2019-04-22 MED ORDER — HYDRALAZINE HCL 25 MG PO TABS: 25.0000 mg | ORAL_TABLET | Freq: Three times a day (TID) | ORAL | Status: DC | PRN

## 2019-04-22 MED ORDER — DOCUSATE SODIUM 100 MG PO CAPS
100.0000 mg | ORAL_CAPSULE | Freq: Every day | ORAL | Status: DC
  Administered 2019-04-22 – 2019-04-26 (×4): 100 mg via ORAL
  Filled 2019-04-22 (×4): qty 1

## 2019-04-22 MED ORDER — SERTRALINE HCL 50 MG PO TABS
25.0000 mg | ORAL_TABLET | Freq: Every day | ORAL | Status: DC
  Administered 2019-04-22 – 2019-04-26 (×5): 25 mg via ORAL
  Filled 2019-04-22 (×5): qty 1

## 2019-04-22 MED ORDER — FOLIC ACID 1 MG PO TABS
1.0000 mg | ORAL_TABLET | Freq: Every day | ORAL | Status: DC
  Administered 2019-04-22 – 2019-04-26 (×5): 1 mg via ORAL
  Filled 2019-04-22 (×5): qty 1

## 2019-04-22 MED ORDER — ALLOPURINOL 100 MG PO TABS
300.0000 mg | ORAL_TABLET | Freq: Every day | ORAL | Status: DC
  Administered 2019-04-22 – 2019-04-26 (×5): 300 mg via ORAL
  Filled 2019-04-22 (×2): qty 3
  Filled 2019-04-22: qty 1
  Filled 2019-04-22 (×2): qty 3

## 2019-04-22 NOTE — Progress Notes (Signed)
Physical Therapy Evaluation Patient Details Name: [name] MRN: [id number] DOB: 05/15/1921 Today's Date: 04/22/2019   History of Present Illness  [name] is a 85 y.o. female with medical history significant of hypertension, hx of chronic anemia requiring blood transfusion, GERD, CKD, and depression who presents with concerns of unresponsiveness.  Patient took her nighttime medicines tonight and was found to be difficult to arouse in her chair even though she usually goes straight to bed after her medication, family was unable to wake her.  They then called EMS and was told by the dispatcher to perform CPR.  The patient's daughter did 2 chest compressions but stopped because patient appeared to be breathing.  On EMS arrival, patient had regained consciousness and was found to be hypotensive with systolic in the [id number] with improvement after 500 cc of fluid bolus.  Clinical Impression  Patient is alert to person but unable to answer questions or follow commands such as "can you put your mask on". She has 3/5 strength BLE hip flex and 3+/5 knee ext.   She is able to perform bed mobility supine <> sit with min assist and vc for safety. She transfers sit to stand with RW and min assist. She has good sitting balance with feet on the floor and no UE support. She has fair standing balance with RW assist. She is able to ambulate 20 feet with RW and min assist. She will benefit from skilled PT to improve mobility and safety.   Follow Up Recommendations Home health PT    Equipment Recommendations  Rolling walker with 5" wheels    Recommendations for Other Services       Precautions / Restrictions        Mobility  Bed Mobility Overal bed mobility: Needs Assistance Bed Mobility: Supine to Sit;Sit to Supine     Supine to sit: Min assist Sit to supine: Min assist   General bed mobility comments: VC needed for safety  Transfers Overall transfer level: Needs assistance Equipment used:  4-wheeled walker Transfers: Sit to/from Stand Sit to Stand: Min assist         General transfer comment: cues for sit to stand  Ambulation/Gait Ambulation/Gait assistance: Min Web designer (Feet): 20 Feet Assistive device: Rolling walker (2 wheeled)          Stairs            Wheelchair Mobility    Modified Rankin (Stroke Patients Only)       Balance Overall balance assessment: Modified Independent                                           Pertinent Vitals/Pain      Home Living Family/patient expects to be discharged to:: Private residence Living Arrangements: Children Available Help at Discharge: Family Type of Home: (unsure) Home Access: (unsure)     Home Layout: (unsure)        Prior Function Level of Independence: (unsure)               Hand Dominance        Extremity/Trunk Assessment   Upper Extremity Assessment Upper Extremity Assessment: Overall WFL for tasks assessed    Lower Extremity Assessment Lower Extremity Assessment: Overall WFL for tasks assessed       Communication   Communication: No difficulties  Cognition Arousal/Alertness: Awake/alert Behavior During Therapy:  WFL for tasks assessed/performed Overall Cognitive Status: No family/caregiver present to determine baseline cognitive functioning                                 General Comments: (Patient followed 50% of cues, she unable to answer questions)      General Comments      Exercises     Assessment/Plan    PT Assessment Patient needs continued PT services  PT Problem List Decreased strength;Decreased balance;Decreased mobility       PT Treatment Interventions Gait training;Functional mobility training;Therapeutic activities    PT Goals (Current goals can be found in the Care Plan section)  Acute Rehab PT Goals Patient Stated Goal: no goals stated PT Goal Formulation: Patient unable to participate in  goal setting Time For Goal Achievement: 05/06/19 Potential to Achieve Goals: Good    Frequency Min 2X/week   Barriers to discharge        Co-evaluation               AM-PAC PT "6 Clicks" Mobility  Outcome Measure Help needed turning from your back to your side while in a flat bed without using bedrails?: A Little Help needed moving from lying on your back to sitting on the side of a flat bed without using bedrails?: A Little Help needed moving to and from a bed to a chair (including a wheelchair)?: A Little Help needed standing up from a chair using your arms (e.g., wheelchair or bedside chair)?: A Little Help needed to walk in hospital room?: A Little Help needed climbing 3-5 steps with a railing? : A Lot 6 Click Score: 17    End of Session Equipment Utilized During Treatment: Gait belt Activity Tolerance: Patient limited by fatigue Patient left: in bed;with bed alarm set Nurse Communication: (Patient's bed and gown was wet with urine) PT Visit Diagnosis: Unsteadiness on feet (R26.81);Muscle weakness (generalized) (M62.81);Difficulty in walking, not elsewhere classified (R26.2)    Time: 1645-1710 PT Time Calculation (min) (ACUTE ONLY): 25 min   Charges:   PT Evaluation $PT Eval Low Complexity: 1 Low PT Treatments $Therapeutic Activity: 8-22 mins          Alanson Puls, PT DPT 04/22/2019, 5:25 PM

## 2019-04-22 NOTE — Progress Notes (Addendum)
[facility] NAME: [name]    MR#:  [id number]  DATE OF BIRTH:  [birth year]  SUBJECTIVE:   Patient came in after she had episode of unresponsiveness at home. Granddaughter was present. She is awake alert oriented to herself. Hard on hearing.  Status post one unit blood transfusion. Overall doing well. No new issues per RN. Blood pressure somewhat elevated REVIEW OF SYSTEMS:   Review of Systems  Constitutional: Positive for malaise/fatigue. Negative for chills, fever and weight loss.  HENT: Negative for ear discharge, ear pain and nosebleeds.   Eyes: Negative for blurred vision, pain and discharge.  Respiratory: Negative for sputum production, shortness of breath, wheezing and stridor.   Cardiovascular: Negative for chest pain, palpitations, orthopnea and PND.  Gastrointestinal: Negative for abdominal pain, diarrhea, nausea and vomiting.  Genitourinary: Negative for frequency and urgency.  Musculoskeletal: Positive for back pain. Negative for joint pain.  Neurological: Positive for weakness. Negative for sensory change, speech change and focal weakness.  Psychiatric/Behavioral: Negative for depression and hallucinations. The patient is not nervous/anxious.    Tolerating Diet: yes Tolerating PT: pending  DRUG ALLERGIES:  No Known Allergies  VITALS:  Blood pressure (!) 188/68, pulse 68, temperature (!) 97.4 F (36.3 C), temperature source Oral, resp. rate 16, height 5\' 9"  (1.753 m), weight 54.4 kg, SpO2 99 %.  PHYSICAL EXAMINATION:   Physical Exam  GENERAL:  85 y.o.-year-old patient lying in the bed with no acute distress.  EYES: Pupils equal, round, reactive to light and accommodation. No scleral icterus.  HEENT: Head atraumatic, normocephalic. Oropharynx and nasopharynx clear. Pallor+ hard on hearing NECK:  Supple, no jugular venous distention. No thyroid enlargement, no tenderness.  LUNGS: Normal breath sounds  bilaterally, no wheezing, rales, rhonchi. No use of accessory muscles of respiration.  CARDIOVASCULAR: S1, S2 normal. No murmurs, rubs, or gallops.  ABDOMEN: Soft, nontender, nondistended. Bowel sounds present. No organomegaly or mass.  EXTREMITIES: No cyanosis, clubbing or edema b/l.    NEUROLOGIC: moves all extremities well. Grossly nonfocal PSYCHIATRIC:  patient is alert and oriented x2-- some cognitive decline  SKIN: No obvious rash, lesion, or ulcer.   LABORATORY PANEL:  CBC Recent Labs  Lab 04/22/19 0938  WBC 10.9*  HGB 9.9*  HCT 28.5*  PLT 278    Chemistries  Recent Labs  Lab 04/22/19 0301  NA 137  K 4.8  CL 108  CO2 20*  GLUCOSE 105*  BUN 32*  CREATININE 1.94*  CALCIUM 8.0*   Cardiac Enzymes No results for input(s): TROPONINI in the last 168 hours. RADIOLOGY:  No results found. ASSESSMENT AND PLAN:  BREYONA [name] is a 85 y.o. female with medical history significant of hypertension, hx of chronic anemia requiring blood transfusion, GERD, CKD, and depression who presents with concerns of unresponsiveness.  *Altered mental status likely secondary to hypotension due to acute on chronic anemia -received gentle fluid -came in with hemoglobin of 6.8-- transfused 1 unit pRBC-- 9.2--- 9.9 -patient is not having any active bleed - Has hx of anemia requiring transfusion back in 2018.  Had EGD at that time which did not show any GI bleeding and showed only mild schatzki ring. -  iron panel wnl  - vitamin B12 1095 - resumed hydralazine and metoprolol   *AKI on CKD stage II - suspect from dehydration vs acute blood loss, also noted to have daily Mobic and voltaren gel on med list - hold mobic and voltaren gel -  avoid nephrotoxic agent creatinine much improved -2.12--1.94 -baseline creatinine 1.4-1.6  Uncontrolled high blood pressure -resume hydralazine and beta-blockers -PRN hydralazine  *Bedbug infestation - bedbug noted on exam by staff but no noted  significant rash seen. - family was informed to clean linens and mattress at home - no complaints of puritis   *Hx of mild schatzki ring - Speech evaluation appreciated  *hx of Gout - continue allopurinol   *Depression -continue zoloft  *Cognitive impairment -only oriented to self  -patient has some hearing impairment also.  I will have physical therapy see patient in consultation.   Procedures: none so far Family communication :  Left msg for granddaughter Candace Consults : none Discharge Disposition : likely home CODE STATUS: full DVT Prophylaxis :SCD in the setting of anemia  TOTAL TIME TAKING CARE OF THIS PATIENT: *30 minutes.  >50% time spent on counselling and coordination of care  POSSIBLE D/C IN *1-2* DAYS, DEPENDING ON CLINICAL CONDITION.  Note: This dictation was prepared with Dragon dictation along with smaller phrase technology. Any transcriptional errors that result from this process are unintentional.  Fritzi Mandes M.D on 04/22/2019 at 3:00 PM  Between 7am to 6pm - Pager - [phone]  After 6pm go to [email or web address]  [facility]   CC: Primary care physician; Jodi Marble, MDPatient ID: [name], female   DOB: Aug 05, 1920, 85 y.o.   MRN: [id number]

## 2019-04-22 NOTE — Progress Notes (Signed)
SATURATION QUALIFICATIONS: (This note is used to comply with regulatory documentation for home oxygen)  Patient Saturations on Room Air 95  Patient Saturations on Room Air while Ambulating = 79  Patient Saturations on  2 Liters of oxygen while Ambulating = 94  Please briefly explain why patient needs oxygen at home.

## 2019-04-22 NOTE — ED Notes (Signed)
Pt contact:  Candice [phone]

## 2019-04-22 NOTE — ED Notes (Signed)
Pt provided breakfast tray; able to feed self. 

## 2019-04-22 NOTE — Evaluation (Signed)
Clinical/Bedside Swallow Evaluation Patient Details  Name: [name] MRN: [id number] Date of Birth: August 22, 1920  Today's Date: 04/22/2019 Time: SLP Start Time (ACUTE ONLY): 1100 SLP Stop Time (ACUTE ONLY): 1200 SLP Time Calculation (min) (ACUTE ONLY): 60 min  Past Medical History:  Past Medical History:  Diagnosis Date  . Arthritis   . GERD (gastroesophageal reflux disease)   . Hypertension   . Neck fracture ([place]) 2015   C2   Past Surgical History:  Past Surgical History:  Procedure Laterality Date  . ABDOMINAL HYSTERECTOMY    . CHOLECYSTECTOMY  08/06/14  . ESOPHAGOGASTRODUODENOSCOPY (EGD) WITH PROPOFOL N/A 08/03/2016   Procedure: ESOPHAGOGASTRODUODENOSCOPY (EGD) WITH PROPOFOL;  Surgeon: Ronald Lobo, MD;  Location: [facility];  Service: Gastroenterology;  Laterality: N/A;  . EYE SURGERY  2015   cataract   HPI:  Pt is a 85 y.o. female with medical history significant of GERD, hypertension, hx of chronic anemia requiring blood transfusion, CKD, and depression who presents with concerns of unresponsiveness.  History is obtained from her granddaughter, Candace by phone given that patient does not quite recall the event and is only oriented to self.  She reports that patient took her nighttime medicines tonight and was found to be difficult to arouse in her chair. On EMS arrival, patient had regained consciousness and was found to be hypotensive with systolic in the 123XX123 with improvement after 500 cc of fluid bolus. Patient has had decreased p.o. intake for the past 2 days per family. CXR: "No edema or consolidation". Unsure of pt's baseline Cognitive status.    Assessment / Plan / Recommendation Clinical Impression  Pt appears to present w/ Oral phase dysphagia impacted by declined Cognitive awareness/status and Edentulous status - pt was confused about her Dentition status stating "I have my own teeth" to SLP. Upon modifying the food consistency, pt appeared to adequately tolerate  trials of thin liquids and purees w/ no overt, clinical s/s of aspiration. However, pt's oral phase was c/b intermittent decreased attention to bolus awareness, oral holding, porlonged A-P transfer, and piecemealing swallows. Pt cleared orally given time and alternating foods/liquids w/ trials of thin liquids and purees, however, reduced gumming/mashing and oral holding was noted to increase w/ trials of increased texture. Pt exhibited more of a mashing pattern moving food to anterior portion of mouth where it remained as residue on her tongue when oral cavity was checked -- pieces of broken down solids appeared to remain orally. She exhibited min Confusion/awareness during the oral prep and oral phase of po trials, especially foods. Pt consumed single trials w/ support w/ no immediate, overt s/s of aspiration noted; no decline in vocal quality or respiratory effort/O2 sats. She consumed thin liquids via Cup/Straw, and Purees. ST alternated food/liquid trials to aid clearing. Pt held Cup w/ support from SLP.  Recommend a Dysphagia level 1 (puree foods) w/ Thin liquids w/ monitoring and support d/t apparent Cognitive decline/behavior; general aspiration precautions; upright position; reduced environmental distractions and alternate bites w/ sips of liquid for oral clearing. Pills Crushed in Puree for safer, easier swallowing. NSG to check for oral clearing during/post meals. Recommend Dietician consult for nutritional supplements -- drink forms. NSG consulted and agreed.  SLP Visit Diagnosis: Dysphagia, oral phase (R13.11)(unsure if Cognitive decline)    Aspiration Risk  Mild aspiration risk;Risk for inadequate nutrition/hydration(but reduced following general precs)    Diet Recommendation  Dysphagia level 1 (PUREE w/ gravies added); Thin liquids. General aspiration precautions and monitoring at meals for support  Medication Administration: Crushed with puree(for easier safer swallowing)    Other   Recommendations Recommended Consults: (Dietician f/u for support) Oral Care Recommendations: Oral care BID;Oral care before and after PO;Staff/trained caregiver to provide oral care Other Recommendations: (n/a)   Follow up Recommendations None      Frequency and Duration min 2x/week  1 week       Prognosis Prognosis for Safe Diet Advancement: Fair Barriers to Reach Goals: (unsure if Cognitive decline baseline)      Swallow Study   General Date of Onset: 04/21/19 HPI: Pt is a 85 y.o. female with medical history significant of GERD, hypertension, hx of chronic anemia requiring blood transfusion, CKD, and depression who presents with concerns of unresponsiveness.  History is obtained from her granddaughter, Candace by phone given that patient does not quite recall the event and is only oriented to self.  She reports that patient took her nighttime medicines tonight and was found to be difficult to arouse in her chair. On EMS arrival, patient had regained consciousness and was found to be hypotensive with systolic in the 123XX123 with improvement after 500 cc of fluid bolus. Patient has had decreased p.o. intake for the past 2 days per family. CXR: "No edema or consolidation". Unsure of pt's baseline Cognitive status.  Type of Study: Bedside Swallow Evaluation Previous Swallow Assessment: none reported Diet Prior to this Study: Regular;Thin liquids Temperature Spikes Noted: No(wbc 10.9) Respiratory Status: Room air History of Recent Intubation: No Behavior/Cognition: Alert;Cooperative;Pleasant mood;Distractible;Requires cueing(unsure of pt's baseline Cognitive status/fxing) Oral Cavity Assessment: Within Functional Limits Oral Care Completed by SLP: Yes Oral Cavity - Dentition: Edentulous Vision: Functional for self-feeding Self-Feeding Abilities: Able to feed self;Needs assist;Needs set up Patient Positioning: Upright in bed(needed positioning) Baseline Vocal Quality: Normal;Low vocal  intensity Volitional Cough: Cognitively unable to elicit Volitional Swallow: Unable to elicit    Oral/Motor/Sensory Function Overall Oral Motor/Sensory Function: Within functional limits(no unilateral deficits or lingual weakness)   Ice Chips Ice chips: Within functional limits Presentation: Spoon(fed; 2 trials)   Thin Liquid Thin Liquid: Impaired Presentation: Cup;Self Fed;Straw(5 trials via each) Oral Phase Impairments: Poor awareness of bolus(intermittently) Oral Phase Functional Implications: Prolonged oral transit;Oral holding(intermittently) Pharyngeal  Phase Impairments: (none) Other Comments: piecemealing swallows    Nectar Thick Nectar Thick Liquid: Not tested   Honey Thick Honey Thick Liquid: Not tested   Puree Puree: Impaired Presentation: Spoon(fed; 10+ trials) Oral Phase Impairments: Poor awareness of bolus Oral Phase Functional Implications: Prolonged oral transit Pharyngeal Phase Impairments: (none) Other Comments: piecemealing swallows   Solid     Solid: Impaired(minced/broken down) Presentation: Spoon(fed; 4 trials) Oral Phase Impairments: Poor awareness of bolus;Impaired mastication Oral Phase Functional Implications: Prolonged oral transit;Impaired mastication;Oral holding Pharyngeal Phase Impairments: (none) Other Comments: piecemealing swallows       Orinda Kenner, MS, CCC-SLP Emett Stapel 04/22/2019,4:11 PM

## 2019-04-23 DIAGNOSIS — G9340 Encephalopathy, unspecified: Secondary | ICD-10-CM

## 2019-04-23 DIAGNOSIS — N1832 Chronic kidney disease, stage 3b: Secondary | ICD-10-CM

## 2019-04-23 DIAGNOSIS — K922 Gastrointestinal hemorrhage, unspecified: Secondary | ICD-10-CM

## 2019-04-23 LAB — CBC
HCT: 23.1 % — ABNORMAL LOW (ref 36.0–46.0)
Hemoglobin: 8 g/dL — ABNORMAL LOW (ref 12.0–15.0)
MCH: 26.3 pg (ref 26.0–34.0)
MCHC: 34.6 g/dL (ref 30.0–36.0)
MCV: 76 fL — ABNORMAL LOW (ref 80.0–100.0)
Platelets: 267 10*3/uL (ref 150–400)
RBC: 3.04 MIL/uL — ABNORMAL LOW (ref 3.87–5.11)
RDW: 16.4 % — ABNORMAL HIGH (ref 11.5–15.5)
WBC: 9.2 10*3/uL (ref 4.0–10.5)
nRBC: 0.3 % — ABNORMAL HIGH (ref 0.0–0.2)

## 2019-04-23 MED ORDER — PANTOPRAZOLE SODIUM 40 MG PO TBEC
40.0000 mg | DELAYED_RELEASE_TABLET | Freq: Two times a day (BID) | ORAL | Status: DC
  Administered 2019-04-23 – 2019-04-26 (×5): 40 mg via ORAL
  Filled 2019-04-23 (×6): qty 1

## 2019-04-23 NOTE — Plan of Care (Addendum)
Problem: Education: Goal: Knowledge of General Education information will improve Description: Including pain rating scale, medication(s)/side effects and non-pharmacologic comfort measures Outcome: Not Progressing Pt is A/O to self and does not understands POC.  Will continue to monitor and offer support.    Problem: Health Behavior/Discharge Planning: Goal: Ability to manage health-related needs will improve Outcome: Not Progressing Pt admitted from home for unresponsiveness and anemia.  She was transfused 2 units of PRBC's prior shift.  Throughout the night she has had 2 large burgundy stools with clots.  Stark Klein, NP paged and notified.  Will continue to monitor and assess.   Problem: Clinical Measurements: Goal: Ability to maintain clinical measurements within normal limits will improve Outcome: Progressing Pt admitted from home for unresponsiveness and anemia.  She was transfused 2 units of PRBC's prior shift.  Throughout the night she has had 2 large burgundy stools with clots.  Stark Klein, NP paged and notified.  Current H/H is 10.6/ 32.4 as of 2229 on 04/22/2019.  Will continue to monitor and assess.    Problem: Clinical Measurements: Goal: Will remain free from infection Outcome: Progressing S/Sx of infection monitored and assessed q-shift/ PRN, along with VS.  Pt has remained afebrile thus far.  Will continue to monitor and assess.    Problem: Clinical Measurements: Goal: Respiratory complications will improve Outcome: Progressing Respiratory status monitor and assessed q-shift/ PRN, along with VS.  Pt is on room air with O2 saturations at 100% and respiration rate of 18-20 breaths per minute.  Will continue to monitor and assess.    Problem: Clinical Measurements: Goal: Cardiovascular complication will be avoided Outcome: Progressing Pt was hypertensive beginning of shift.  Her anti-hypertensive meds were given per MD's orders.  VS WNL.  Will continue to monitor and assess    Problem: Activity: Goal: Risk for activity intolerance will decrease 04/23/2019 0422 by Bjorn Pippin, RN Outcome: Progressing Pt is unable to get OOB.  She is unable to reposition herself in the bed and needs the assistance from RN staff.  Will continue to monitor and assess.   Problem: Nutrition: Goal: Adequate nutrition will be maintained 04/23/2019 0422 by Bjorn Pippin, RN Outcome: Progressing Pt is a dysphasia diet and is tolerating it well.  Will continue to monitor and assess.   Problem: Coping: Goal: Level of anxiety will decrease 04/23/2019 0422 by Bjorn Pippin, RN Outcome: Progressing Pt has not expressed c/o of anxiety r/t her hospitalization.  Will continue to monitor and assess.   Problem: Elimination: Goal: Will not experience complications related to bowel motility 04/23/2019 0422 by Bjorn Pippin, RN Outcome: Progressing Pt is incontinent of both bowel and bladder.  She has had 2 large burgudy BM's with blood clots.  Stark Klein, NP paged and notified.  Will continue to monitor and assess.   Problem: Elimination: Goal: Will not experience complications related to urinary retention 04/23/2019 0422 by Bjorn Pippin, RN Outcome: Progressing Pt has had 2 large burgundy BM's this shift.  She is incontinent of both her bowel and bladder.  Will continue to monitor and assess.    Problem: Pain Managment: Goal: General experience of comfort will improve 04/23/2019 0422 by Bjorn Pippin, RN Outcome: Progressing Pt has denied pain thus far.  Will continue to monitor and assess.   Problem: Safety: Goal: Ability to remain free from injury will improve 04/23/2019 0422 by Bjorn Pippin, RN Outcome: Progressing Instructed pt to utilize RN call light for assistance.  Hourly rounds performed.  Bed alarm implemented to keep pt safe from falls.  Bed in lowest position, locked with two upper/ one lower side rails engaged.  Belongings and call light within reach.     Problem: Skin  Integrity: Goal: Risk for impaired skin integrity will decrease 04/23/2019 0349 by Bjorn Pippin, RN Outcome: Progressing Skin integrity monitored and assessed q-shift/ PRN.  Instructed pt to turn q2 hours to prevent further skin impairment.  Tubes and drains assessed for device related pressure sores.  Dressing changes performed per MD's orders.  Will continue to monitor and assess.

## 2019-04-23 NOTE — Progress Notes (Signed)
PROGRESS NOTE    [name]  [id number] DOB: [birth year] DOA: 04/21/2019 PCP: Jodi Marble, MD    Brief Narrative:  85 y/o female with history of HTN, chronic anemia, CKD 3b, GERD, who presented to the hospital when she became unresponsive. She was found to be hypotensive and had acute on chronic anemia.    Assessment & Plan:   Active Problems:   Depression   Acute anemia   Bedbug bite   Hypotension   Gout   AKI (acute kidney injury) ([place])   Cognitive impairment   1. Acute encephalopathy secondary to hypotension low hemoglobin.  Patient presented with a hemoglobin of 6.8.  She was noted to be hypotensive.  Patient was volume resuscitated and received PRBC transfusion.  Overall hemoglobin has improved.  She appears to be approaching baseline. 2. Acute on chronic anemia.  Likely has element of acute blood loss superimposed on anemia of chronic kidney disease.  Hemoglobin was 6.8 on admission.  This improved to 1 unit PRBC transfusion.  She had an EGD approximately 2 years ago that showed mild Schatzki ring but no evidence of GI bleeding.  Hemoglobin has since declined since this morning.  Continue to monitor and transfuse further as necessary. 3. Possible GI bleeding.  Reported to have dark red-colored stools.  Stool FOBT has been ordered.  She is not had any further bowel movements today.  Continue on Protonix.  She was taking NSAIDs prior to admission.  If evidence of gross bleeding continues, may need to reconsult GI. 4. AKI on CKD stage IIIb.  Baseline creatinine 1.4-1.6.  On admission, creatinine noted to be 2.1.  Likely related to hypotension as well as NSAID use.  Continue to monitor. 5. Hypertension.  Stable on hydralazine and metoprolol. 6. Depression.  Continue Zoloft 7. Bedbug infestation.  Noted by staff that she had significant bedbugs, but no rash was seen.  Family was informed to clean linens and mattress at home.  No complaints of pruritus at this time.   DVT  prophylaxis: SCD Code Status: full code Family Communication: discussed with son at the bedside Disposition Plan: discharge home once improved   Consultants:     Procedures:     Antimicrobials:       Subjective: Per staff, patient had some dark red bowel movements overnight. She has not had any bowel movements since this morning.  Objective: Vitals:   04/22/19 2054 04/22/19 2212 04/23/19 0433 04/23/19 1201  BP: (!) 178/65 (!) 182/75 (!) 142/59 (!) 142/57  Pulse: 64 65 69 67  Resp: 20 20 18 16   Temp: 98.2 F (36.8 C) 98.3 F (36.8 C) 99.4 F (37.4 C) 98 F (36.7 C)  TempSrc: Oral Oral Oral Oral  SpO2: 100% 100% 100% 99%  Weight:      Height:        Intake/Output Summary (Last 24 hours) at 04/23/2019 1755 Last data filed at 04/22/2019 2206 Gross per 24 hour  Intake -  Output 1 ml  Net -1 ml   Filed Weights   04/21/19 2054  Weight: 54.4 kg    Examination:  General exam: Appears calm and comfortable  Respiratory system: Clear to auscultation. Respiratory effort normal. Cardiovascular system: S1 & S2 heard, RRR. No JVD, murmurs, rubs, gallops or clicks. No pedal edema. Gastrointestinal system: Abdomen is nondistended, soft and nontender. No organomegaly or masses felt. Normal bowel sounds heard. Central nervous system: Alert and oriented. No focal neurological deficits. Extremities: Symmetric 5 x 5 power.  Skin: No rashes, lesions or ulcers Psychiatry: Judgement and insight appear normal. Mood & affect appropriate.     Data Reviewed: I have personally reviewed following labs and imaging studies  CBC: Recent Labs  Lab 04/21/19 2102 04/22/19 0301 04/22/19 0938 04/22/19 2229 04/23/19 1654  WBC 10.7* 10.5 10.9*  --  9.2  HGB 6.8* 9.2* 9.9* 10.6* 8.0*  HCT 20.7* 28.6* 28.5* 32.4* 23.1*  MCV 79.9* 80.1 76.4*  --  76.0*  PLT 292 279 278  --  [id number]   Basic Metabolic Panel: Recent Labs  Lab 04/21/19 2102 04/22/19 0301  NA 139 137  K 3.7 4.8  CL 110  108  CO2 21* 20*  GLUCOSE 104* 105*  BUN 34* 32*  CREATININE 2.12* 1.94*  CALCIUM 7.8* 8.0*   GFR: Estimated Creatinine Clearance: 14.6 mL/min (A) (by C-G formula based on SCr of 1.94 mg/dL (H)). Liver Function Tests: No results for input(s): AST, ALT, ALKPHOS, BILITOT, PROT, ALBUMIN in the last 168 hours. No results for input(s): LIPASE, AMYLASE in the last 168 hours. No results for input(s): AMMONIA in the last 168 hours. Coagulation Profile: No results for input(s): INR, PROTIME in the last 168 hours. Cardiac Enzymes: No results for input(s): CKTOTAL, CKMB, CKMBINDEX, TROPONINI in the last 168 hours. BNP (last 3 results) No results for input(s): PROBNP in the last 8760 hours. HbA1C: No results for input(s): HGBA1C in the last 72 hours. CBG: No results for input(s): GLUCAP in the last 168 hours. Lipid Profile: No results for input(s): CHOL, HDL, LDLCALC, TRIG, CHOLHDL, LDLDIRECT in the last 72 hours. Thyroid Function Tests: No results for input(s): TSH, T4TOTAL, FREET4, T3FREE, THYROIDAB in the last 72 hours. Anemia Panel: Recent Labs    04/21/19 2102 04/22/19 0301  VITAMINB12  --  1,095*  FOLATE 215.0  --   FERRITIN 26  --   TIBC 240*  --   IRON 30  --    Sepsis Labs: No results for input(s): PROCALCITON, LATICACIDVEN in the last 168 hours.  Recent Results (from the past 240 hour(s))  SARS CORONAVIRUS 2 (TAT 6-24 HRS) Nasopharyngeal Nasopharyngeal Swab     Status: None   Collection Time: 04/21/19 10:51 PM   Specimen: Nasopharyngeal Swab  Result Value Ref Range Status   SARS Coronavirus 2 NEGATIVE NEGATIVE Final    Comment: (NOTE) SARS-CoV-2 target nucleic acids are NOT DETECTED. The SARS-CoV-2 RNA is generally detectable in upper and lower respiratory specimens during the acute phase of infection. Negative results do not preclude SARS-CoV-2 infection, do not rule out co-infections with other pathogens, and should not be used as the sole basis for treatment or  other patient management decisions. Negative results must be combined with clinical observations, patient history, and epidemiological information. The expected result is Negative. Fact Sheet for Patients: SugarRoll.be Fact Sheet for Healthcare Providers: https://www.woods-mathews.com/ This test is not yet approved or cleared by the [place] FDA and  has been authorized for detection and/or diagnosis of SARS-CoV-2 by FDA under an Emergency Use Authorization (EUA). This EUA will remain  in effect (meaning this test can be used) for the duration of the COVID-19 declaration under Section 56 4(b)(1) of the Act, 21 U.S.C. section 360bbb-3(b)(1), unless the authorization is terminated or revoked sooner. Performed at [facility], [place] [address] [place], [place] [address]          Radiology Studies: No results found.      Scheduled Meds: . allopurinol  300 mg Oral Daily  .  docusate sodium  100 mg Oral Daily  . iron polysaccharides  150 mg Oral Daily   And  . folic acid  1 mg Oral Daily   And  . vitamin B-12  250 mcg Oral Daily  . hydrALAZINE  50 mg Oral BID  . HYDROcodone-acetaminophen  1 tablet Oral BID  . mouth rinse  15 mL Mouth Rinse BID  . metoprolol tartrate  25 mg Oral BID  . pantoprazole  40 mg Oral BID AC  . sertraline  25 mg Oral Q breakfast  . tiZANidine  4 mg Oral QHS   Continuous Infusions: . sodium chloride 50 mL/hr at 04/23/19 0650     LOS: 2 days    Time spent: 42mins    Kathie Dike, MD [facility]   If 7PM-7AM, please contact night-coverage [email or web address]  04/23/2019, 5:55 PM

## 2019-04-24 DIAGNOSIS — D649 Anemia, unspecified: Secondary | ICD-10-CM

## 2019-04-24 LAB — CBC
HCT: 19.3 % — ABNORMAL LOW (ref 36.0–46.0)
HCT: 31.9 % — ABNORMAL LOW (ref 36.0–46.0)
Hemoglobin: 6.8 g/dL — ABNORMAL LOW (ref 12.0–15.0)
Hemoglobin: 11 g/dL — ABNORMAL LOW (ref 12.0–15.0)
MCH: 26.5 pg (ref 26.0–34.0)
MCH: 26.6 pg (ref 26.0–34.0)
MCHC: 35.2 g/dL (ref 30.0–36.0)
MCHC: 34.5 g/dL (ref 30.0–36.0)
MCV: 75.1 fL — ABNORMAL LOW (ref 80.0–100.0)
MCV: 77.2 fL — ABNORMAL LOW (ref 80.0–100.0)
Platelets: 212 10*3/uL (ref 150–400)
Platelets: 242 10*3/uL (ref 150–400)
RBC: 2.57 MIL/uL — ABNORMAL LOW (ref 3.87–5.11)
RBC: 4.13 MIL/uL (ref 3.87–5.11)
RDW: 16.3 % — ABNORMAL HIGH (ref 11.5–15.5)
RDW: 15.8 % — ABNORMAL HIGH (ref 11.5–15.5)
WBC: 7.4 10*3/uL (ref 4.0–10.5)
WBC: 10.9 10*3/uL — ABNORMAL HIGH (ref 4.0–10.5)
nRBC: 0.3 % — ABNORMAL HIGH (ref 0.0–0.2)
nRBC: 0.2 % (ref 0.0–0.2)

## 2019-04-24 LAB — BASIC METABOLIC PANEL
Anion gap: 6 (ref 5–15)
BUN: 23 mg/dL (ref 8–23)
CO2: 22 mmol/L (ref 22–32)
Calcium: 7.7 mg/dL — ABNORMAL LOW (ref 8.9–10.3)
Chloride: 113 mmol/L — ABNORMAL HIGH (ref 98–111)
Creatinine, Ser: 1.23 mg/dL — ABNORMAL HIGH (ref 0.44–1.00)
GFR calc Af Amer: 43 mL/min — ABNORMAL LOW (ref >60)
GFR calc non Af Amer: 37 mL/min — ABNORMAL LOW (ref >60)
Glucose, Bld: 109 mg/dL — ABNORMAL HIGH (ref 70–99)
Potassium: 3.5 mmol/L (ref 3.5–5.1)
Sodium: 141 mmol/L (ref 135–145)

## 2019-04-24 LAB — OCCULT BLOOD X 1 CARD TO LAB, STOOL: Fecal Occult Bld: POSITIVE — AB

## 2019-04-24 LAB — PREPARE RBC (CROSSMATCH)

## 2019-04-24 MED ORDER — SODIUM CHLORIDE 0.9% IV SOLUTION
Freq: Once | INTRAVENOUS | Status: AC
  Administered 2019-04-24: 06:00:00 via INTRAVENOUS

## 2019-04-24 MED ORDER — SODIUM CHLORIDE 0.9 % IV SOLN
510.0000 mg | Freq: Once | INTRAVENOUS | Status: AC
  Administered 2019-04-24: 510 mg via INTRAVENOUS
  Filled 2019-04-24: qty 17

## 2019-04-24 NOTE — TOC Initial Note (Signed)
Transition of Care [facility]) - Initial/Assessment Note    Patient Details  Name: [name] MRN: [id number] Date of Birth: 14-Apr-1921  Transition of [facility]) CM/SW Contact:    Shade Flood, LCSW Phone Number: 04/24/2019, 3:43 PM  Clinical Narrative:                  Pt admitted from home. She lives with her daughter in law. PT recommending HH PT at dc. Spoke with DIL by phone. She requests [facility]. Referral made to [place] at [facility]. MD anticipating dc in 2-3 days. TOC will follow.  Expected Discharge Plan: [place] Barriers to Discharge: Continued Medical Work up   Patient Goals and CMS Choice   CMS Medicare.gov Compare Post Acute Care list provided to:: Patient Represenative (must comment) Choice offered to / list presented to : Adult Children  Expected Discharge Plan and Services Expected Discharge Plan: [place] Choice: [place] arrangements for the past 2 months: [place] Arranged: RN, PT [place] Agency: [place] ([facility]) Date HH Agency Contacted: 04/24/19   Representative spoke with at [place]  Prior Living Arrangements/Services Living arrangements for the past 2 months: [place] Lives with:: Adult Children Patient language and need for interpreter reviewed:: Yes Do you feel safe going back to the place where you live?: Yes      Need for Family Participation in Patient Care: Yes (Comment) Care giver support system in place?: Yes (comment) Current home services: DME Criminal Activity/Legal Involvement Pertinent to Current Situation/Hospitalization: No - Comment as needed  Activities of Daily Living Home Assistive Devices/Equipment: Cane (specify quad or straight) ADL Screening (condition at time of admission) Patient's cognitive ability adequate to safely complete daily activities?: Yes Is the patient deaf or have  difficulty hearing?: No Does the patient have difficulty seeing, even when wearing glasses/contacts?: No Does the patient have difficulty concentrating, remembering, or making decisions?: Yes Patient able to express need for assistance with ADLs?: Yes Does the patient have difficulty dressing or bathing?: Yes Independently performs ADLs?: No Communication: Needs assistance Dressing (OT): Needs assistance Grooming: Needs assistance Feeding: Needs assistance Bathing: Needs assistance In/Out Bed: Needs assistance Walks in Home: Needs assistance Does the patient have difficulty walking or climbing stairs?: Yes Weakness of Legs: Both Weakness of Arms/Hands: Both  Permission Sought/Granted                  Emotional Assessment Appearance:: Appears stated age Attitude/Demeanor/Rapport: Engaged Affect (typically observed): Pleasant Orientation: : Oriented to Self, Oriented to Place Alcohol / Substance Use: Not Applicable Psych Involvement: No (comment)  Admission diagnosis:  Syncope, unspecified syncope type [R55] Anemia, unspecified type [D64.9] Patient Active Problem List   Diagnosis Date Noted  . Bedbug bite 04/22/2019  . Hypotension 04/22/2019  . Gout 04/22/2019  . AKI (acute kidney injury) ([place]) 04/22/2019  . Cognitive impairment 04/22/2019  . Acute anemia 04/21/2019  . HTN (hypertension) 08/02/2016  . UGIB (upper gastrointestinal bleed) 08/02/2016  . Acute blood loss anemia 08/02/2016  . Climacteric arthritis, lower leg 03/22/2016  . Depression 03/22/2016  . Esophageal reflux 03/22/2016  . Osteoarthritis of knee 09/07/2015  . Closed fracture of second cervical vertebra ([place]) 09/01/2015  . Abdominal pain 03/17/2015  .  Chronic kidney disease 04/28/2014   PCP:  Jodi Marble, MD Pharmacy:   [id number] [address], [place] [address] Phone: [phone] Fax: [phone]  [place], [place]  [address] [place] [address] Phone: [phone] Fax: [phone]     Social Determinants of Health (SDOH) Interventions    Readmission Risk Interventions Readmission Risk Prevention Plan 04/24/2019  Medication Screening Complete  Transportation Screening Complete  Some recent data might be hidden

## 2019-04-24 NOTE — Progress Notes (Signed)
PROGRESS NOTE    [name]  [id number] DOB: Nov 28, 1920 DOA: 04/21/2019 PCP: Jodi Marble, MD    Brief Narrative:  85 y/o female with history of HTN, chronic anemia, CKD 3b, GERD, who presented to the hospital when she became unresponsive. She was found to be hypotensive and had acute on chronic anemia.    Assessment & Plan:   Active Problems:   Depression   Acute anemia   Bedbug bite   Hypotension   Gout   AKI (acute kidney injury) ([place])   Cognitive impairment   1. Acute encephalopathy secondary to hypotension low hemoglobin.  Patient presented with a hemoglobin of 6.8.  She was noted to be hypotensive.  Patient was volume resuscitated and received PRBC transfusion.  Overall hemoglobin has improved.  Mental status appears to be approaching baseline. 2. Acute on chronic anemia.  Likely has element of acute blood loss superimposed on anemia of chronic kidney disease.  Hemoglobin was 6.8 on admission.  This improved to 9.9 with 1 unit PRBC transfusion.  She had an EGD approximately 2 years ago that showed mild Schatzki ring but no evidence of GI bleeding.  Unfortunately, hemoglobin decreased back down to 6.8 necessitating another transfusion of 1 unit prbc. Follow up hemoglobin 11. She will also receive a dose of IV iron. GI following for possible issues with blood loss. 3. Possible GI bleeding.  Reported to have dark red-colored stools.  Stool FOBT positive.  Continue on Protonix.  She was taking NSAIDs prior to admission. GI consulted, appreciate input. 4. AKI on CKD stage IIIb.  Baseline creatinine 1.4-1.6.  On admission, creatinine noted to be 2.1.  Likely related to hypotension as well as NSAID use.  This has improved to 1.2 with hydration 5. Hypertension.  Stable on hydralazine and metoprolol. Will hold further metoprolol since she was having episodes of bradycardia today (HR in 40s) 6. Depression.  Continue Zoloft 7. Bedbug infestation.  Noted by staff that she had  significant bedbugs, but no rash was seen.  Family was informed to clean linens and mattress at home.  No complaints of pruritus at this time.   DVT prophylaxis: SCD Code Status: full code. Code status was reviewed with patient's son and he is unsure if she would want full measures. I have suggested that with her advanced age and frailty, that DNR status would be appropriate. He wishes to discuss this further with other family members before making any changes to code status Family Communication: discussed with son over the phone Disposition Plan: discharge home once improved   Consultants:   GI  Procedures:     Antimicrobials:       Subjective: No abdominal pain or vomiting. Per staff, she had a brown colored stool earlier today. She did receive 1 unit prbc transfusion this morning when her AM labs showed low hemoglobin  Objective: Vitals:   04/24/19 0626 04/24/19 0910 04/24/19 1114 04/24/19 1639  BP: (!) 129/51 (!) 135/48 (!) 143/64 135/60  Pulse: (!) 52 (!) 45  66  Resp: 16 16  16   Temp: 97.6 F (36.4 C) 97.6 F (36.4 C)  99.2 F (37.3 C)  TempSrc: Oral Oral  Oral  SpO2: 97%   100%  Weight:      Height:        Intake/Output Summary (Last 24 hours) at 04/24/2019 1751 Last data filed at 04/24/2019 1730 Gross per 24 hour  Intake 2616.69 ml  Output --  Net 2616.69 ml  Filed Weights   04/21/19 2054  Weight: 54.4 kg    Examination:  General exam: Alert, awake, no distress Respiratory system: Clear to auscultation. Respiratory effort normal. Cardiovascular system:RRR. No murmurs, rubs, gallops. Gastrointestinal system: Abdomen is nondistended, soft and nontender. No organomegaly or masses felt. Normal bowel sounds heard. Central nervous system: Alert and oriented. No focal neurological deficits. Extremities: No C/C/E, +pedal pulses Skin: No rashes, lesions or ulcers Psychiatry: pleasant, confused   Data Reviewed: I have personally reviewed following labs  and imaging studies  CBC: Recent Labs  Lab 04/22/19 0301 04/22/19 0938 04/22/19 2229 04/23/19 1654 04/24/19 0317 04/24/19 1038  WBC 10.5 10.9*  --  9.2 7.4 10.9*  HGB 9.2* 9.9* 10.6* 8.0* 6.8* 11.0*  HCT 28.6* 28.5* 32.4* 23.1* 19.3* 31.9*  MCV 80.1 76.4*  --  76.0* 75.1* 77.2*  PLT 279 278  --  267 212 [id number]   Basic Metabolic Panel: Recent Labs  Lab 04/21/19 2102 04/22/19 0301 04/24/19 0317  NA 139 137 141  K 3.7 4.8 3.5  CL 110 108 113*  CO2 21* 20* 22  GLUCOSE 104* 105* 109*  BUN 34* 32* 23  CREATININE 2.12* 1.94* 1.23*  CALCIUM 7.8* 8.0* 7.7*   GFR: Estimated Creatinine Clearance: 23 mL/min (A) (by C-G formula based on SCr of 1.23 mg/dL (H)). Liver Function Tests: No results for input(s): AST, ALT, ALKPHOS, BILITOT, PROT, ALBUMIN in the last 168 hours. No results for input(s): LIPASE, AMYLASE in the last 168 hours. No results for input(s): AMMONIA in the last 168 hours. Coagulation Profile: No results for input(s): INR, PROTIME in the last 168 hours. Cardiac Enzymes: No results for input(s): CKTOTAL, CKMB, CKMBINDEX, TROPONINI in the last 168 hours. BNP (last 3 results) No results for input(s): PROBNP in the last 8760 hours. HbA1C: No results for input(s): HGBA1C in the last 72 hours. CBG: No results for input(s): GLUCAP in the last 168 hours. Lipid Profile: No results for input(s): CHOL, HDL, LDLCALC, TRIG, CHOLHDL, LDLDIRECT in the last 72 hours. Thyroid Function Tests: No results for input(s): TSH, T4TOTAL, FREET4, T3FREE, THYROIDAB in the last 72 hours. Anemia Panel: Recent Labs    04/21/19 2102 04/22/19 0301  VITAMINB12  --  1,095*  FOLATE 215.0  --   FERRITIN 26  --   TIBC 240*  --   IRON 30  --    Sepsis Labs: No results for input(s): PROCALCITON, LATICACIDVEN in the last 168 hours.  Recent Results (from the past 240 hour(s))  SARS CORONAVIRUS 2 (TAT 6-24 HRS) Nasopharyngeal Nasopharyngeal Swab     Status: None   Collection Time: 04/21/19  10:51 PM   Specimen: Nasopharyngeal Swab  Result Value Ref Range Status   SARS Coronavirus 2 NEGATIVE NEGATIVE Final    Comment: (NOTE) SARS-CoV-2 target nucleic acids are NOT DETECTED. The SARS-CoV-2 RNA is generally detectable in upper and lower respiratory specimens during the acute phase of infection. Negative results do not preclude SARS-CoV-2 infection, do not rule out co-infections with other pathogens, and should not be used as the sole basis for treatment or other patient management decisions. Negative results must be combined with clinical observations, patient history, and epidemiological information. The expected result is Negative. Fact Sheet for Patients: SugarRoll.be Fact Sheet for Healthcare Providers: https://www.woods-mathews.com/ This test is not yet approved or cleared by the [place] FDA and  has been authorized for detection and/or diagnosis of SARS-CoV-2 by FDA under an Emergency Use Authorization (EUA). This EUA will remain  in  effect (meaning this test can be used) for the duration of the COVID-19 declaration under Section 56 4(b)(1) of the Act, 21 U.S.C. section 360bbb-3(b)(1), unless the authorization is terminated or revoked sooner. Performed at [facility], [place] [address] [place] [address]          Radiology Studies: No results found.      Scheduled Meds: . allopurinol  300 mg Oral Daily  . docusate sodium  100 mg Oral Daily  . iron polysaccharides  150 mg Oral Daily   And  . folic acid  1 mg Oral Daily   And  . vitamin B-12  250 mcg Oral Daily  . hydrALAZINE  50 mg Oral BID  . HYDROcodone-acetaminophen  1 tablet Oral BID  . mouth rinse  15 mL Mouth Rinse BID  . pantoprazole  40 mg Oral BID AC  . sertraline  25 mg Oral Q breakfast  . tiZANidine  4 mg Oral QHS   Continuous Infusions: . sodium chloride 50 mL/hr at 04/24/19 1739  . ferumoxytol       LOS: 3 days     Time spent: 12mins    Kathie Dike, MD [facility]   If 7PM-7AM, please contact night-coverage [email or web address]  04/24/2019, 5:51 PM

## 2019-04-24 NOTE — Progress Notes (Signed)
Physical Therapy Treatment Patient Details Name: [name] MRN: [id number] DOB: [birth year] Today's Date: 04/24/2019    History of Present Illness [name] is a 85 y.o. female with medical history significant of hypertension, hx of chronic anemia requiring blood transfusion, GERD, CKD, and depression who presents with concerns of unresponsiveness.  Patient took her nighttime medicines tonight and was found to be difficult to arouse in her chair even though she usually goes straight to bed after her medication, family was unable to wake her.  They then called EMS and was told by the dispatcher to perform CPR.  The patient's daughter did 2 chest compressions but stopped because patient appeared to be breathing.  On EMS arrival, patient had regained consciousness and was found to be hypotensive with systolic in the [id number] with improvement after 500 cc of fluid bolus.    PT Comments    Pt ready for session,  Bed mobility without assist but increased time and rail.  Stood with min guard/supervision and was able to progress gait with RW 60' in room.  Slow but steady with no LOB or buckling.  Requested to return back to bed after gait.     Follow Up Recommendations  Home health PT     Equipment Recommendations  Rolling walker with 5" wheels    Recommendations for Other Services       Precautions / Restrictions Precautions Precautions: Fall Restrictions Weight Bearing Restrictions: No    Mobility  Bed Mobility Overal bed mobility: Modified Independent Bed Mobility: Supine to Sit;Sit to Supine     Supine to sit: Supervision Sit to supine: Supervision   General bed mobility comments: increased time but no assist.  Transfers Overall transfer level: Needs assistance Equipment used: Rolling walker (2 wheeled) Transfers: Sit to/from Stand Sit to Stand: Min guard;Supervision         General transfer comment: cues for sit to stand  Ambulation/Gait Ambulation/Gait assistance:  Min guard Gait Distance (Feet): 60 Feet Assistive device: Rolling walker (2 wheeled) Gait Pattern/deviations: Step-through pattern;Decreased step length - right;Decreased step length - left Gait velocity: decreased   General Gait Details: slow but steady gait with no LOB   Stairs             Wheelchair Mobility    Modified Rankin (Stroke Patients Only)       Balance Overall balance assessment: Modified Independent;Needs assistance Sitting-balance support: Feet supported Sitting balance-Leahy Scale: Good     Standing balance support: Bilateral upper extremity supported Standing balance-Leahy Scale: Fair Standing balance comment: generally steady but recommend +1 assist for safety                            Cognition Arousal/Alertness: Awake/alert Behavior During Therapy: WFL for tasks assessed/performed Overall Cognitive Status: Within Functional Limits for tasks assessed                                        Exercises Other Exercises Other Exercises: seated LAQ and marches x 10 without LOB    General Comments        Pertinent Vitals/Pain Pain Assessment: No/denies pain    Home Living                      Prior Function  PT Goals (current goals can now be found in the care plan section) Progress towards PT goals: Progressing toward goals    Frequency    Min 2X/week      PT Plan Current plan remains appropriate    Co-evaluation              AM-PAC PT "6 Clicks" Mobility   Outcome Measure  Help needed turning from your back to your side while in a flat bed without using bedrails?: None Help needed moving from lying on your back to sitting on the side of a flat bed without using bedrails?: None Help needed moving to and from a bed to a chair (including a wheelchair)?: A Little Help needed standing up from a chair using your arms (e.g., wheelchair or bedside chair)?: A Little Help needed  to walk in hospital room?: A Little Help needed climbing 3-5 steps with a railing? : A Little 6 Click Score: 20    End of Session Equipment Utilized During Treatment: Gait belt Activity Tolerance: Patient tolerated treatment well Patient left: in bed;with bed alarm set;with call bell/phone within reach Nurse Communication: Mobility status       Time: 1157-1208 PT Time Calculation (min) (ACUTE ONLY): 11 min  Charges:  $Gait Training: 8-22 mins                     Chesley Noon, PTA 04/24/19, 12:18 PM

## 2019-04-24 NOTE — Progress Notes (Signed)
Notified Dr. Roderic Palau patients heart rate was 45. Dr. Roderic Palau discontinued metoprolol.

## 2019-04-24 NOTE — Consult Note (Addendum)
[name] , MD [address], [place], [place], [place], [address] [name], [place], [place], [place], [address] Phone: [phone]  Fax: [phone]  Consultation  Referring Provider:    Dr Roderic Palau Primary Care Physician:  Jodi Marble, MD Primary Gastroenterologist: None  Reason for Consultation:     GI bleed  Date of Admission:  04/21/2019 Date of Consultation:  04/24/2019         HPI:   [name] is a 85 y.o. female admitted to the hospital on 04/21/2019 unresponsive , underwent CPR, was hypotensive when EMS arrived , found to have elevated troponins. Has been on NSAID's . Admitted with AKI, noted to have Bedbugs , cognitive impairment.   On admission HB 6.8 with MCV 79. Ferritin has been low 2 years back and low presently as well at 26. , TIBC not elevated. Normal B12.   Since admission been rehydrated and Creatinine has been trending down  . Initial rise in Hb with transfusion and since has been trending down as well . No overt blood loss noted.   Patient gives limited information , denies any pain or seeing any blood outside her body  EGD in 2018 showed shatzkis ring  Past Medical History:  Diagnosis Date  . Arthritis   . GERD (gastroesophageal reflux disease)   . Hypertension   . Neck fracture ([place]) 2015   C2    Past Surgical History:  Procedure Laterality Date  . ABDOMINAL HYSTERECTOMY    . CHOLECYSTECTOMY  08/06/14  . ESOPHAGOGASTRODUODENOSCOPY (EGD) WITH PROPOFOL N/A 08/03/2016   Procedure: ESOPHAGOGASTRODUODENOSCOPY (EGD) WITH PROPOFOL;  Surgeon: Ronald Lobo, MD;  Location: [facility];  Service: Gastroenterology;  Laterality: N/A;  . EYE SURGERY  2015   cataract    Prior to Admission medications   Medication Sig Start Date End Date Taking? Authorizing Provider  allopurinol (ZYLOPRIM) 300 MG tablet Take 300 mg by mouth daily.   Yes [provider]  COLCRYS 0.6 MG tablet Take 1 tablet by mouth daily as needed. 04/14/19  Yes  [provider]  diclofenac Sodium (VOLTAREN) 1 % GEL Apply 2 g topically 2 (two) times daily. 04/09/19  Yes [provider]  docusate sodium (COLACE) 100 MG capsule Docusate Sodium 100 MG Oral Capsule QTY: 0 capsule Days: 0 Refills: 0  Written: 06/01/15 Patient Instructions: 06/01/15  Yes [provider]  folic acid (FOLVITE) 1 MG tablet Folic Acid 1 MG Oral Tablet QTY: 90 each Days: 90 Refills: 0  Written: 04/19/18 Patient Instructions: TAKE ONE TABLET BY MOUTH EVERY DAY 04/19/18  Yes [provider]  hydrALAZINE (APRESOLINE) 50 MG tablet Take 50 mg by mouth 2 (two) times daily.    Yes [provider]  HYDROcodone-acetaminophen (NORCO/VICODIN) 5-325 MG tablet Take 1 tablet by mouth 2 (two) times daily.    Yes [provider]  meloxicam (MOBIC) 7.5 MG tablet Take 7.5 mg by mouth daily. 06/20/16  Yes [provider]  metoprolol tartrate (LOPRESSOR) 25 MG tablet Take 25 mg by mouth 2 (two) times daily.   Yes [provider]  pantoprazole (PROTONIX) 40 MG tablet Pantoprazole Sodium 40 MG Oral Tablet Delayed Release QTY: 30 each Days: 30 Refills: 2  Written: 07/05/18 Patient Instructions: TAKE 1 TABLET BY MOUTH DAILY 07/05/18  Yes [provider]  POLY-IRON 150 FORTE 150-25-1 MG-MCG-MG CAPS Take 1 capsule by mouth every morning. 03/28/19  Yes [provider]  sertraline (ZOLOFT) 25 MG tablet Take 25 mg by  mouth daily with breakfast.    Yes [provider]  tiZANidine (ZANAFLEX) 2 MG tablet Take 4 mg by mouth at bedtime.   Yes [provider]    Family History  Problem Relation Age of Onset  . Rheumatologic disease Mother      Social History   Tobacco Use  . Smoking status: Never Smoker  . Smokeless tobacco: Never Used  Substance Use Topics  . Alcohol use: No    Alcohol/week: 0.0 standard drinks  . Drug use: No    Allergies as of 04/21/2019  . (No Known Allergies)    Review of Systems:     All systems reviewed and negative except where noted in HPI.   Physical Exam:  Vital signs in last 24 hours: Temp:  [97.4 F (36.3 C)-99.8 F (37.7 C)] 97.6 F (36.4 C) (12/10 0910) Pulse Rate:  [45-70] 45 (12/10 0910) Resp:  [16-20] 16 (12/10 0910) BP: (105-158)/(48-64) 143/64 (12/10 1114) SpO2:  [97 %-100 %] 97 % (12/10 0626) Last BM Date: 04/23/19 General:   Pleasant, cooperative in NAD Head:  Normocephalic and atraumatic. Eyes:   No icterus.   Conjunctiva pink. PERRLA. Ears:  Normal auditory acuity. Neck:  Supple; no masses or thyroidomegaly Lungs: Respirations even and unlabored. Lungs clear to auscultation bilaterally.   No wheezes, crackles, or rhonchi.  Heart:  Regular rate and rhythm;  Without murmur, clicks, rubs or gallops Abdomen:  Soft, nondistended, nontender. Normal bowel sounds. No appreciable masses or hepatomegaly.  No rebound or guarding.  Neurologic:  Alert and oriented x1-2;  grossly normal neurologically. Skin:  Intact without significant lesions or rashes. Cervical Nodes:  No significant cervical adenopathy. Psych:  Alert and cooperative. Normal affect.  LAB RESULTS: Recent Labs    04/23/19 1654 04/24/19 0317 04/24/19 1038  WBC 9.2 7.4 10.9*  HGB 8.0* 6.8* 11.0*  HCT 23.1* 19.3* 31.9*  PLT 267 212 242   BMET Recent Labs    04/21/19 2102 04/22/19 0301 04/24/19 0317  NA 139 137 141  K 3.7 4.8 3.5  CL 110 108 113*  CO2 21* 20* 22  GLUCOSE 104* 105* 109*  BUN 34* 32* 23  CREATININE 2.12* 1.94* 1.23*  CALCIUM 7.8* 8.0* 7.7*   LFT No results for input(s): PROT, ALBUMIN, AST, ALT, ALKPHOS, BILITOT, BILIDIR, IBILI in the last 72 hours. PT/INR No results for input(s): LABPROT, INR in the last 72 hours.  STUDIES: No results found.    Impression / Plan:   [name] is a 85 y.o. y/o female with a history of cognitive impairment , CKD, iron deficiency at baseline admitted with unresponsiveness, AKI and found to have a microcytic anemia  with borderline low iron. I have been called for a drop in Hb since transfusion.   Impression: Her Hb has trended down with rehydration and normalization of her intravascular volume. Her creatinine has improved by > 50%. Likely effects of dilution. Spoke to [employer] nurse last bowel movement was brown and with 1 unit PRBC today has gone from 6.8 gms--->1 unit ----> 11 grams   She does probably have a combination of iron deficiency anemia and an anemia of chronic disease.   With her frailty, poor cognition and age , risks of anesthesia would be high. If stools are brown in color and not black or red then will watch and wait. IF has signs of overt bleeding then we will have no option but to perform an EGD. Her stool test is positive which  is meant for colon cancer screening and does not rule in or rule out a GI bleed. No elevation in BUN/Cr ratio also suggests less likely an acute bleed.   Suggest IV iron,PPI .I will see her back again tomorrow.    Thank you for involving me in the care of this patient.      LOS: 3 days   Natalie Bellows, MD  04/24/2019, 12:41 PM

## 2019-04-25 LAB — BPAM RBC
Blood Product Expiration Date: 202101082359
Blood Product Expiration Date: 202101112359
ISSUE DATE / TIME: 202012080053
ISSUE DATE / TIME: 202012100603
Unit Type and Rh: 7300
Unit Type and Rh: 7300

## 2019-04-25 LAB — CBC
HCT: 27.3 % — ABNORMAL LOW (ref 36.0–46.0)
Hemoglobin: 9.1 g/dL — ABNORMAL LOW (ref 12.0–15.0)
MCH: 26.5 pg (ref 26.0–34.0)
MCHC: 33.3 g/dL (ref 30.0–36.0)
MCV: 79.4 fL — ABNORMAL LOW (ref 80.0–100.0)
Platelets: 219 10*3/uL (ref 150–400)
RBC: 3.44 MIL/uL — ABNORMAL LOW (ref 3.87–5.11)
RDW: 16 % — ABNORMAL HIGH (ref 11.5–15.5)
WBC: 9.8 10*3/uL (ref 4.0–10.5)
nRBC: 0 % (ref 0.0–0.2)

## 2019-04-25 LAB — TYPE AND SCREEN
ABO/RH(D): B POS
Antibody Screen: NEGATIVE
Unit division: 0
Unit division: 0

## 2019-04-25 NOTE — Progress Notes (Signed)
PROGRESS NOTE    [name]  [id number] DOB: 06-07-1920 DOA: 04/21/2019 PCP: Jodi Marble, MD    Brief Narrative:  85 y/o female with history of HTN, chronic anemia, CKD 3b, GERD, who presented to the hospital when she became unresponsive. She was found to be hypotensive and had acute on chronic anemia.    Assessment & Plan:   Active Problems:   Depression   Acute anemia   Bedbug bite   Hypotension   Gout   AKI (acute kidney injury) ([place])   Cognitive impairment   1. Acute encephalopathy secondary to hypotension low hemoglobin.  Patient presented with a hemoglobin of 6.8.  She was noted to be hypotensive.  Patient was volume resuscitated and received PRBC transfusion.  Overall hemoglobin has improved.  Mental status appears to be approaching baseline. 2. Acute on chronic anemia.  Likely has element of acute blood loss superimposed on anemia of chronic kidney disease.  Hemoglobin was 6.8 on admission.  This improved to 9.9 with 1 unit PRBC transfusion.  She had an EGD approximately 2 years ago that showed mild Schatzki ring but no evidence of GI bleeding.  Unfortunately, hemoglobin decreased back down to 6.8 necessitating another transfusion of 1 unit prbc. Follow up hemoglobin 11 and this morning noted to be 9.5.  Continue to monitor for now she also received a dose of IV iron. GI following for possible issues with blood loss. 3. Possible GI bleeding.  Reported to have dark red-colored stools.  Stool FOBT positive.  Continue on Protonix.  She was taking NSAIDs prior to admission. GI consulted, appreciate input.  Continue observation for now. 4. AKI on CKD stage IIIb.  Baseline creatinine 1.4-1.6.  On admission, creatinine noted to be 2.1.  Likely related to hypotension as well as NSAID use.  This has improved to 1.2 with hydration 5. Hypertension.  Stable on hydralazine and metoprolol. Will hold further metoprolol since she was having episodes of bradycardia today (HR in  40s) 6. Depression.  Continue Zoloft 7. Bedbug infestation.  Noted by staff that she had significant bedbugs, but no rash was seen.  Family was informed to clean linens and mattress at home.  No complaints of pruritus at this time.   DVT prophylaxis: SCD Code Status: full code. Code status was reviewed with patient's son and he is unsure if she would want full measures. I have suggested that with her advanced age and frailty, that DNR status would be appropriate. He wishes to discuss this further with other family members before making any changes to code status Family Communication: Left voicemail for daughter-in-law who is legal guardian Disposition Plan: discharge home once improved   Consultants:   GI  Procedures:     Antimicrobials:       Subjective: Denies any abdominal pain.  No vomiting.  Had a bowel movement earlier today.  Objective: Vitals:   04/24/19 1114 04/24/19 1639 04/24/19 2315 04/25/19 1234  BP: (!) 143/64 135/60 (!) 156/70 (!) 182/65  Pulse:  66 83 72  Resp:  16 18 18   Temp:  99.2 F (37.3 C) 99.7 F (37.6 C) 98.9 F (37.2 C)  TempSrc:  Oral Oral Oral  SpO2:  100% 97% 100%  Weight:      Height:        Intake/Output Summary (Last 24 hours) at 04/25/2019 2000 Last data filed at 04/25/2019 1826 Gross per 24 hour  Intake 240 ml  Output 300 ml  Net -60 ml  Filed Weights   04/21/19 2054  Weight: 54.4 kg    Examination: General exam: Alert, awake, no distress Respiratory system: Clear to auscultation. Respiratory effort normal. Cardiovascular system:RRR. No murmurs, rubs, gallops. Gastrointestinal system: Abdomen is nondistended, soft and nontender. No organomegaly or masses felt. Normal bowel sounds heard. Central nervous system: Alert and oriented. No focal neurological deficits. Extremities: No C/C/E, +pedal pulses Skin: No rashes, lesions or ulcers Psychiatry: Pleasant, confused.     Data Reviewed: I have personally reviewed  following labs and imaging studies  CBC: Recent Labs  Lab 04/22/19 0938 04/22/19 2229 04/23/19 1654 04/24/19 0317 04/24/19 1038 04/25/19 0549  WBC 10.9*  --  9.2 7.4 10.9* 9.8  HGB 9.9* 10.6* 8.0* 6.8* 11.0* 9.1*  HCT 28.5* 32.4* 23.1* 19.3* 31.9* 27.3*  MCV 76.4*  --  76.0* 75.1* 77.2* 79.4*  PLT 278  --  267 212 242 [id number]   Basic Metabolic Panel: Recent Labs  Lab 04/21/19 2102 04/22/19 0301 04/24/19 0317  NA 139 137 141  K 3.7 4.8 3.5  CL 110 108 113*  CO2 21* 20* 22  GLUCOSE 104* 105* 109*  BUN 34* 32* 23  CREATININE 2.12* 1.94* 1.23*  CALCIUM 7.8* 8.0* 7.7*   GFR: Estimated Creatinine Clearance: 23 mL/min (A) (by C-G formula based on SCr of 1.23 mg/dL (H)). Liver Function Tests: No results for input(s): AST, ALT, ALKPHOS, BILITOT, PROT, ALBUMIN in the last 168 hours. No results for input(s): LIPASE, AMYLASE in the last 168 hours. No results for input(s): AMMONIA in the last 168 hours. Coagulation Profile: No results for input(s): INR, PROTIME in the last 168 hours. Cardiac Enzymes: No results for input(s): CKTOTAL, CKMB, CKMBINDEX, TROPONINI in the last 168 hours. BNP (last 3 results) No results for input(s): PROBNP in the last 8760 hours. HbA1C: No results for input(s): HGBA1C in the last 72 hours. CBG: No results for input(s): GLUCAP in the last 168 hours. Lipid Profile: No results for input(s): CHOL, HDL, LDLCALC, TRIG, CHOLHDL, LDLDIRECT in the last 72 hours. Thyroid Function Tests: No results for input(s): TSH, T4TOTAL, FREET4, T3FREE, THYROIDAB in the last 72 hours. Anemia Panel: No results for input(s): VITAMINB12, FOLATE, FERRITIN, TIBC, IRON, RETICCTPCT in the last 72 hours. Sepsis Labs: No results for input(s): PROCALCITON, LATICACIDVEN in the last 168 hours.  Recent Results (from the past 240 hour(s))  SARS CORONAVIRUS 2 (TAT 6-24 HRS) Nasopharyngeal Nasopharyngeal Swab     Status: None   Collection Time: 04/21/19 10:51 PM   Specimen:  Nasopharyngeal Swab  Result Value Ref Range Status   SARS Coronavirus 2 NEGATIVE NEGATIVE Final    Comment: (NOTE) SARS-CoV-2 target nucleic acids are NOT DETECTED. The SARS-CoV-2 RNA is generally detectable in upper and lower respiratory specimens during the acute phase of infection. Negative results do not preclude SARS-CoV-2 infection, do not rule out co-infections with other pathogens, and should not be used as the sole basis for treatment or other patient management decisions. Negative results must be combined with clinical observations, patient history, and epidemiological information. The expected result is Negative. Fact Sheet for Patients: SugarRoll.be Fact Sheet for Healthcare Providers: https://www.woods-mathews.com/ This test is not yet approved or cleared by the [place] FDA and  has been authorized for detection and/or diagnosis of SARS-CoV-2 by FDA under an Emergency Use Authorization (EUA). This EUA will remain  in effect (meaning this test can be used) for the duration of the COVID-19 declaration under Section 56 4(b)(1) of the Act, 21 U.S.C. section 360bbb-3(b)(1),  unless the authorization is terminated or revoked sooner. Performed at [facility], [place] [address], [place], [place] [address]          Radiology Studies: No results found.      Scheduled Meds: . allopurinol  300 mg Oral Daily  . docusate sodium  100 mg Oral Daily  . iron polysaccharides  150 mg Oral Daily   And  . folic acid  1 mg Oral Daily   And  . vitamin B-12  250 mcg Oral Daily  . hydrALAZINE  50 mg Oral BID  . HYDROcodone-acetaminophen  1 tablet Oral BID  . mouth rinse  15 mL Mouth Rinse BID  . pantoprazole  40 mg Oral BID AC  . sertraline  25 mg Oral Q breakfast  . tiZANidine  4 mg Oral QHS   Continuous Infusions: . sodium chloride 50 mL/hr at 04/24/19 1739     LOS: 4 days    Time spent: 69mins    Kathie Dike,  MD [facility]   If 7PM-7AM, please contact night-coverage [email or web address]  04/25/2019, 8:00 PM

## 2019-04-25 NOTE — Progress Notes (Signed)
[name] , MD [address], [place], [place], [place], [address] [address], [place], [place], [place], [address] Phone: [phone]  Fax: [phone]   DENAISHA EIDEN is being followed for Anemia  Day 1 of follow up   Subjective: No complaints   Objective: Vital signs in last 24 hours: Vitals:   04/24/19 1114 04/24/19 1639 04/24/19 2315 04/25/19 1234  BP: (!) 143/64 135/60 (!) 156/70 (!) 182/65  Pulse:  66 83 72  Resp:  16 18 18   Temp:  99.2 F (37.3 C) 99.7 F (37.6 C) 98.9 F (37.2 C)  TempSrc:  Oral Oral Oral  SpO2:  100% 97% 100%  Weight:      Height:       Weight change:   Intake/Output Summary (Last 24 hours) at 04/25/2019 1304 Last data filed at 04/24/2019 1730 Gross per 24 hour  Intake 2031.69 ml  Output --  Net 2031.69 ml     Exam: Heart:: Regular rate and rhythm, S1S2 present or without murmur or extra heart sounds Lungs: normal, clear to auscultation and clear to auscultation and percussion Abdomen: soft, nontender, normal bowel sounds   Lab Results: @LABTEST2 @ Micro Results: Recent Results (from the past 240 hour(s))  SARS CORONAVIRUS 2 (TAT 6-24 HRS) Nasopharyngeal Nasopharyngeal Swab     Status: None   Collection Time: 04/21/19 10:51 PM   Specimen: Nasopharyngeal Swab  Result Value Ref Range Status   SARS Coronavirus 2 NEGATIVE NEGATIVE Final    Comment: (NOTE) SARS-CoV-2 target nucleic acids are NOT DETECTED. The SARS-CoV-2 RNA is generally detectable in upper and lower respiratory specimens during the acute phase of infection. Negative results do not preclude SARS-CoV-2 infection, do not rule out co-infections with other pathogens, and should not be used as the sole basis for treatment or other patient management decisions. Negative results must be combined with clinical observations, patient history, and epidemiological information. The expected result is Negative. Fact Sheet for  Patients: SugarRoll.be Fact Sheet for Healthcare Providers: https://www.woods-mathews.com/ This test is not yet approved or cleared by the [place] FDA and  has been authorized for detection and/or diagnosis of SARS-CoV-2 by FDA under an Emergency Use Authorization (EUA). This EUA will remain  in effect (meaning this test can be used) for the duration of the COVID-19 declaration under Section 56 4(b)(1) of the Act, 21 U.S.C. section 360bbb-3(b)(1), unless the authorization is terminated or revoked sooner. Performed at [facility], [place] [address] [place], [place] [address]    Studies/Results: No results found. Medications: I have reviewed the patient's current medications. Scheduled Meds: . allopurinol  300 mg Oral Daily  . docusate sodium  100 mg Oral Daily  . iron polysaccharides  150 mg Oral Daily   And  . folic acid  1 mg Oral Daily   And  . vitamin B-12  250 mcg Oral Daily  . hydrALAZINE  50 mg Oral BID  . HYDROcodone-acetaminophen  1 tablet Oral BID  . mouth rinse  15 mL Mouth Rinse BID  . pantoprazole  40 mg Oral BID AC  . sertraline  25 mg Oral Q breakfast  . tiZANidine  4 mg Oral QHS   Continuous Infusions: . sodium chloride 50 mL/hr at 04/24/19 1739   PRN Meds:.hydrALAZINE   Assessment: Active Problems:   Depression   Acute anemia   Bedbug bite   Hypotension   Gout   AKI (acute kidney injury) ([place])   Cognitive impairment  Saundra Vicini  Mceuen is a 85 y.o. y/o female with a history of cognitive impairment , CKD, iron deficiency at baseline admitted with unresponsiveness, AKI and found to have a microcytic anemia with borderline low iron. I have been called for a drop in Hb since transfusion.   Impression: Her Hb has trended down with rehydration and normalization of her intravascular volume. Her creatinine has improved by > 50%. Likely effects of dilution. Spoke to [employer] nurse last bowel movement was brown  and with 1 unit PRBC today has gone from 6.8 gms--->1 unit ----> 11 grams ---->9.1 grams   She does probably have a combination of iron deficiency anemia and an anemia of chronic disease.   With her frailty, poor cognition and age , risks of anesthesia would be high. If stools are brown in color and not black or red then will watch and wait. IF has signs of overt bleeding then we will have no option but to perform an EGD. Her stool test is positive which is meant for colon cancer screening and does not rule in or rule out a GI bleed. No elevation in BUN/Cr ratio also suggests less likely an acute bleed. No bowel movements today so far.      LOS: 4 days   [name], MD 04/25/2019, 1:04 PM

## 2019-04-25 NOTE — Care Management Important Message (Signed)
Important Message  Patient Details  Name: [name] MRN: [id number] Date of Birth: 03-18-1921   Medicare Important Message Given:  Yes  Reviewed verbally with Prince Solian, son, at [phone]. Copy mailed to son's attention at patient's home address.  Confirmed address.     Dannette Barbara 04/25/2019, 1:14 PM

## 2019-04-25 NOTE — Progress Notes (Signed)
Speech Language Pathology Treatment: Dysphagia  Patient Details Name: [name] MRN: [id number] DOB: 1921/01/10 Today's Date: 04/25/2019 Time: 0840-0920 SLP Time Calculation (min) (ACUTE ONLY): 40 min  Assessment / Plan / Recommendation Clinical Impression  Pt seen today for ongoing assessment of toleration of recommended dysphagia diet (modified food consistencies) and thin liquids; also trials to upgrade food consistency to Minced foods if able. Pt was resting but awakened and helped to sit up in bed w/ full support/cues by SLP. Pt was talkative but suspect a degree of Cognitive decline.  Pt continues to present w/ Oral phase dysphagia impacted by declined Cognitive awareness/status and Edentulous status - pt was still confused about her Dentition status stating "I have my own teeth" to SLP but in fact she is Edentulous . Pt appeared to adequately tolerate trials of thin liquids and purees w/ no overt, clinical s/s of aspiration and grossly timely oral phase bolus management/clearing -- though food/liquids were alternated during feeding in order to aid oral clearing. When trials of increased texture were attempted(Minced), pt's oral phase was c/b intermittent decreased attention to bolus awareness, oral holding, prolonged A-P transfer. Pt required verbal cues and encouragement to use lingual sweeps and sips of liquids to clear food held orally. Pt exhibited more of a mashing pattern moving food to anterior portion of mouth where it remained as residue on her tongue when oral cavity was checked -- pieces of broken down solids remain orally. She exhibited min Confusion/awareness during the oral prep and oral phase of po trials; suspect impacted by Cognitive decline. She consumed thin liquids via Cup/Straw holding Cup w/ support from SLP.   Recommend continue a Dysphagia level 1 (puree foods) w/ Thin liquids w/ monitoring and support d/t apparent Cognitive decline/behavior; general aspiration  precautions; upright position; reduced environmental distractions and alternate bites w/ sips of liquid for oral clearing. Pills Crushed in Puree for safer, easier swallowing. NSG to check for oral clearing during/post meals. Recommend Dietician consult for nutritional supplements -- drink forms. NSG consulted and agreed.   This diet consistency will be the most beneficial for pt to meet nutritional needs safely. No further skilled ST services indicated at this time. Handouts left in room on diet consistency and general aspiration precautions. Recommend support at all meals.        HPI HPI: Pt is a 85 y.o. female with medical history significant of GERD, hypertension, hx of chronic anemia requiring blood transfusion, CKD, and depression who presents with concerns of unresponsiveness.  History is obtained from her granddaughter, Candace by phone given that patient does not quite recall the event and is only oriented to self.  She reports that patient took her nighttime medicines tonight and was found to be difficult to arouse in her chair. On EMS arrival, patient had regained consciousness and was found to be hypotensive with systolic in the 80E with improvement after 500 cc of fluid bolus. Patient has had decreased p.o. intake for the past 2 days per family. CXR: "No edema or consolidation". Unsure of pt's baseline Cognitive status.       SLP Plan  All goals met       Recommendations  Diet recommendations: Dysphagia 1 (puree);Thin liquid(w/ gravies to moisten) Liquids provided via: Cup;Straw Medication Administration: Whole meds with puree(vs need to Crush in Puree for swallowing/clearing) Supervision: Patient able to self feed;Staff to assist with self feeding;Full supervision/cueing for compensatory strategies(pt can hold cup to drink) Compensations: Minimize environmental distractions;Slow rate;Small sips/bites;Lingual sweep for  clearance of pocketing;Multiple dry swallows after each  bite/sip;Follow solids with liquid Postural Changes and/or Swallow Maneuvers: Seated upright 90 degrees;Upright 30-60 min after meal                General recommendations: (Dietician f/u) Oral Care Recommendations: Oral care BID;Oral care before and after PO;Staff/trained caregiver to provide oral care Follow up Recommendations: None SLP Visit Diagnosis: Dysphagia, oral phase (R13.11)(suspected Cognitive decline) Plan: All goals met       GO                Natalie Kenner, MS, CCC-SLP [name] 04/25/2019, 10:31 AM

## 2019-04-26 LAB — CBC
HCT: 27.8 % — ABNORMAL LOW (ref 36.0–46.0)
Hemoglobin: 9.7 g/dL — ABNORMAL LOW (ref 12.0–15.0)
MCH: 26.7 pg (ref 26.0–34.0)
MCHC: 34.9 g/dL (ref 30.0–36.0)
MCV: 76.6 fL — ABNORMAL LOW (ref 80.0–100.0)
Platelets: 259 10*3/uL (ref 150–400)
RBC: 3.63 MIL/uL — ABNORMAL LOW (ref 3.87–5.11)
RDW: 16.4 % — ABNORMAL HIGH (ref 11.5–15.5)
WBC: 8.8 10*3/uL (ref 4.0–10.5)
nRBC: 0 % (ref 0.0–0.2)

## 2019-04-26 MED ORDER — AMLODIPINE BESYLATE 5 MG PO TABS: 5.0000 mg | ORAL_TABLET | Freq: Every day | ORAL | 11 refills | Status: DC

## 2019-04-26 NOTE — Discharge Summary (Signed)
Physician Discharge Summary  [name] [id number] DOB: [birth year] DOA: 04/21/2019  PCP: Jodi Marble, MD  Admit date: 04/21/2019 Discharge date: 04/26/2019  Admitted From: Home Disposition: Home  Recommendations for Outpatient Follow-up:  1. Follow up with PCP in 1-2 weeks 2. Please obtain BMP/CBC in one week 3. I have encouraged further conversations around [place] to be conducted by primary care physician with family.  Discharge Condition: Stable CODE STATUS: Full code Diet recommendation: Heart healthy  Brief/Interim Summary: 85 y/o female with history of HTN, chronic anemia, CKD 3b, GERD, who presented to the hospital when she became unresponsive. She was found to be hypotensive and had acute on chronic anemia.   Discharge Diagnoses:  Active Problems:   Depression   Acute anemia   Bedbug bite   Hypotension   Gout   AKI (acute kidney injury) ([place])   Cognitive impairment  1. Acute encephalopathy secondary to hypotension from low hemoglobin.  Patient presented with a hemoglobin of 6.8.  She was noted to be hypotensive.  Patient was volume resuscitated and received PRBC transfusion.  Overall hemoglobin has improved.  Mental status appears to be approaching baseline. 2. Acute on chronic anemia.  Likely has element of acute blood loss superimposed on anemia of chronic kidney disease.  Hemoglobin was 6.8 on admission.  This improved to 9.9 with 1 unit PRBC transfusion.  She had an EGD approximately 2 years ago that showed mild Schatzki ring but no evidence of GI bleeding.  Unfortunately, hemoglobin decreased back down to 6.8 necessitating another transfusion of 1 unit prbc. Follow up hemoglobin 11 and the following morning noted to be 9.5.  Her hemoglobin has since appeared to stabilize around this range.  She also received a dose of intravenous iron. 3. GI bleeding.  Reported to have dark red-colored stools.  Stool FOBT positive.    She did have low back on her home  medication list which was discontinued.  Her family has been advised to not give the patient any NSAIDs and to continue on PPI.  She is not having brown stools does not appear to have any ongoing GI bleeding.  She was evaluated by GI due to her advanced age and high risk for any procedures, it was not felt that any endoscopic evaluation would be beneficial at this time due to the significant risks. 4. AKI on CKD stage IIIb.  Baseline creatinine 1.4-1.6.  On admission, creatinine noted to be 2.1.  Likely related to hypotension as well as NSAID use.  This has improved to 1.2 with hydration 5. Hypertension.    Patient was having heart rate in the 40s and metoprolol was discontinued.  She is continued on hydralazine and also been started on Norvasc. 6. Depression.  Continue Zoloft 7. Goals of care.  Several conversations with multiple family members regarding goals of care since patient is a full code.  At this time, after discussing amongst himself they wish to continue with full CODE STATUS.  I am encouraging them to discuss this further with her primary care physician and that likely a DNR status would be more appropriate.  Discharge Instructions  Discharge Instructions    Diet - low sodium heart healthy   Complete by: As directed    Increase activity slowly   Complete by: As directed      Allergies as of 04/26/2019   No Known Allergies     Medication List    STOP taking these medications   meloxicam 7.5 MG  tablet Commonly known as: MOBIC   metoprolol tartrate 25 MG tablet Commonly known as: LOPRESSOR     TAKE these medications   allopurinol 300 MG tablet Commonly known as: ZYLOPRIM Take 300 mg by mouth daily. Notes to patient: Morning 04/27/19   amLODipine 5 MG tablet Commonly known as: NORVASC Take 1 tablet (5 mg total) by mouth daily.   Colcrys 0.6 MG tablet Generic drug: colchicine Take 1 tablet by mouth daily as needed. Notes to patient: As needed   diclofenac Sodium 1  % Gel Commonly known as: VOLTAREN Apply 2 g topically 2 (two) times daily. Notes to patient: Before bed 04/26/19   docusate sodium 100 MG capsule Commonly known as: COLACE Docusate Sodium 100 MG Oral Capsule QTY: 0 capsule Days: 0 Refills: 0  Written: 06/01/15 Patient Instructions: Notes to patient: Morning [id number]   folic acid 1 MG tablet Commonly known as: FOLVITE Folic Acid 1 MG Oral Tablet QTY: 90 each Days: 90 Refills: 0  Written: 04/19/18 Patient Instructions: TAKE ONE TABLET BY MOUTH EVERY DAY Notes to patient: Morning 04/27/19   hydrALAZINE 50 MG tablet Commonly known as: APRESOLINE Take 50 mg by mouth 2 (two) times daily. Notes to patient: Before bed 04/26/19   HYDROcodone-acetaminophen 5-325 MG tablet Commonly known as: NORCO/VICODIN Take 1 tablet by mouth 2 (two) times daily. Notes to patient: Before bed 04/26/19   pantoprazole 40 MG tablet Commonly known as: PROTONIX Pantoprazole Sodium 40 MG Oral Tablet Delayed Release QTY: 30 each Days: 30 Refills: 2  Written: 07/05/18 Patient Instructions: TAKE 1 TABLET BY MOUTH DAILY Notes to patient: Morning 04/27/19   Poly-Iron 150 Forte 150-0.025-1 MG Caps Generic drug: Iron Polysacch [id number] Take 1 capsule by mouth every morning. Notes to patient: Morning 04/27/19   sertraline 25 MG tablet Commonly known as: ZOLOFT Take 25 mg by mouth daily with breakfast.   tiZANidine 2 MG tablet Commonly known as: ZANAFLEX Take 4 mg by mouth at bedtime. Notes to patient: Before bed 04/26/19      Follow-up Information    Jodi Marble, MD. Schedule an appointment as soon as possible for a visit in 2 week(s).   Specialty: Internal Medicine Contact information: [address] [place] [address] [phone]          No Known Allergies  Consultations:  Gastroenterology   Procedures/Studies:  No results found.    Subjective: No further bloody stools, feeling well.  Discharge Exam: Vitals:    04/25/19 2155 04/26/19 0513 04/26/19 0838 04/26/19 1322  BP: (!) 186/77 (!) 169/73 (!) 184/79 (!) 175/66  Pulse: 80 63 76 85  Resp: 20 20  18   Temp: 99 F (37.2 C) 98.5 F (36.9 C)  98.2 F (36.8 C)  TempSrc: Oral Oral    SpO2: 99% 100% 100% 100%  Weight:      Height:        General: Pt is alert, awake, not in acute distress Cardiovascular: RRR, S1/S2 +, no rubs, no gallops Respiratory: CTA bilaterally, no wheezing, no rhonchi Abdominal: Soft, NT, ND, bowel sounds + Extremities: no edema, no cyanosis    The results of significant diagnostics from this hospitalization (including imaging, microbiology, ancillary and laboratory) are listed below for reference.     Microbiology: Recent Results (from the past 240 hour(s))  SARS CORONAVIRUS 2 (TAT 6-24 HRS) Nasopharyngeal Nasopharyngeal Swab     Status: None   Collection Time: 04/21/19 10:51 PM   Specimen: Nasopharyngeal Swab  Result Value Ref Range Status  SARS Coronavirus 2 NEGATIVE NEGATIVE Final    Comment: (NOTE) SARS-CoV-2 target nucleic acids are NOT DETECTED. The SARS-CoV-2 RNA is generally detectable in upper and lower respiratory specimens during the acute phase of infection. Negative results do not preclude SARS-CoV-2 infection, do not rule out co-infections with other pathogens, and should not be used as the sole basis for treatment or other patient management decisions. Negative results must be combined with clinical observations, patient history, and epidemiological information. The expected result is Negative. Fact Sheet for Patients: SugarRoll.be Fact Sheet for Healthcare Providers: https://www.woods-mathews.com/ This test is not yet approved or cleared by the [place] FDA and  has been authorized for detection and/or diagnosis of SARS-CoV-2 by FDA under an Emergency Use Authorization (EUA). This EUA will remain  in effect (meaning this test can be used) for the  duration of the COVID-19 declaration under Section 56 4(b)(1) of the Act, 21 U.S.C. section 360bbb-3(b)(1), unless the authorization is terminated or revoked sooner. Performed at [facility], [place] [address] [place], [place] [address]      Labs: BNP (last 3 results) No results for input(s): BNP in the last 8760 hours. Basic Metabolic Panel: Recent Labs  Lab 04/21/19 2102 04/22/19 0301 04/24/19 0317  NA 139 137 141  K 3.7 4.8 3.5  CL 110 108 113*  CO2 21* 20* 22  GLUCOSE 104* 105* 109*  BUN 34* 32* 23  CREATININE 2.12* 1.94* 1.23*  CALCIUM 7.8* 8.0* 7.7*   Liver Function Tests: No results for input(s): AST, ALT, ALKPHOS, BILITOT, PROT, ALBUMIN in the last 168 hours. No results for input(s): LIPASE, AMYLASE in the last 168 hours. No results for input(s): AMMONIA in the last 168 hours. CBC: Recent Labs  Lab 04/23/19 1654 04/24/19 0317 04/24/19 1038 04/25/19 0549 04/26/19 0528  WBC 9.2 7.4 10.9* 9.8 8.8  HGB 8.0* 6.8* 11.0* 9.1* 9.7*  HCT 23.1* 19.3* 31.9* 27.3* 27.8*  MCV 76.0* 75.1* 77.2* 79.4* 76.6*  PLT 267 212 242 219 259   Cardiac Enzymes: No results for input(s): CKTOTAL, CKMB, CKMBINDEX, TROPONINI in the last 168 hours. BNP: Invalid input(s): POCBNP CBG: No results for input(s): GLUCAP in the last 168 hours. D-Dimer No results for input(s): DDIMER in the last 72 hours. Hgb A1c No results for input(s): HGBA1C in the last 72 hours. Lipid Profile No results for input(s): CHOL, HDL, LDLCALC, TRIG, CHOLHDL, LDLDIRECT in the last 72 hours. Thyroid function studies No results for input(s): TSH, T4TOTAL, T3FREE, THYROIDAB in the last 72 hours.  Invalid input(s): FREET3 Anemia work up No results for input(s): VITAMINB12, FOLATE, FERRITIN, TIBC, IRON, RETICCTPCT in the last 72 hours. Urinalysis    Component Value Date/Time   COLORURINE YELLOW (A) 04/21/2019 2247   APPEARANCEUR HAZY (A) 04/21/2019 2247   LABSPEC 1.019 04/21/2019 2247   PHURINE 5.0  04/21/2019 [place] 04/21/2019 2247   HGBUR NEGATIVE 04/21/2019 2247   BILIRUBINUR NEGATIVE 04/21/2019 2247   KETONESUR 5 (A) 04/21/2019 2247   PROTEINUR NEGATIVE 04/21/2019 2247   NITRITE NEGATIVE 04/21/2019 2247   LEUKOCYTESUR NEGATIVE 04/21/2019 2247   Sepsis Labs Invalid input(s): PROCALCITONIN,  WBC,  LACTICIDVEN Microbiology Recent Results (from the past 240 hour(s))  SARS CORONAVIRUS 2 (TAT 6-24 HRS) Nasopharyngeal Nasopharyngeal Swab     Status: None   Collection Time: 04/21/19 10:51 PM   Specimen: Nasopharyngeal Swab  Result Value Ref Range Status   SARS Coronavirus 2 NEGATIVE NEGATIVE Final    Comment: (NOTE) SARS-CoV-2 target  nucleic acids are NOT DETECTED. The SARS-CoV-2 RNA is generally detectable in upper and lower respiratory specimens during the acute phase of infection. Negative results do not preclude SARS-CoV-2 infection, do not rule out co-infections with other pathogens, and should not be used as the sole basis for treatment or other patient management decisions. Negative results must be combined with clinical observations, patient history, and epidemiological information. The expected result is Negative. Fact Sheet for Patients: SugarRoll.be Fact Sheet for Healthcare Providers: https://www.woods-mathews.com/ This test is not yet approved or cleared by the [place] FDA and  has been authorized for detection and/or diagnosis of SARS-CoV-2 by FDA under an Emergency Use Authorization (EUA). This EUA will remain  in effect (meaning this test can be used) for the duration of the COVID-19 declaration under Section 56 4(b)(1) of the Act, 21 U.S.C. section 360bbb-3(b)(1), unless the authorization is terminated or revoked sooner. Performed at [facility], [place] [address], [place], [name] [address]      Time coordinating discharge: 60mins  SIGNED:   Kathie Dike, MD  [facility] 04/26/2019, 7:56 PM   If 7PM-7AM, please contact night-coverage [email or web address]

## 2019-06-12 ENCOUNTER — Encounter: Payer: Self-pay | Admitting: Podiatry

## 2019-06-12 ENCOUNTER — Ambulatory Visit (INDEPENDENT_AMBULATORY_CARE_PROVIDER_SITE_OTHER): Payer: Medicare Other | Admitting: Podiatry

## 2019-06-12 ENCOUNTER — Other Ambulatory Visit: Payer: Self-pay

## 2019-06-12 DIAGNOSIS — B351 Tinea unguium: Secondary | ICD-10-CM | POA: Diagnosis not present

## 2019-06-12 DIAGNOSIS — M79676 Pain in unspecified toe(s): Secondary | ICD-10-CM | POA: Diagnosis not present

## 2019-06-12 NOTE — Progress Notes (Signed)
Complaint:  Visit Type: Patient returns to my office for continued preventative foot care services. Complaint: Patient states" my nails have grown long and thick and become painful to walk and wear shoes" . The patient presents for preventative foot care services.  Podiatric Exam: Vascular: dorsalis pedis and posterior tibial pulses are weakly  palpable bilateral. Capillary return is immediate. Cold feet.. Skin turgor WNL  Sensorium: Normal Semmes Weinstein monofilament test. Normal tactile sensation bilaterally. Nail Exam: Pt has thick disfigured discolored nails with subungual debris noted bilateral entire nail hallux through fifth toenails Ulcer Exam: There is no evidence of ulcer or pre-ulcerative changes or infection. Orthopedic Exam: Muscle tone and strength are WNL. No limitations in general ROM. No crepitus or effusions noted. Foot type and digits show no abnormalities. Bony prominences are unremarkable. Skin: No Porokeratosis. No infection or ulcers.  Healing skin lesion dorsal distal aspect second toe right foot.  Diagnosis:  Onychomycosis, , Pain in right toe, pain in left toes  Treatment & Plan Procedures and Treatment: Consent by patient was obtained for treatment procedures.   Debridement of mycotic and hypertrophic toenails, 1 through 5 bilateral and clearing of subungual debris. No ulceration, no infection noted. Neosporin/DSD second toe right foot. Return Visit-Office Procedure: Patient instructed to return to the office for a follow up visit 3 months for continued evaluation and treatment.    Gardiner Barefoot DPM

## 2019-06-16 ENCOUNTER — Ambulatory Visit: Payer: Medicare Other | Admitting: Podiatry

## 2019-09-11 ENCOUNTER — Ambulatory Visit (INDEPENDENT_AMBULATORY_CARE_PROVIDER_SITE_OTHER): Payer: Medicare Other

## 2019-09-11 ENCOUNTER — Other Ambulatory Visit: Payer: Self-pay

## 2019-09-11 ENCOUNTER — Encounter: Payer: Self-pay | Admitting: Podiatry

## 2019-09-11 ENCOUNTER — Ambulatory Visit (INDEPENDENT_AMBULATORY_CARE_PROVIDER_SITE_OTHER): Payer: Medicare Other | Admitting: Podiatry

## 2019-09-11 VITALS — Temp 97.6°F

## 2019-09-11 DIAGNOSIS — L97511 Non-pressure chronic ulcer of other part of right foot limited to breakdown of skin: Secondary | ICD-10-CM | POA: Diagnosis not present

## 2019-09-11 DIAGNOSIS — L97512 Non-pressure chronic ulcer of other part of right foot with fat layer exposed: Secondary | ICD-10-CM | POA: Diagnosis not present

## 2019-09-12 ENCOUNTER — Encounter: Payer: Self-pay | Admitting: Podiatry

## 2019-09-12 NOTE — Progress Notes (Signed)
Subjective:  Patient ID: [name], female    DOB: [birth year],  MRN: [id number]  Chief Complaint  Patient presents with  . Foot Ulcer    right foot, great /2nd toes are painful,swollen since 1 week    86 y.o. female presents for wound care.  Patient presents with right first interspace wound that has been going on for quite some time.  There has been sore associated with it there has been drainage present.  Patient states that they have not done anything for this.  Patient has a history of cognitive impairment and a history of gout.  She is also has CKD.  She does not recall how the wound started.  Unable to obtain proper history.  She has not done anything to take care of the wound.  She has been ambulating in regular sneakers.   Review of Systems: Negative except as noted in the HPI. Denies N/V/F/Ch.  Past Medical History:  Diagnosis Date  . Arthritis   . GERD (gastroesophageal reflux disease)   . Hypertension   . Neck fracture ([place]) 2015   C2    Current Outpatient Medications:  .  allopurinol (ZYLOPRIM) 300 MG tablet, Take 300 mg by mouth daily., Disp: , Rfl:  .  amLODipine (NORVASC) 5 MG tablet, Take 1 tablet (5 mg total) by mouth daily., Disp: 30 tablet, Rfl: 11 .  COLCRYS 0.6 MG tablet, Take 1 tablet by mouth daily as needed., Disp: , Rfl:  .  diclofenac Sodium (VOLTAREN) 1 % GEL, Apply 2 g topically 2 (two) times daily., Disp: , Rfl:  .  docusate sodium (COLACE) 100 MG capsule, Docusate Sodium 100 MG Oral Capsule QTY: 0 capsule Days: 0 Refills: 0  Written: 06/01/15 Patient Instructions:, Disp: , Rfl:  .  folic acid (FOLVITE) 1 MG tablet, Folic Acid 1 MG Oral Tablet QTY: 90 each Days: 90 Refills: 0  Written: 04/19/18 Patient Instructions: TAKE ONE TABLET BY MOUTH EVERY DAY, Disp: , Rfl:  .  hydrALAZINE (APRESOLINE) 50 MG tablet, Take 50 mg by mouth 2 (two) times daily. , Disp: , Rfl:  .  HYDROcodone-acetaminophen (NORCO/VICODIN) 5-325 MG tablet, Take 1 tablet by mouth 2  (two) times daily. , Disp: , Rfl:  .  pantoprazole (PROTONIX) 40 MG tablet, Pantoprazole Sodium 40 MG Oral Tablet Delayed Release QTY: 30 each Days: 30 Refills: 2  Written: 07/05/18 Patient Instructions: TAKE 1 TABLET BY MOUTH DAILY, Disp: , Rfl:  .  POLY-IRON 150 FORTE 150-25-1 MG-MCG-MG CAPS, Take 1 capsule by mouth every morning., Disp: , Rfl:  .  sertraline (ZOLOFT) 25 MG tablet, Take 25 mg by mouth daily with breakfast. , Disp: , Rfl:  .  tiZANidine (ZANAFLEX) 2 MG tablet, Take 4 mg by mouth at bedtime., Disp: , Rfl:  .  UNABLE TO FIND, APPLY TWICE DAILY TO KNEE, Disp: , Rfl:   Social History   Tobacco Use  Smoking Status Never Smoker  Smokeless Tobacco Never Used    No Known Allergies Objective:   Vitals:   09/11/19 1417  Temp: 97.6 F (36.4 C)   There is no height or weight on file to calculate BMI. Constitutional Well developed. Well nourished.  Vascular Dorsalis pedis pulses palpable bilaterally. Posterior tibial pulses palpable bilaterally. Capillary refill normal to all digits.  No cyanosis or clubbing noted. Pedal hair growth normal.  Neurologic Normal speech. Oriented to person, place, and time. Protective sensation absent  Dermatologic Wound Location: Right first interspace wound no clinical signs of infection  noted.  No malodor noted.   Wound Base: Mixed Granular/Fibrotic Peri-wound: Normal Exudate: Scant/small amount Serosanguinous exudate Wound Measurements: -See below  Orthopedic: No pain to palpation either foot.   Radiographs: 3 views of skeletally mature adult right foot: Diffuse osteopenia noted.  Clinical nature increase in talar declination angle these findings are consistent with pes planus.  Vessel calcifications noted to the interspaces.  No signs of osteomyelitic changes noted.  No other bony abnormalities noted.  Assessment:   1. Ulcer of right foot with fat layer exposed ([place])    Plan:  Patient was evaluated and treated and all questions  answered.  Ulcer right first interspace with fat layer exposed -Debridement as below. -Dressed with Betadine wet-to-dry, DSD. -Continue off-loading with surgical shoe. -Patient has poor circulation to the right lower extremity.  I believe she will benefit from ABIs PVRs to assess the vascular flow to the lower extremity.  If poor I will refer to vascular specialist to improve the blood flow. -Surgical shoe was dispensed   Procedure: Excisional Debridement of Wound Tool: Sharp chisel blade/tissue nipper Rationale: Removal of non-viable soft tissue from the wound to promote healing.  Anesthesia: none Pre-Debridement Wound Measurements: 4 cm x 3 cm x 0.3 cm  Post-Debridement Wound Measurements: 4 cm x 3 cm x 0.3 cm  Type of Debridement: Sharp Excisional Tissue Removed: Non-viable soft tissue Blood loss: Minimal (<50cc) Depth of Debridement: subcutaneous tissue. Technique: Sharp excisional debridement to bleeding, viable wound base.  Wound Progress: Today will be the first debridement.  We will continue to monitor wound progression. Site healing conversation 7 Dressing: Dry, sterile, compression dressing. Disposition: Patient tolerated procedure well. Patient to return in 1 week for follow-up.  No follow-ups on file.

## 2019-09-18 ENCOUNTER — Ambulatory Visit (INDEPENDENT_AMBULATORY_CARE_PROVIDER_SITE_OTHER): Payer: Medicare Other | Admitting: Podiatry

## 2019-09-18 ENCOUNTER — Other Ambulatory Visit: Payer: Self-pay

## 2019-09-18 DIAGNOSIS — I999 Unspecified disorder of circulatory system: Secondary | ICD-10-CM

## 2019-09-18 DIAGNOSIS — L97512 Non-pressure chronic ulcer of other part of right foot with fat layer exposed: Secondary | ICD-10-CM

## 2019-09-18 MED ORDER — TRAMADOL HCL 50 MG PO TABS: 50.0000 mg | ORAL_TABLET | Freq: Three times a day (TID) | ORAL | 0 refills | Status: AC | PRN

## 2019-09-18 MED ORDER — DOXYCYCLINE HYCLATE 100 MG PO TABS: 100.0000 mg | ORAL_TABLET | Freq: Two times a day (BID) | ORAL | 0 refills | Status: DC

## 2019-09-19 ENCOUNTER — Telehealth: Payer: Self-pay | Admitting: *Deleted

## 2019-09-19 ENCOUNTER — Encounter: Payer: Self-pay | Admitting: Podiatry

## 2019-09-19 DIAGNOSIS — I999 Unspecified disorder of circulatory system: Secondary | ICD-10-CM

## 2019-09-19 DIAGNOSIS — L97512 Non-pressure chronic ulcer of other part of right foot with fat layer exposed: Secondary | ICD-10-CM

## 2019-09-19 NOTE — Telephone Encounter (Signed)
Faxed orders for ABI with TBI and arterial doppler to [facility].

## 2019-09-19 NOTE — Progress Notes (Signed)
Subjective:  Patient ID: [name], female    DOB: 03-11-1921,  MRN: [id number]  Chief Complaint  Patient presents with  . Foot Ulcer    pt is here for a ulcer f/u of the right foot, pt is well bandaged, has been wearing surgical shoe, and also has possible abi's done.    86 y.o. female presents for wound care.  Patient presents with right first interspace wound that has been going on for quite some time.  This is a follow-up from the wound.  Patient states it looks about the same.  They have attempted to do a lot of Betadine wet-to-dry dressing changes but has not been helping.  Patient has been wearing surgical shoe.  She denies any other acute complaints beside pain.  She has not taken anything for pain medication she would like to know if there is something that could be dispensed for pain control.   Review of Systems: Negative except as noted in the HPI. Denies N/V/F/Ch.  Past Medical History:  Diagnosis Date  . Arthritis   . GERD (gastroesophageal reflux disease)   . Hypertension   . Neck fracture ([place]) 2015   C2    Current Outpatient Medications:  .  allopurinol (ZYLOPRIM) 300 MG tablet, Take 300 mg by mouth daily., Disp: , Rfl:  .  amLODipine (NORVASC) 5 MG tablet, Take 1 tablet (5 mg total) by mouth daily., Disp: 30 tablet, Rfl: 11 .  COLCRYS 0.6 MG tablet, Take 1 tablet by mouth daily as needed., Disp: , Rfl:  .  diclofenac Sodium (VOLTAREN) 1 % GEL, Apply 2 g topically 2 (two) times daily., Disp: , Rfl:  .  docusate sodium (COLACE) 100 MG capsule, Docusate Sodium 100 MG Oral Capsule QTY: 0 capsule Days: 0 Refills: 0  Written: 06/01/15 Patient Instructions:, Disp: , Rfl:  .  doxycycline (VIBRA-TABS) 100 MG tablet, Take 1 tablet (100 mg total) by mouth 2 (two) times daily., Disp: 20 tablet, Rfl: 0 .  folic acid (FOLVITE) 1 MG tablet, Folic Acid 1 MG Oral Tablet QTY: 90 each Days: 90 Refills: 0  Written: 04/19/18 Patient Instructions: TAKE ONE TABLET BY MOUTH EVERY DAY,  Disp: , Rfl:  .  hydrALAZINE (APRESOLINE) 50 MG tablet, Take 50 mg by mouth 2 (two) times daily. , Disp: , Rfl:  .  HYDROcodone-acetaminophen (NORCO/VICODIN) 5-325 MG tablet, Take 1 tablet by mouth 2 (two) times daily. , Disp: , Rfl:  .  metoprolol tartrate (LOPRESSOR) 25 MG tablet, Take 25 mg by mouth 2 (two) times daily., Disp: , Rfl:  .  pantoprazole (PROTONIX) 40 MG tablet, Pantoprazole Sodium 40 MG Oral Tablet Delayed Release QTY: 30 each Days: 30 Refills: 2  Written: 07/05/18 Patient Instructions: TAKE 1 TABLET BY MOUTH DAILY, Disp: , Rfl:  .  POLY-IRON 150 FORTE 150-25-1 MG-MCG-MG CAPS, Take 1 capsule by mouth every morning., Disp: , Rfl:  .  sertraline (ZOLOFT) 25 MG tablet, Take 25 mg by mouth daily with breakfast. , Disp: , Rfl:  .  tiZANidine (ZANAFLEX) 2 MG tablet, Take 4 mg by mouth at bedtime., Disp: , Rfl:  .  traMADol (ULTRAM) 50 MG tablet, Take 1 tablet (50 mg total) by mouth every 8 (eight) hours as needed for up to 5 days., Disp: 15 tablet, Rfl: 0 .  UNABLE TO FIND, APPLY TWICE DAILY TO KNEE, Disp: , Rfl:   Social History   Tobacco Use  Smoking Status Never Smoker  Smokeless Tobacco Never Used  No Known Allergies Objective:   There were no vitals filed for this visit. There is no height or weight on file to calculate BMI. Constitutional Well developed. Well nourished.  Vascular Dorsalis pedis pulses palpable bilaterally. Posterior tibial pulses palpable bilaterally. Capillary refill normal to all digits.  No cyanosis or clubbing noted. Pedal hair growth normal.  Neurologic Normal speech. Oriented to person, place, and time. Protective sensation absent  Dermatologic Wound Location: Right first interspace wound no clinical signs of infection noted.  No malodor noted.   Wound Base: Mixed Granular/Fibrotic Peri-wound: Normal Exudate: Scant/small amount Serosanguinous exudate Wound Measurements: -See below  Orthopedic: No pain to palpation either foot.    Radiographs: 3 views of skeletally mature adult right foot: Diffuse osteopenia noted.  Clinical nature increase in talar declination angle these findings are consistent with pes planus.  Vessel calcifications noted to the interspaces.  No signs of osteomyelitic changes noted.  No other bony abnormalities noted.  Assessment:   1. Ulcer of right foot with fat layer exposed ([place])   2. Vascular abnormality    Plan:  Patient was evaluated and treated and all questions answered.  Ulcer right first interspace with fat layer exposed -Debridement as below. -Dressed with Betadine wet-to-dry, DSD. -Continue off-loading with surgical shoe. -Patient has poor circulation to the right lower extremity.  I believe she will benefit from ABIs PVRs to assess the vascular flow to the lower extremity.  If poor I will refer to vascular specialist to improve the blood flow. -ABIs PVRs were ordered -Surgical shoe was dispensed -I discussed with the patient that she is at a high risk of losing the digits if worsening of the ulceration.  At this time patient has significant fibrotic tissue to the area.   Procedure: Excisional Debridement of Wound Tool: Sharp chisel blade/tissue nipper Rationale: Removal of non-viable soft tissue from the wound to promote healing.  Anesthesia: none Pre-Debridement Wound Measurements: 4 cm x 3 cm x 0.3 cm  Post-Debridement Wound Measurements: 3.9 cm x 2.9 cm x 0.3 cm Type of Debridement: Sharp Excisional Tissue Removed: Non-viable soft tissue Blood loss: Minimal (<50cc) Depth of Debridement: subcutaneous tissue. Technique: Sharp excisional debridement to bleeding, viable wound base.  Wound Progress: The wound appears to be stagnant at this time.   Site healing conversation 7 Dressing: Dry, sterile, compression dressing. Disposition: Patient tolerated procedure well. Patient to return in 1 week for follow-up.  Return for f/u on teusday.Marland Kitchen

## 2019-09-19 NOTE — Telephone Encounter (Signed)
-----   Message from [name], DPM sent at 09/19/2019  6:50 AM EDT ----- Regarding: ABIs PVRs Hi [name],  Can you order his ABI PVR for this patient to assess the vascular flow to the right lower extremity.  Thank you very much

## 2019-09-23 ENCOUNTER — Ambulatory Visit (INDEPENDENT_AMBULATORY_CARE_PROVIDER_SITE_OTHER): Payer: Medicare Other | Admitting: Podiatry

## 2019-09-23 ENCOUNTER — Other Ambulatory Visit: Payer: Self-pay

## 2019-09-23 ENCOUNTER — Other Ambulatory Visit: Payer: Self-pay | Admitting: Podiatry

## 2019-09-23 DIAGNOSIS — I739 Peripheral vascular disease, unspecified: Secondary | ICD-10-CM | POA: Diagnosis not present

## 2019-09-23 DIAGNOSIS — I999 Unspecified disorder of circulatory system: Secondary | ICD-10-CM

## 2019-09-23 DIAGNOSIS — L97512 Non-pressure chronic ulcer of other part of right foot with fat layer exposed: Secondary | ICD-10-CM | POA: Diagnosis not present

## 2019-09-23 NOTE — Progress Notes (Signed)
   HPI: 86 y.o. female presenting today with her daughter-in-law, [name], for evaluation of a wound to the right first interspace of the right foot.  The wound has been treated conservatively here in the office under Dr. Posey Pronto.  She is currently on oral doxycycline 100 mg 2 times daily.  There is also a wound care nurse coming to the house changing the dressings weekly.  She presents for further treatment evaluation  Past Medical History:  Diagnosis Date  . Arthritis   . GERD (gastroesophageal reflux disease)   . Hypertension   . Neck fracture ([place]) 2015   C2     Physical Exam: General: The patient is alert and oriented x3 in no acute distress.  Dermatology: Ulceration noted to the first interspace of the right foot extending to the dorsum of the foot.  Overall the wound measures approximately 5 cm in length x3 cm in width x0.3 cm in depth.  Heavy amount of necrotic tissue and wet eschar noted to the ulceration site.  There is a mild malodor noted.  Moderate serous drainage noted.  Periwound integrity intact.  Vascular: NonPalpable pedal pulses bilaterally.  Mild edema noted to the bilateral feet with a delayed capillary refill of the digits  Neurological: Epicritic and protective threshold diminished bilaterally.   Musculoskeletal Exam: Range of motion within normal limits to all pedal and ankle joints bilateral given the patient's age. Muscle strength 5/5 in all groups bilateral.   Assessment: 1.  Ulcer first interspace right foot 2.  Peripheral vascular disease right lower extremity   Plan of Care:  1. Patient evaluated. 2.  Medically necessary excisional debridement including subcutaneous tissue was performed using a tissue nipper.  Excisional debridement of some of the necrotic nonviable tissue down the healthier tissue was performed with post debridement measurement same as pre- 3.  Recommend Betadine wet-to-dry dressings every other day 4.  Betadine wet to dry sterile dressings  were applied today 5.  Today cultures were taken and sent to pathology for culture and sensitivity 6.  Continue oral doxycycline 100 mg 2 times daily as prescribed 7.  Vascular referral and ABIs are pending.  Today our office will follow up with vascular studies.  I did explain to the patient and daughter-in-law that in order to have the wounds heal we will need to optimize her vascular circulation in her lower extremities 8.  Return to clinic in 2 weeks  *Daughter-in-law is [name], DPM [facility]  Dr. Edrick Kins, DPM    [address], [place] [address]                Office [phone]  Fax [phone]

## 2019-09-25 ENCOUNTER — Telehealth: Payer: Self-pay

## 2019-09-25 NOTE — Telephone Encounter (Signed)
-----   Message from Edrick Kins, DPM sent at 09/23/2019  2:47 PM EDT ----- Regarding: vascular consult Please order arterial dopplers and vascular referral RLE  Dx: PVD ulcer right foot.   Thanks, Dr. Amalia Hailey

## 2019-09-25 NOTE — Telephone Encounter (Signed)
Referral has been faxed to [facility]

## 2019-09-28 ENCOUNTER — Other Ambulatory Visit: Payer: Self-pay | Admitting: Podiatry

## 2019-09-28 LAB — WOUND CULTURE

## 2019-09-29 ENCOUNTER — Telehealth: Payer: Self-pay | Admitting: *Deleted

## 2019-09-29 NOTE — Telephone Encounter (Signed)
"  I'm calling in regards to [name].  I called the pharmacy about her medicine.  She took her last antibiotic and she needs her Tramadol.  The Tramadol helps her with the pain, she suffers so with that.  The pharmacy is going to contact you.  I contacted them first and they told me to call you too."

## 2019-09-30 ENCOUNTER — Other Ambulatory Visit: Payer: Self-pay | Admitting: Podiatry

## 2019-09-30 ENCOUNTER — Ambulatory Visit: Payer: Medicare Other | Admitting: Podiatry

## 2019-09-30 MED ORDER — TRAMADOL HCL 50 MG PO TABS: 50.0000 mg | ORAL_TABLET | Freq: Four times a day (QID) | ORAL | 0 refills | Status: AC | PRN

## 2019-09-30 NOTE — Progress Notes (Signed)
PRN pain 

## 2019-09-30 NOTE — Telephone Encounter (Signed)
Dr. Amalia Hailey sent in a refill for the Tramadol to the pharmacy.  He stated patient does not need a refill on the antibiotic.

## 2019-10-01 ENCOUNTER — Encounter (INDEPENDENT_AMBULATORY_CARE_PROVIDER_SITE_OTHER): Payer: Medicare Other

## 2019-10-01 ENCOUNTER — Ambulatory Visit (INDEPENDENT_AMBULATORY_CARE_PROVIDER_SITE_OTHER): Payer: Medicare Other

## 2019-10-01 ENCOUNTER — Other Ambulatory Visit: Payer: Self-pay

## 2019-10-01 DIAGNOSIS — I999 Unspecified disorder of circulatory system: Secondary | ICD-10-CM

## 2019-10-01 DIAGNOSIS — L97512 Non-pressure chronic ulcer of other part of right foot with fat layer exposed: Secondary | ICD-10-CM

## 2019-10-07 ENCOUNTER — Ambulatory Visit: Payer: Medicare Other | Admitting: Podiatry

## 2019-10-08 ENCOUNTER — Encounter: Payer: Medicare Other | Attending: Internal Medicine | Admitting: Internal Medicine

## 2019-10-08 ENCOUNTER — Other Ambulatory Visit: Payer: Self-pay

## 2019-10-08 DIAGNOSIS — I739 Peripheral vascular disease, unspecified: Secondary | ICD-10-CM | POA: Diagnosis not present

## 2019-10-08 DIAGNOSIS — L97513 Non-pressure chronic ulcer of other part of right foot with necrosis of muscle: Secondary | ICD-10-CM | POA: Insufficient documentation

## 2019-10-08 DIAGNOSIS — N183 Chronic kidney disease, stage 3 unspecified: Secondary | ICD-10-CM | POA: Diagnosis not present

## 2019-10-08 DIAGNOSIS — I129 Hypertensive chronic kidney disease with stage 1 through stage 4 chronic kidney disease, or unspecified chronic kidney disease: Secondary | ICD-10-CM | POA: Insufficient documentation

## 2019-10-08 DIAGNOSIS — D649 Anemia, unspecified: Secondary | ICD-10-CM | POA: Insufficient documentation

## 2019-10-08 DIAGNOSIS — M1990 Unspecified osteoarthritis, unspecified site: Secondary | ICD-10-CM | POA: Insufficient documentation

## 2019-10-08 DIAGNOSIS — L97519 Non-pressure chronic ulcer of other part of right foot with unspecified severity: Secondary | ICD-10-CM | POA: Diagnosis present

## 2019-10-08 DIAGNOSIS — M109 Gout, unspecified: Secondary | ICD-10-CM | POA: Diagnosis not present

## 2019-10-08 NOTE — Progress Notes (Signed)
[name], [name] ([id number]) Visit Report for 10/08/2019 Abuse/Suicide Risk Screen Details Patient Name: [name], [name]. Date of Service: 10/08/2019 9:45 AM Medical Record Number: [id number] Patient Account Number: [id number] Date of Birth/Sex: 20-Dec-1920 (86 y.o. F) Treating RN: Army Melia Primary Care Veneta Sliter: Pernell Dupre Other Clinician: Referring Licet Dunphy: Pernell Dupre Treating Kylah Maresh/Extender: Beverly Gust in Treatment: 0 Abuse/Suicide Risk Screen Items Answer ABUSE RISK SCREEN: Has anyone close to you tried to hurt or harm you recentlyo No Do you feel uncomfortable with anyone in your familyo No Has anyone forced you do things that you didnot want to doo No Electronic Signature(s) Signed: 10/08/2019 11:17:30 AM By: Army Melia Entered By: Army Melia on 10/08/2019 10:19:05 [name] ([id number]) -------------------------------------------------------------------------------- Activities of Daily Living Details Patient Name: [name], REACH. Date of Service: 10/08/2019 9:45 AM Medical Record Number: [id number] Patient Account Number: [id number] Date of Birth/Sex: Sep 27, 1920 (86 y.o. F) Treating RN: Army Melia Primary Care Faraz Ponciano: Pernell Dupre Other Clinician: Referring Macallan Ord: Pernell Dupre Treating Camdin Hegner/Extender: Beverly Gust in Treatment: 0 Activities of Daily Living Items Answer Activities of Daily Living (Please select one for each item) Drive Automobile Not Able Take Medications Need Assistance Use Telephone Need Assistance Care for Appearance Need Assistance Use Toilet Need Assistance Bath / Shower Need Assistance Dress Self Need Assistance Feed Self Need Assistance Walk Need Assistance Get In / Out Bed Need Assistance Housework Need Assistance Prepare Meals Need Assistance Handle Money Need Assistance Shop for Self Need Assistance Electronic Signature(s) Signed: 10/08/2019 11:17:30 AM By: Army Melia Entered By: Army Melia on 10/08/2019 10:19:16 [name] ([id number]) -------------------------------------------------------------------------------- Education Screening Details Patient Name: [name]. Date of Service: 10/08/2019 9:45 AM Medical Record Number: [id number] Patient Account Number: [id number] Date of Birth/Sex: 1921-04-09 (86 y.o. F) Treating RN: Army Melia Primary Care Rome Schlauch: Pernell Dupre Other Clinician: Referring Elvera Almario: Pernell Dupre Treating Miquan Tandon/Extender: Beverly Gust in Treatment: 0 Primary Learner Assessed: Patient Learning Preferences/Education Level/Primary Language Learning Preference: Explanation Highest Education Level: High School Preferred Language: English Cognitive Barrier Language Barrier: No Translator Needed: No Memory Deficit: No Emotional Barrier: No Cultural/Religious Beliefs Affecting Medical Care: No Physical Barrier Impaired Vision: No Impaired Hearing: No Decreased Hand dexterity: No Knowledge/Comprehension Knowledge Level: High Comprehension Level: High Ability to understand written instructions: High Ability to understand verbal instructions: High Motivation Anxiety Level: Calm Cooperation: Cooperative Education Importance: Acknowledges Need Interest in Health Problems: Asks Questions Perception: Coherent Willingness to Engage in Self-Management High Activities: Readiness to Engage in Self-Management High Activities: Electronic Signature(s) Signed: 10/08/2019 11:17:30 AM By: Army Melia Entered By: Army Melia on 10/08/2019 10:19:35 [name] ([id number]) -------------------------------------------------------------------------------- Fall Risk Assessment Details Patient Name: [name]. Date of Service: 10/08/2019 9:45 AM Medical Record Number: [id number] Patient Account Number: [id number] Date of Birth/Sex: 1920-10-06 (86 y.o. F) Treating RN: Army Melia Primary  Care Zaedyn Covin: Pernell Dupre Other Clinician: Referring Esmae Donathan: Pernell Dupre Treating Jashay Roddy/Extender: Beverly Gust in Treatment: 0 Fall Risk Assessment Items Have you had 2 or more falls in the last 12 monthso 0 No Have you had any fall that resulted in injury in the last 12 monthso 0 No FALLS RISK SCREEN History of falling - immediate or within 3 months 0 No Secondary diagnosis (Do you have 2 or more medical diagnoseso) 0 No Ambulatory aid None/bed rest/wheelchair/nurse 0 Yes Crutches/cane/walker 0 No Furniture 0 No Intravenous therapy Access/Saline/Heparin Lock 0 No Gait/Transferring Normal/ bed rest/ wheelchair 0 Yes Weak (short steps with or without shuffle, stooped  but able to lift head while walking, may 0 No seek support from furniture) Impaired (short steps with shuffle, may have difficulty arising from chair, head down, impaired 0 No balance) Mental Status Oriented to own ability 0 No Electronic Signature(s) Signed: 10/08/2019 11:17:30 AM By: Army Melia Entered By: Army Melia on 10/08/2019 10:19:47 [name] ([id number]) -------------------------------------------------------------------------------- Foot Assessment Details Patient Name: [name]. Date of Service: 10/08/2019 9:45 AM Medical Record Number: [id number] Patient Account Number: [id number] Date of Birth/Sex: 1920/06/13 (86 y.o. F) Treating RN: Army Melia Primary Care Ysidra Sopher: Pernell Dupre Other Clinician: Referring Ashrith Sagan: Pernell Dupre Treating Bridgitt Raggio/Extender: Beverly Gust in Treatment: 0 Foot Assessment Items Site Locations + = Sensation present, - = Sensation absent, C = Callus, U = Ulcer R = Redness, W = Warmth, M = Maceration, PU = Pre-ulcerative lesion F = Fissure, S = Swelling, D = Dryness Assessment Right: Left: Other Deformity: No No Prior Foot Ulcer: No No Prior Amputation: No No Charcot Joint: No No Ambulatory Status:  Non-ambulatory Assistance Device: Wheelchair Gait: Administrator, arts) Signed: 10/08/2019 11:17:30 AM By: Army Melia Entered By: Army Melia on 10/08/2019 10:21:33 [name] ([id number]) -------------------------------------------------------------------------------- Nutrition Risk Screening Details Patient Name: Harvard, [name]. Date of Service: 10/08/2019 9:45 AM Medical Record Number: [id number] Patient Account Number: [id number] Date of Birth/Sex: 09-18-1920 (86 y.o. F) Treating RN: Army Melia Primary Care Deyon Chizek: Pernell Dupre Other Clinician: Referring Jaan Fischel: Pernell Dupre Treating Aliese Brannum/Extender: Beverly Gust in Treatment: 0 Height (in): 65 Weight (lbs): 126 Body Mass Index (BMI): 21 Nutrition Risk Screening Items Score Screening NUTRITION RISK SCREEN: I have an illness or condition that made me change the kind and/or amount of food I eat 0 No I eat fewer than two meals per day 0 No I eat few fruits and vegetables, or milk products 0 No I have three or more drinks of beer, liquor or wine almost every day 0 No I have tooth or mouth problems that make it hard for me to eat 0 No I don't always have enough money to buy the food I need 0 No I eat alone most of the time 0 No I take three or more different prescribed or over-the-counter drugs a day 0 No Without wanting to, I have lost or gained 10 pounds in the last six months 0 No I am not always physically able to shop, cook and/or feed myself 0 No Nutrition Protocols Good Risk Protocol 0 No interventions needed Moderate Risk Protocol High Risk Proctocol Risk Level: Good Risk Score: 0 Electronic Signature(s) Signed: 10/08/2019 11:17:30 AM By: Army Melia Entered By: Army Melia on 10/08/2019 10:19:52

## 2019-10-09 NOTE — Progress Notes (Signed)
JEVAEH, SHAMS ([id number]) Visit Report for 10/08/2019 Allergy List Details Patient Name: Natalie, [name]. Date of Service: 10/08/2019 9:45 AM Medical Record Number: [id number] Patient Account Number: [id number] Date of Birth/Sex: Feb 13, 1921 (86 y.o. F) Treating RN: Army Melia Primary Care Adrien Shankar: Pernell Dupre Other Clinician: Referring Edward Trevino: Pernell Dupre Treating Alveena Taira/Extender: Beverly Gust in Treatment: 0 Allergies Active Allergies No Known Allergies Allergy Notes Electronic Signature(s) Signed: 10/08/2019 11:17:30 AM By: Army Melia Entered By: Army Melia on 10/08/2019 10:15:37 [name], [name] ([id number]) -------------------------------------------------------------------------------- Arrival Information Details Patient Name: [name], [name]. Date of Service: 10/08/2019 9:45 AM Medical Record Number: [id number] Patient Account Number: [id number] Date of Birth/Sex: 03-02-1921 (86 y.o. F) Treating RN: Army Melia Primary Care Cherity Blickenstaff: Pernell Dupre Other Clinician: Referring Alhassan Everingham: Pernell Dupre Treating Geramy Lamorte/Extender: Beverly Gust in Treatment: 0 Visit Information Patient Arrived: Wheel Chair Arrival Time: 10:01 Accompanied By: caregiver Transfer Assistance: None Patient Identification Verified: Yes Electronic Signature(s) Signed: 10/08/2019 11:17:30 AM By: Army Melia Entered By: Army Melia on 10/08/2019 10:01:45 [name], [name] ([id number]) -------------------------------------------------------------------------------- Clinic Level of Care Assessment Details Patient Name: [name], [name]. Date of Service: 10/08/2019 9:45 AM Medical Record Number: [id number] Patient Account Number: [id number] Date of Birth/Sex: Oct 13, 1920 (86 y.o. F) Treating RN: Cornell Barman Primary Care Gamaliel Charney: Pernell Dupre Other Clinician: Referring Ryli Standlee: Pernell Dupre Treating Sharmayne Jablon/Extender: Beverly Gust in Treatment:  0 Clinic Level of Care Assessment Items TOOL 2 Quantity Score []  - Use when only an EandM is performed on the INITIAL visit 0 ASSESSMENTS - Nursing Assessment / Reassessment X - General Physical Exam (combine w/ comprehensive assessment (listed just below) when performed on new 1 20 pt. evals) X- 1 25 Comprehensive Assessment (HX, ROS, Risk Assessments, Wounds Hx, etc.) ASSESSMENTS - Wound and Skin Assessment / Reassessment []  - Simple Wound Assessment / Reassessment - one wound 0 X- 3 5 Complex Wound Assessment / Reassessment - multiple wounds []  - 0 Dermatologic / Skin Assessment (not related to wound area) ASSESSMENTS - Ostomy and/or Continence Assessment and Care []  - Incontinence Assessment and Management 0 []  - 0 Ostomy Care Assessment and Management (repouching, etc.) PROCESS - Coordination of Care []  - Simple Patient / Family Education for ongoing care 0 X- 1 20 Complex (extensive) Patient / Family Education for ongoing care X- 1 10 Staff obtains Programmer, systems, Records, Test Results / Process Orders []  - 0 Staff telephones HHA, Nursing Homes / Clarify orders / etc []  - 0 Routine Transfer to another Facility (non-emergent condition) []  - 0 Routine Hospital Admission (non-emergent condition) X- 1 15 New Admissions / Biomedical engineer / Ordering NPWT, Apligraf, etc. []  - 0 Emergency Hospital Admission (emergent condition) X- 1 10 Simple Discharge Coordination []  - 0 Complex (extensive) Discharge Coordination PROCESS - Special Needs []  - Pediatric / Minor Patient Management 0 []  - 0 Isolation Patient Management []  - 0 Hearing / Language / Visual special needs []  - 0 Assessment of Community assistance (transportation, D/C planning, etc.) []  - 0 Additional assistance / Altered mentation []  - 0 Support Surface(s) Assessment (bed, cushion, seat, etc.) INTERVENTIONS - Wound Cleansing / Measurement X - Wound Imaging (photographs - any number of wounds) 1 5 []  -  0 Wound Tracing (instead of photographs) []  - 0 Simple Wound Measurement - one wound X- 3 5 Complex Wound Measurement - multiple wounds [name], [name] ([id number]) []  - 0 Simple Wound Cleansing - one wound X- 3 5 Complex Wound Cleansing - multiple wounds INTERVENTIONS - Wound Dressings []  -  Small Wound Dressing one or multiple wounds 0 []  - 0 Medium Wound Dressing one or multiple wounds X- 1 20 Large Wound Dressing one or multiple wounds []  - 0 Application of Medications - injection INTERVENTIONS - Miscellaneous []  - External ear exam 0 []  - 0 Specimen Collection (cultures, biopsies, blood, body fluids, etc.) []  - 0 Specimen(s) / Culture(s) sent or taken to Lab for analysis []  - 0 Patient Transfer (multiple staff / Civil Service fast streamer / Similar devices) []  - 0 Simple Staple / Suture removal (25 or less) []  - 0 Complex Staple / Suture removal (26 or more) []  - 0 Hypo / Hyperglycemic Management (close monitor of Blood Glucose) []  - 0 Ankle / Brachial Index (ABI) - do not check if billed separately Has the patient been seen at the hospital within the last three years: Yes Total Score: 170 Level Of Care: New/Established - Level 5 Electronic Signature(s) Signed: 10/08/2019 5:27:22 PM By: Gretta Cool, BSN, RN, CWS, Kim RN, BSN Entered By: Gretta Cool, BSN, RN, CWS, Kim on 10/08/2019 10:50:18 [name], [name] ([id number]) -------------------------------------------------------------------------------- Encounter Discharge Information Details Patient Name: [name], [name]. Date of Service: 10/08/2019 9:45 AM Medical Record Number: [id number] Patient Account Number: [id number] Date of Birth/Sex: 1920/11/09 (86 y.o. F) Treating RN: Cornell Barman Primary Care Rasheka Denard: Pernell Dupre Other Clinician: Referring Brienna Bass: Pernell Dupre Treating Ivee Poellnitz/Extender: Beverly Gust in Treatment: 0 Encounter Discharge Information Items Discharge Condition: Stable Ambulatory Status:  [place] Discharge Destination: Home Transportation: Private Auto Accompanied By: daughter in law Schedule Follow-up Appointment: Yes Clinical Summary of Care: Electronic Signature(s) Signed: 10/08/2019 5:27:22 PM By: Gretta Cool, BSN, RN, CWS, Kim RN, BSN Entered By: Gretta Cool, BSN, RN, CWS, Kim on 10/08/2019 10:48:49 [place], [name] ([id number]) -------------------------------------------------------------------------------- Lower Extremity Assessment Details Patient Name: [place], [name]. Date of Service: 10/08/2019 9:45 AM Medical Record Number: [id number] Patient Account Number: [id number] Date of Birth/Sex: [birth year] (86 y.o. F) Treating RN: Army Melia Primary Care Tasheika Kitzmiller: Pernell Dupre Other Clinician: Referring Simrah Chatham: Pernell Dupre Treating Quinto Tippy/Extender: Beverly Gust in Treatment: 0 Edema Assessment Assessed: [Left: No] [Right: No] Edema: [Left: No] [Right: Yes] Calf Left: Right: Point of Measurement: 34 cm From Medial Instep 28 cm 28 cm Ankle Left: Right: Point of Measurement: 10 cm From Medial Instep 24 cm 25.5 cm Vascular Assessment Pulses: Dorsalis Pedis Palpable: [Left:Yes] [Right:Yes] Posterior Tibial Palpable: [Left:Yes] [Right:Yes] Electronic Signature(s) Signed: 10/08/2019 11:17:30 AM By: Army Melia Entered By: Army Melia on 10/08/2019 10:15:03 [place], [name] ([id number]) -------------------------------------------------------------------------------- Multi Wound Chart Details Patient Name: [place], [name]. Date of Service: 10/08/2019 9:45 AM Medical Record Number: [id number] Patient Account Number: [id number] Date of Birth/Sex: 06/13/1920 (86 y.o. F) Treating RN: Cornell Barman Primary Care Manon Banbury: Pernell Dupre Other Clinician: Referring Karliah Kowalchuk: Pernell Dupre Treating Laqueisha Catalina/Extender: Beverly Gust in Treatment: 0 Vital Signs Height(in): 65 Pulse(bpm): 51 Weight(lbs): 126 Blood Pressure(mmHg): 135/55 Body Mass  Index(BMI): 21 Temperature(F): 98.3 Respiratory Rate(breaths/min): 16 Photos: Wound Location: Right Toe - Web between 1st and Right, Dorsal Toe Second Right, Dorsal Foot 2nd Wounding Event: Gradually Appeared Gradually Appeared Gradually Appeared Primary Etiology: Vasculitis Vasculitis Vasculitis Date Acquired: 09/08/2019 09/08/2019 09/08/2019 Weeks of Treatment: 0 0 0 Wound Status: Open Open Open Measurements L x W x D (cm) 3.8x2x0.1 1x1x0.1 2x3x0.1 Area (cm) : 5.969 0.785 4.712 Volume (cm) : 0.597 0.079 0.471 % Reduction in Area: N/A 0.00% N/A % Reduction in Volume: N/A 0.00% N/A Classification: Full Thickness Without Exposed Full Thickness Without Exposed Full Thickness Without Exposed Support Structures Support Structures Support  Structures Exudate Amount: Medium Medium Medium Exudate Type: Serosanguineous Serosanguineous Serosanguineous Exudate Color: red, brown red, brown red, brown Granulation Amount: Small (1-33%) None Present (0%) Large (67-100%) Granulation Quality: Pink N/A Red Necrotic Amount: Large (67-100%) Large (67-100%) Small (1-33%) Necrotic Tissue: Eschar, Adherent Slough Eschar, Adherent Slough N/A Exposed Structures: Fat Layer (Subcutaneous Tissue) Fat Layer (Subcutaneous Tissue) Fat Layer (Subcutaneous Tissue) Exposed: Yes Exposed: Yes Exposed: Yes Fascia: No Fascia: No Fascia: No Tendon: No Tendon: No Tendon: No Muscle: No Muscle: No Muscle: No Joint: No Joint: No Joint: No Bone: No Bone: No Bone: No Epithelialization: None None None Treatment Notes Electronic Signature(s) Signed: 10/08/2019 5:27:22 PM By: Gretta Cool, BSN, RN, CWS, Kim RN, BSN Entered By: Gretta Cool, BSN, RN, CWS, Kim on 10/08/2019 10:31:59 [place], [name] ([id number]) -------------------------------------------------------------------------------- Multi-Disciplinary Care Plan Details Patient Name: [place], [name]. Date of Service: 10/08/2019 9:45 AM Medical Record Number:  [id number] Patient Account Number: [id number] Date of Birth/Sex: Feb 25, 1921 (86 y.o. F) Treating RN: Cornell Barman Primary Care Evens Meno: Pernell Dupre Other Clinician: Referring Lamonte Hartt: Pernell Dupre Treating Eller Sweis/Extender: Beverly Gust in Treatment: 0 Active Inactive Necrotic Tissue Nursing Diagnoses: Impaired tissue integrity related to necrotic/devitalized tissue Knowledge deficit related to management of necrotic/devitalized tissue Goals: Necrotic/devitalized tissue will be minimized in the wound bed Date Initiated: 10/08/2019 Target Resolution Date: 10/15/2019 Goal Status: Active Patient/caregiver will verbalize understanding of reason and process for debridement of necrotic tissue Date Initiated: 10/08/2019 Target Resolution Date: 10/15/2019 Goal Status: Active Interventions: Assess patient pain level pre-, during and post procedure and prior to discharge Provide education on necrotic tissue and debridement process Treatment Activities: Apply topical anesthetic as ordered : 10/08/2019 Notes: Orientation to the Wound Care Program Nursing Diagnoses: Knowledge deficit related to the wound healing center program Goals: Patient/caregiver will verbalize understanding of the [place] Date Initiated: 10/08/2019 Target Resolution Date: 10/15/2019 Goal Status: Active Interventions: Provide education on orientation to the wound center Notes: Pain, Acute or Chronic Nursing Diagnoses: Pain, acute or chronic: actual or potential Potential alteration in comfort, pain Goals: Patient will verbalize adequate pain control and receive pain control interventions during procedures as needed Date Initiated: 10/08/2019 Target Resolution Date: 10/15/2019 Goal Status: Active Interventions: Assess comfort goal upon admission Complete pain assessment as per visit requirements Notes: [place], [name] ([id number]) Soft Tissue Infection Nursing Diagnoses: Impaired  tissue integrity Goals: Patient will remain free of wound infection Date Initiated: 10/08/2019 Target Resolution Date: 10/15/2019 Goal Status: Active Interventions: Assess signs and symptoms of infection every visit Notes: Tissue Oxygenation Nursing Diagnoses: Actual ineffective tissue perfusion; peripheral (select once diagnosis is confirmed) Goals: Patient/caregiver will verbalize understanding of disease process and disease management Date Initiated: 10/08/2019 Target Resolution Date: 10/15/2019 Goal Status: Active Interventions: Assess peripheral arterial status upon admission and as needed Treatment Activities: Invasive vascular studies : 10/08/2019 Notes: Wound/Skin Impairment Nursing Diagnoses: Impaired tissue integrity Goals: Patient/caregiver will verbalize understanding of skin care regimen Date Initiated: 10/08/2019 Target Resolution Date: 10/15/2019 Goal Status: Active Interventions: Assess ulceration(s) every visit Treatment Activities: Skin care regimen initiated : 10/08/2019 Topical wound management initiated : 10/08/2019 Notes: Electronic Signature(s) Signed: 10/08/2019 5:27:22 PM By: Gretta Cool, BSN, RN, CWS, Kim RN, BSN Entered By: Gretta Cool, BSN, RN, CWS, Kim on 10/08/2019 10:31:31 [place], [name] ([id number]) -------------------------------------------------------------------------------- Pain Assessment Details Patient Name: [name], Natalie Date of Service: 10/08/2019 9:45 AM Medical Record Number: [id number] Patient Account Number: [id number] Date of Birth/Sex: 07/10/1920 (86 y.o. F) Treating RN: Army Melia Primary Care Davari Lopes: Pernell Dupre Other Clinician:  Referring Aayana Reinertsen: Pernell Dupre Treating Gaylynn Seiple/Extender: Beverly Gust in Treatment: 0 Active Problems Location of Pain Severity and Description of Pain Patient Has Paino Yes Site Locations Pain Location: Pain in Ulcers Rate the pain. Current Pain Level: 10 Pain Management and  Medication Current Pain Management: Electronic Signature(s) Signed: 10/08/2019 11:17:30 AM By: Army Melia Entered By: Army Melia on 10/08/2019 10:01:55 [place], [name] ([id number]) -------------------------------------------------------------------------------- Wound Assessment Details Patient Name: [place], [name]. Date of Service: 10/08/2019 9:45 AM Medical Record Number: [id number] Patient Account Number: [id number] Date of Birth/Sex: [birth year] (86 y.o. F) Treating RN: Army Melia Primary Care Kesia Dalto: Pernell Dupre Other Clinician: Referring Lynnox Girten: Pernell Dupre Treating Isamu Trammel/Extender: Beverly Gust in Treatment: 0 Wound Status Wound Number: 1 Primary Etiology: Arterial Insufficiency Ulcer Wound Location: Right Toe - Web between 1st and 2nd Wound Status: Open Wounding Event: Gradually Appeared Comorbid History: Anemia, Hypertension, Gout, Osteoarthritis Date Acquired: 09/08/2019 Weeks Of Treatment: 0 Clustered Wound: No Photos Wound Measurements Length: (cm) 3.8 Width: (cm) 2 Depth: (cm) 0.1 Area: (cm) 5.969 Volume: (cm) 0.597 % Reduction in Area: 0% % Reduction in Volume: 0% Epithelialization: None Tunneling: No Undermining: No Wound Description Classification: Full Thickness Without Exposed Support Structu Exudate Amount: Medium Exudate Type: Serosanguineous Exudate Color: red, brown res Foul Odor After Cleansing: No Slough/Fibrino Yes Wound Bed Granulation Amount: Small (1-33%) Exposed Structure Granulation Quality: Pink Fascia Exposed: No Necrotic Amount: Large (67-100%) Fat Layer (Subcutaneous Tissue) Exposed: Yes Necrotic Quality: Eschar, Adherent Slough Tendon Exposed: No Muscle Exposed: No Joint Exposed: No Bone Exposed: No Treatment Notes Wound #1 (Right Toe - Web between 1st and 2nd) Notes Betadine, light gauze Electronic Signature(s) Signed: 10/08/2019 11:17:30 AM By: Army Melia Signed: 10/08/2019 5:27:22 PM By:  Gretta Cool BSN, RN, CWS, Kim RN, BSN [place], [place] ([id number]) Entered By: Gretta Cool, BSN, RN, CWS, Kim on 10/08/2019 10:40:35 [name], [name] ([id number]) -------------------------------------------------------------------------------- Wound Assessment Details Patient Name: Natalie, [name]. Date of Service: 10/08/2019 9:45 AM Medical Record Number: [id number] Patient Account Number: [id number] Date of Birth/Sex: January 24, 1921 (86 y.o. F) Treating RN: Army Melia Primary Care Varick Keys: Pernell Dupre Other Clinician: Referring Twylia Oka: Pernell Dupre Treating Montrel Donahoe/Extender: Beverly Gust in Treatment: 0 Wound Status Wound Number: 2 Primary Etiology: Arterial Insufficiency Ulcer Wound Location: Right, Dorsal Toe Second Wound Status: Open Wounding Event: Gradually Appeared Comorbid History: Anemia, Hypertension, Gout, Osteoarthritis Date Acquired: 09/08/2019 Weeks Of Treatment: 0 Clustered Wound: No Photos Wound Measurements Length: (cm) 1 Width: (cm) 1 Depth: (cm) 0.1 Area: (cm) 0.785 Volume: (cm) 0.079 % Reduction in Area: 0% % Reduction in Volume: 0% Epithelialization: None Tunneling: No Undermining: No Wound Description Classification: Full Thickness Without Exposed Support Structu Exudate Amount: Medium Exudate Type: Serosanguineous Exudate Color: red, brown res Foul Odor After Cleansing: No Slough/Fibrino Yes Wound Bed Granulation Amount: None Present (0%) Exposed Structure Necrotic Amount: Large (67-100%) Fascia Exposed: No Necrotic Quality: Eschar, Adherent Slough Fat Layer (Subcutaneous Tissue) Exposed: Yes Tendon Exposed: No Muscle Exposed: No Joint Exposed: No Bone Exposed: No Treatment Notes Wound #2 (Right, Dorsal Toe Second) Notes Betadine, light gauze Electronic Signature(s) Signed: 10/08/2019 11:17:30 AM By: Army Melia Signed: 10/08/2019 5:27:22 PM By: Gretta Cool BSN, RN, CWS, Kim RN, BSN [place], [place] ([id number]) Entered By: Gretta Cool,  BSN, RN, CWS, Kim on 10/08/2019 10:40:52 [place], [name] ([id number]) -------------------------------------------------------------------------------- Wound Assessment Details Patient Name: Natalie, [name]. Date of Service: 10/08/2019 9:45 AM Medical Record Number: [id number] Patient Account Number: [id number] Date of Birth/Sex: 1921/02/21 (86 y.o. F) Treating RN: Army Melia  Primary Care Lilliam Chamblee: Pernell Dupre Other Clinician: Referring Milton Sagona: Pernell Dupre Treating Kyna Blahnik/Extender: Beverly Gust in Treatment: 0 Wound Status Wound Number: 3 Primary Etiology: Arterial Insufficiency Ulcer Wound Location: Right, Dorsal Foot Wound Status: Open Wounding Event: Gradually Appeared Comorbid History: Anemia, Hypertension, Gout, Osteoarthritis Date Acquired: 09/08/2019 Weeks Of Treatment: 0 Clustered Wound: No Photos Wound Measurements Length: (cm) 2 Width: (cm) 3 Depth: (cm) 0.1 Area: (cm) 4.712 Volume: (cm) 0.471 % Reduction in Area: 0% % Reduction in Volume: 0% Epithelialization: None Tunneling: No Undermining: No Wound Description Classification: Full Thickness Without Exposed Support Structu Exudate Amount: Medium Exudate Type: Serosanguineous Exudate Color: red, brown res Foul Odor After Cleansing: No Slough/Fibrino Yes Wound Bed Granulation Amount: Large (67-100%) Exposed Structure Granulation Quality: Red Fascia Exposed: No Necrotic Amount: Small (1-33%) Fat Layer (Subcutaneous Tissue) Exposed: Yes Tendon Exposed: No Muscle Exposed: No Joint Exposed: No Bone Exposed: No Treatment Notes Wound #3 (Right, Dorsal Foot) Notes Betadine, light gauze Electronic Signature(s) Signed: 10/08/2019 11:17:30 AM By: Army Melia Signed: 10/08/2019 5:27:22 PM By: Gretta Cool BSN, RN, CWS, Kim RN, BSN [place], [place] ([id number]) Entered By: Gretta Cool, BSN, RN, CWS, Kim on 10/08/2019 10:41:15 [place], [name]  ([id number]) -------------------------------------------------------------------------------- Vitals Details Patient Name: [place], [name]. Date of Service: 10/08/2019 9:45 AM Medical Record Number: [id number] Patient Account Number: [id number] Date of Birth/Sex: Aug 29, 1920 (86 y.o. F) Treating RN: Army Melia Primary Care Amarii Bordas: Pernell Dupre Other Clinician: Referring Tiara Bartoli: Pernell Dupre Treating Winnie Barsky/Extender: Beverly Gust in Treatment: 0 Vital Signs Time Taken: 10:01 Temperature (F): 98.3 Height (in): 65 Pulse (bpm): 64 Source: Stated Respiratory Rate (breaths/min): 16 Weight (lbs): 126 Blood Pressure (mmHg): 135/55 Source: Stated Reference Range: 80 - 120 mg / dl Body Mass Index (BMI): 21 Electronic Signature(s) Signed: 10/08/2019 11:17:30 AM By: Army Melia Entered By: Army Melia on 10/08/2019 10:02:39

## 2019-10-09 NOTE — Progress Notes (Signed)
NINAMARIE, KEEL ([id number]) Visit Report for 10/08/2019 Chief Complaint Document Details Patient Name: [name], [name]. Date of Service: 10/08/2019 9:45 AM Medical Record Number: [id number] Patient Account Number: [id number] Date of Birth/Sex: 05/16/1920 (86 y.o. F) Treating RN: Cornell Barman Primary Care Provider: Pernell Dupre Other Clinician: Referring Provider: Pernell Dupre Treating Provider/Extender: Beverly Gust in Treatment: 0 Information Obtained from: Patient Chief Complaint Right Foot Ulcer x 5 weeks Electronic Signature(s) Signed: 10/08/2019 10:47:03 AM By: Tobi Bastos MD, MBA Entered By: Tobi Bastos on 10/08/2019 10:47:03 Ante, Charlynne Cousins ([id number]) -------------------------------------------------------------------------------- HPI Details Patient Name: [name]. Date of Service: 10/08/2019 9:45 AM Medical Record Number: [id number] Patient Account Number: [id number] Date of Birth/Sex: 05-11-1921 (86 y.o. F) Treating RN: Cornell Barman Primary Care Provider: Pernell Dupre Other Clinician: Referring Provider: Pernell Dupre Treating Provider/Extender: Beverly Gust in Treatment: 0 History of Present Illness HPI Description: 86 year old female who can provide very little history, accompanied by daughter-in-law with whom she stays who provides relevant history-patient was noted to have a right first webspace ulceration about 4 weeks ago and thereafter was taken to podiatry where she was seen on 09/23/19 and had excisional debridement of eschar with 5 x 3 x 0.3 cm wound on the dorsum of the first webspace extending proximally. Patient was then referred to [facility] readings suggest bilateral peripheral arterial disease, ABIs were noncompressible on both sides, from mid SFA onward monophasic flow was documented on both sides, with no PTA flow detectable on either side. Since the time that patient had the podiatry appointment over the  past 2 weeks patient's daughter-in-law has been using Betadine soaks to the wounds, with the home health coming once a week. Patient has significant pain mostly at night according to daughter-in-law, was started on tramadol and then hydrocodone by her PCP and continues to have discomfort. She is able to ambulate carefully. No other symptoms reported such as chest pain or shortness of breath or dizziness. Patient's history of hypertension with no other prior medical problems no history of strokes or heart attacks or diabetes Electronic Signature(s) Signed: 10/08/2019 10:50:23 AM By: Tobi Bastos MD, MBA Entered By: Tobi Bastos on 10/08/2019 10:50:22 Paolella, Charlynne Cousins ([id number]) -------------------------------------------------------------------------------- Physical Exam Details Patient Name: Rupert, Charlynne Cousins. Date of Service: 10/08/2019 9:45 AM Medical Record Number: [id number] Patient Account Number: [id number] Date of Birth/Sex: 1921-02-18 (86 y.o. F) Treating RN: Cornell Barman Primary Care Provider: Pernell Dupre Other Clinician: Referring Provider: Pernell Dupre Treating Provider/Extender: Beverly Gust in Treatment: 0 Constitutional alert and oriented x 3. sitting or standing blood pressure is within target range for patient.. supine blood pressure is within target range for patient.. pulse regular and within target range for patient.Marland Kitchen respirations regular, non-labored and within target range for patient.Marland Kitchen temperature within target range for patient.. . . Well-nourished and well-hydrated in no acute distress. Eyes conjunctiva clear no eyelid edema noted. pupils equal round and reactive to light and accommodation. Ears, Nose, Mouth, and Throat no gross abnormality of ear auricles or external auditory canals. normal hearing noted during conversation. mucus membranes moist. Neck supple with no LAD noted in anterior or posterior cervical chain. not  enlarged. Respiratory normal breathing without difficulty. clear to auscultation bilaterally. Cardiovascular regular rate and rhythm with normal S1, S2. no bruits with no significant JVD. 2+ femoral pulses. Absent posterior tibial and dorsalis pedis pulses bilateral lower extremities. trace pitting edema of the bilateral lower extremities. Gastrointestinal (GI) soft, non-tender, non-distended, +BS. no hepatosplenomegaly. no ventral hernia noted. Musculoskeletal normal  gait and posture. no significant deformity or arthritic changes, no loss or range of motion, no clubbing. full range of motion without deformity. full range of motion without deformity. full range of motion with greater than 10 degrees of flexion of the ankle. full range of motion with greater than 10 degrees of flexion of the ankle. Integumentary (Hair, Skin) normal hair distribution and pattern. skin pink, warm, dry. Neurological cranial nerves 2-12 intact. Patient has normal sensation in the feet bilaterally to light touch. Psychiatric pleasant and cooperative. Notes Patient's right lower extremity has a webspace ulceration that has a bad odor, this is extending from the webspace back proximally at least 5 cm with pale necrotic looking tissue and eschar there is also 3 other small open areas more proximally on the forefoot, there is some forefoot edema, pulses up not palpable in the posterior tibials anterior tibialis or dorsalis pedis pedis on either side Electronic Signature(s) Signed: 10/08/2019 10:52:29 AM By: Tobi Bastos MD, MBA Previous Signature: 10/08/2019 10:51:59 AM Version By: Tobi Bastos MD, MBA Entered By: Tobi Bastos on 10/08/2019 10:52:27 Balding, Charlynne Cousins ([id number]) -------------------------------------------------------------------------------- Physician Orders Details Patient Name: [place], Charlynne Cousins. Date of Service: 10/08/2019 9:45 AM Medical Record Number: [id number] Patient Account Number:  [id number] Date of Birth/Sex: 1920-09-19 (86 y.o. F) Treating RN: Cornell Barman Primary Care Provider: Pernell Dupre Other Clinician: Referring Provider: Pernell Dupre Treating Provider/Extender: Beverly Gust in Treatment: 0 Verbal / Phone Orders: No Diagnosis Coding Primary Wound Dressing Wound #1 Right Toe - Web between 1st and 2nd o Other: - Betadine Wound #2 Right,Dorsal Toe Second o Other: - Betadine Wound #3 Right,Dorsal Foot o Other: - Betadine Secondary Dressing Wound #1 Right Toe - Web between 1st and 2nd o Conform/Kerlix Wound #2 Right,Dorsal Toe Second o Conform/Kerlix Wound #3 Right,Dorsal Foot o Conform/Kerlix Dressing Change Frequency Wound #1 Right Toe - Web between 1st and 2nd o Change dressing every day. Wound #2 Right,Dorsal Toe Second o Change dressing every day. Wound #3 Right,Dorsal Foot o Change dressing every day. Follow-up Appointments Wound #1 Right Toe - Web between 1st and 2nd o Return Appointment in 2 weeks. Wound #2 Right,Dorsal Toe Second o Return Appointment in 2 weeks. Wound #3 Right,Dorsal Foot o Return Appointment in 2 weeks. Home Health Wound #1 Right Toe - Web between 1st and 2nd o [place] Visits - [facility]: Monday and Thursday visits. o Home Health Nurse may visit PRN to address patientos wound care needs. o FACE TO FACE ENCOUNTER: MEDICARE and MEDICAID PATIENTS: I certify that this patient is under my care and that I had a face-to-face encounter that meets the physician face-to-face encounter requirements with this patient on this date. The encounter with the patient was in whole or in part for the following MEDICAL CONDITION: (primary reason for [place]) MEDICAL NECESSITY: I certify, that based on my findings, NURSING services are a medically necessary home health service. HOME BOUND STATUS: I certify that my clinical findings support that this patient is homebound (i.e.,  Due to illness or injury, pt requires aid of supportive devices such as crutches, cane, wheelchairs, walkers, the use of special transportation or the assistance of another person to leave their place of residence. There is a normal inability to leave the home and doing so requires considerable and taxing effort. Other absences are for medical reasons / religious services and are infrequent or of short duration when for other reasons). o If current dressing causes regression in wound condition, may D/C  ordered dressing product/s and apply Normal Saline Moist Dressing daily until next [place] / Other MD appointment. [place] of regression in wound condition at [phone]. o Please direct any NON-WOUND related issues/requests for orders to patient's Primary Care Physician [place], ALYA SMALTZ ([id number]) Wound #2 [place] Visits - [facility]: Monday and Thursday visits. o Home Health Nurse may visit PRN to address patientos wound care needs. o FACE TO FACE ENCOUNTER: MEDICARE and MEDICAID PATIENTS: I certify that this patient is under my care and that I had a face-to-face encounter that meets the physician face-to-face encounter requirements with this patient on this date. The encounter with the patient was in whole or in part for the following MEDICAL CONDITION: (primary reason for [place]) MEDICAL NECESSITY: I certify, that based on my findings, NURSING services are a medically necessary home health service. HOME BOUND STATUS: I certify that my clinical findings support that this patient is homebound (i.e., Due to illness or injury, pt requires aid of supportive devices such as crutches, cane, wheelchairs, walkers, the use of special transportation or the assistance of another person to leave their place of residence. There is a normal inability to leave the home and doing so requires considerable and taxing  effort. Other absences are for medical reasons / religious services and are infrequent or of short duration when for other reasons). o If current dressing causes regression in wound condition, may D/C ordered dressing product/s and apply Normal Saline Moist Dressing daily until next [place] / Other MD appointment. [place] of regression in wound condition at [phone]. o Please direct any NON-WOUND related issues/requests for orders to patient's Primary Care Physician Wound #3 [place] Visits - [facility]: Monday and Thursday visits. o Home Health Nurse may visit PRN to address patientos wound care needs. o FACE TO FACE ENCOUNTER: MEDICARE and MEDICAID PATIENTS: I certify that this patient is under my care and that I had a face-to-face encounter that meets the physician face-to-face encounter requirements with this patient on this date. The encounter with the patient was in whole or in part for the following MEDICAL CONDITION: (primary reason for [place]) MEDICAL NECESSITY: I certify, that based on my findings, NURSING services are a medically necessary home health service. HOME BOUND STATUS: I certify that my clinical findings support that this patient is homebound (i.e., Due to illness or injury, pt requires aid of supportive devices such as crutches, cane, wheelchairs, walkers, the use of special transportation or the assistance of another person to leave their place of residence. There is a normal inability to leave the home and doing so requires considerable and taxing effort. Other absences are for medical reasons / religious services and are infrequent or of short duration when for other reasons). o If current dressing causes regression in wound condition, may D/C ordered dressing product/s and apply Normal Saline Moist Dressing daily until next [place] / Other MD appointment. [place] of regression in wound condition at [phone]. o Please direct any NON-WOUND related issues/requests for orders to patient's Primary Care Physician Consults o Vascular o Pain Clinic - PCP to address pain Electronic Signature(s) Signed: 10/08/2019 3:57:01 PM By: Tobi Bastos MD, MBA Signed: 10/08/2019 5:27:22 PM By: Gretta Cool, BSN, RN, CWS, Kim RN, BSN Entered By: Gretta Cool, BSN, RN, CWS, Kim on 10/08/2019 10:51:08 [place], Charlynne Cousins ([id number]) -------------------------------------------------------------------------------- Problem List Details Patient Name: [place],  Upland J. Date of Service: 10/08/2019 9:45 AM Medical Record Number: [id number] Patient Account Number: [id number] Date of Birth/Sex: 1920-09-13 (86 y.o. 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 Date of Service: 10/08/2019 9:45 AM Medical Record Number: [id number] Patient Account Number: [id number] Date of Birth/Sex: 10-Sep-1920 (86 y.o. F) Treating RN: Cornell Barman Primary Care Provider: Pernell Dupre Other Clinician: Referring Provider: Pernell Dupre Treating Provider/Extender: Beverly Gust in Treatment: 0 Subjective Chief Complaint Information obtained from Patient Right  Foot Ulcer x 5 weeks History of Present Illness (HPI) 86 year old female who can provide very little history, accompanied by daughter-in-law with whom she stays who provides relevant history- patient was noted to have a right first webspace ulceration about 4 weeks ago and thereafter was taken to podiatry where she was seen on 09/23/19 and had excisional debridement of eschar with 5 x 3 x 0.3 cm wound on the dorsum of the first webspace extending proximally. Patient was then referred to [facility] readings suggest bilateral peripheral arterial disease, ABIs were noncompressible on both sides, from mid SFA onward monophasic flow was documented on both sides, with no PTA flow detectable on either side. Since the time that patient had the podiatry appointment over the past 2 weeks patient's daughter-in-law has been using Betadine soaks to the wounds, with the home health coming once a week. Patient has significant pain mostly at night according to daughter-in-law, was started on tramadol and then hydrocodone by her PCP and continues to have discomfort. She is able to ambulate carefully. No other symptoms reported such as chest pain or shortness of breath or dizziness. Patient's history of hypertension with no other prior medical problems no history of strokes or heart attacks or diabetes Patient History Information obtained from Patient. Allergies No Known Allergies Family History Diabetes - Siblings, Hypertension - Siblings, Stroke - Siblings, No family history of Cancer, Heart Disease, Hereditary Spherocytosis, Kidney Disease, Lung Disease, Seizures, Thyroid Problems, Tuberculosis. Social History Never smoker, Marital Status - Widowed, Alcohol Use - Never, Drug Use - No History, Caffeine Use - Never. Medical History Eyes Denies history of Cataracts, Glaucoma, Optic Neuritis Ear/Nose/Mouth/Throat Denies history of Chronic sinus problems/congestion, Middle ear  problems Hematologic/Lymphatic Patient has history of Anemia Denies history of Hemophilia, Human Immunodeficiency Virus, Lymphedema, Sickle Cell Disease Respiratory Denies history of Aspiration, Asthma, Chronic Obstructive Pulmonary Disease (COPD), Pneumothorax, Sleep Apnea, Tuberculosis Cardiovascular Patient has history of Hypertension Denies history of Angina, Arrhythmia, Congestive Heart Failure, Coronary Artery Disease, Deep Vein Thrombosis, Hypotension, Myocardial Infarction, Peripheral Arterial Disease, Peripheral Venous Disease, Phlebitis, Vasculitis Gastrointestinal Denies history of Cirrhosis , Colitis, Crohn s, Hepatitis A, Hepatitis B, Hepatitis C Endocrine Denies history of Type I Diabetes, Type II Diabetes Genitourinary Denies history of End Stage Renal Disease Immunological Denies history of Lupus Erythematosus, Raynaud s, Scleroderma Integumentary (Skin) Denies history of History of Burn, History of pressure wounds Musculoskeletal Patient has history of Gout, Osteoarthritis Denies history of Rheumatoid Arthritis, Osteomyelitis Neurologic Denies history of Dementia, Neuropathy, Quadriplegia, Paraplegia, Seizure Disorder  Oncologic Denies history of Received Chemotherapy, Received Radiation Psychiatric [place], KYLAH MARESH ([id number]) Denies history of Anorexia/bulimia, Confinement Anxiety Review of Systems (ROS) Constitutional Symptoms (General Health) Denies complaints or symptoms of Fatigue, Fever, Chills, Marked Weight Change. 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 Denies complaints or symptoms of Dry Eyes, Glasses / Contacts. Ear/Nose/Mouth/Throat Denies complaints or symptoms of Difficult clearing ears, Sinusitis. Hematologic/Lymphatic Denies complaints or symptoms of Bleeding / Clotting Disorders, Human Immunodeficiency Virus. Respiratory Denies complaints or symptoms of Chronic or frequent coughs, Shortness of  Breath. Cardiovascular Denies complaints or symptoms of Chest pain, LE edema. Gastrointestinal Denies complaints or symptoms of Frequent diarrhea, Nausea, Vomiting. Endocrine Denies complaints or symptoms of Hepatitis, Thyroid disease, Polydypsia (Excessive Thirst). Genitourinary Denies complaints or symptoms of Kidney failure/ Dialysis, Incontinence/dribbling. Immunological Denies complaints or symptoms of Hives, Itching. Integumentary (Skin) Complains or has symptoms of Wounds. Denies complaints or symptoms of Bleeding or bruising tendency, Breakdown, Swelling. Musculoskeletal Denies complaints or symptoms of Muscle Pain, Muscle Weakness. Neurologic Denies complaints or symptoms of Numbness/parasthesias, Focal/Weakness. Psychiatric Denies complaints or symptoms of Anxiety, Claustrophobia. Objective Constitutional alert and oriented x 3. sitting or standing blood pressure is within target range for patient.. supine blood pressure is within target range for patient.. pulse regular and within target range for patient.Marland Kitchen respirations regular, non-labored and within target range for patient.Marland Kitchen temperature within target range for patient.. Well-nourished and well-hydrated in no acute distress. Vitals Time Taken: 10:01 AM, Height: 65 in, Source: Stated, Weight: 126 lbs, Source: Stated, BMI: 21, Temperature: 98.3 F, Pulse: 64 bpm, Respiratory Rate: 16 breaths/min, Blood Pressure: 135/55 mmHg. Eyes conjunctiva clear no eyelid edema noted. pupils equal round and reactive to light and accommodation. Ears, Nose, Mouth, and Throat no gross abnormality of ear auricles or external auditory canals. normal hearing noted during conversation. mucus membranes moist. Neck supple with no LAD noted in anterior or posterior cervical chain. not enlarged. Respiratory normal breathing without difficulty. clear to auscultation bilaterally. Cardiovascular regular rate and rhythm with normal S1, S2. no  bruits with no significant JVD. 2+ femoral pulses. Absent posterior tibial and dorsalis pedis pulses bilateral lower extremities. trace pitting edema of the bilateral lower extremities. Gastrointestinal (GI) soft, non-tender, non-distended, +BS. no hepatosplenomegaly. no ventral hernia noted. Musculoskeletal normal gait and posture. no significant deformity or arthritic changes, no loss or range of motion, no clubbing. full range of motion without deformity. full range of motion without deformity. full range of motion with greater than 10 degrees of flexion of the ankle. full range of motion with greater than 10 degrees of flexion of the ankle. Neurological Hoefling, PEIGHTYN ROBERSON. ([id number]) cranial nerves 2-12 intact. Patient has normal sensation in the feet bilaterally to light touch. Psychiatric pleasant and cooperative. General Notes: Patient's right lower extremity has a webspace ulceration that has a bad odor, this is extending from the webspace back proximally at least 5 cm with pale necrotic looking tissue and eschar there is also 3 other small open areas more proximally on the forefoot, there is some forefoot edema, pulses up not palpable in the posterior tibials anterior tibialis or dorsalis pedis pedis on either side Integumentary (Hair, Skin) normal hair distribution and pattern. skin pink, warm, dry. Wound #1 status is Open. Original cause of wound was Gradually Appeared. The wound is located on the Right Toe - Web between 1st and 2nd. The wound measures 3.8cm length x 2cm width x 0.1cm depth; 5.969cm^2 area and 0.597cm^3 volume. There is  Fat Layer (Subcutaneous Tissue) Exposed exposed. There is no tunneling or undermining noted. There is a medium amount of serosanguineous drainage noted. There is small (1-33%) pink granulation within the wound bed. There is a large (67-100%) amount of necrotic tissue within the wound bed including Eschar and Adherent Slough. Wound #2 status is Open.  Original cause of wound was Gradually Appeared. The wound is located on the Right,Dorsal Toe Second. The wound measures 1cm length x 1cm width x 0.1cm depth; 0.785cm^2 area and 0.079cm^3 volume. There is Fat Layer (Subcutaneous Tissue) Exposed exposed. There is no tunneling or undermining noted. There is a medium amount of serosanguineous drainage noted. There is no granulation within the wound bed. There is a large (67-100%) amount of necrotic tissue within the wound bed including Eschar and Adherent Slough. Wound #3 status is Open. Original cause of wound was Gradually Appeared. The wound is located on the Right,Dorsal Foot. The wound measures 2cm length x 3cm width x 0.1cm depth; 4.712cm^2 area and 0.471cm^3 volume. There is Fat Layer (Subcutaneous Tissue) Exposed exposed. There is no tunneling or undermining noted. There is a medium amount of serosanguineous drainage noted. There is large (67-100%) red granulation within the wound bed. There is a small (1-33%) amount of necrotic tissue within the wound bed. Assessment Active Problems ICD-10 Non-pressure chronic ulcer of other part of right foot with necrosis of muscle Peripheral vascular disease, unspecified Plan Primary Wound Dressing: Wound #1 Right Toe - Web between 1st and 2nd: Other: - Betadine Wound #2 Right,Dorsal Toe Second: Other: - Betadine Wound #3 Right,Dorsal Foot: Other: - Betadine Secondary Dressing: Wound #1 Right Toe - Web between 1st and 2nd: Conform/Kerlix Wound #2 Right,Dorsal Toe Second: Conform/Kerlix Wound #3 Right,Dorsal Foot: Conform/Kerlix Dressing Change Frequency: Wound #1 Right Toe - Web between 1st and 2nd: Change dressing every day. Wound #2 Right,Dorsal Toe Second: Change dressing every day. Wound #3 Right,Dorsal Foot: Change dressing every day. Follow-up Appointments: Wound #1 Right Toe - Web between 1st and 2nd: Return Appointment in 2 weeks. Wound #2 Right,Dorsal Toe Second: Return  Appointment in 2 weeks. Wound #3 Right,Dorsal Foot: Return Appointment in 2 weeks. Home Health: Wound #1 Right Toe - Web between 1st and 2nd: ERRICKA, FALKNER ([id number]) [place] Visits - [facility]: Monday and Thursday visits. Home Health Nurse may visit PRN to address patient s wound care needs. FACE TO FACE ENCOUNTER: MEDICARE and MEDICAID PATIENTS: I certify that this patient is under my care and that I had a face-to-face encounter that meets the physician face-to-face encounter requirements with this patient on this date. The encounter with the patient was in whole or in part for the following MEDICAL CONDITION: (primary reason for [place]) MEDICAL NECESSITY: I certify, that based on my findings, NURSING services are a medically necessary home health service. HOME BOUND STATUS: I certify that my clinical findings support that this patient is homebound (i.e., Due to illness or injury, pt requires aid of supportive devices such as crutches, cane, wheelchairs, walkers, the use of special transportation or the assistance of another person to leave their place of residence. There is a normal inability to leave the home and doing so requires considerable and taxing effort. Other absences are for medical reasons / religious services and are infrequent or of short duration when for other reasons). If current dressing causes regression in wound condition, may D/C ordered dressing product/s and apply Normal Saline Moist Dressing daily until next [place] / Other MD appointment.  [place] of regression in wound condition at [phone]. Please direct any NON-WOUND related issues/requests for orders to patient's Primary Care Physician Wound #2 Right,Dorsal Toe Second: [place] Visits - [facility]: Monday and Thursday visits. Home Health Nurse may visit PRN to address patient s wound care needs. FACE TO FACE ENCOUNTER: MEDICARE and MEDICAID  PATIENTS: I certify that this patient is under my care and that I had a face-to-face encounter that meets the physician face-to-face encounter requirements with this patient on this date. The encounter with the patient was in whole or in part for the following MEDICAL CONDITION: (primary reason for [place]) MEDICAL NECESSITY: I certify, that based on my findings, NURSING services are a medically necessary home health service. HOME BOUND STATUS: I certify that my clinical findings support that this patient is homebound (i.e., Due to illness or injury, pt requires aid of supportive devices such as crutches, cane, wheelchairs, walkers, the use of special transportation or the assistance of another person to leave their place of residence. There is a normal inability to leave the home and doing so requires considerable and taxing effort. Other absences are for medical reasons / religious services and are infrequent or of short duration when for other reasons). If current dressing causes regression in wound condition, may D/C ordered dressing product/s and apply Normal Saline Moist Dressing daily until next [place] / Other MD appointment. [place] of regression in wound condition at [phone]. Please direct any NON-WOUND related issues/requests for orders to patient's Primary Care Physician Wound #3 Right,Dorsal Foot: [place] Visits - [facility]: Monday and Thursday visits. Home Health Nurse may visit PRN to address patient s wound care needs. FACE TO FACE ENCOUNTER: MEDICARE and MEDICAID PATIENTS: I certify that this patient is under my care and that I had a face-to-face encounter that meets the physician face-to-face encounter requirements with this patient on this date. The encounter with the patient was in whole or in part for the following MEDICAL CONDITION: (primary reason for [place]) MEDICAL NECESSITY: I certify, that based on  my findings, NURSING services are a medically necessary home health service. HOME BOUND STATUS: I certify that my clinical findings support that this patient is homebound (i.e., Due to illness or injury, pt requires aid of supportive devices such as crutches, cane, wheelchairs, walkers, the use of special transportation or the assistance of another person to leave their place of residence. There is a normal inability to leave the home and doing so requires considerable and taxing effort. Other absences are for medical reasons / religious services and are infrequent or of short duration when for other reasons). If current dressing causes regression in wound condition, may D/C ordered dressing product/s and apply Normal Saline Moist Dressing daily until next [place] / Other MD appointment. [place] of regression in wound condition at [phone]. Please direct any NON-WOUND related issues/requests for orders to patient's Primary Care Physician Consults ordered were: Vascular, Pain Clinic - PCP to address pain -Continue Betadine soaks to the foot, no signs and doing any debridement considering that this is a necrotic area that is deteriorating due to poor blood supply -Vascular surgery appointment to consider options -Patient has stage III chronic kidney disease that may preclude contrast or at least be cognizant of nephrotoxic effects -Patient will need aggressive pain management given rest pain and arterial insufficiency -Return to clinic in 2 weeks Electronic Signature(s) Signed: 10/08/2019 10:53:51 AM  By: Tobi Bastos MD, MBA Entered By: Tobi Bastos on 10/08/2019 10:53:50 [place], Charlynne Cousins ([id number]) -------------------------------------------------------------------------------- ROS/PFSH Details Patient Name: [place], Charlynne Cousins. Date of Service: 10/08/2019 9:45 AM Medical Record Number: [id number] Patient Account Number: [id number] Date of Birth/Sex:  1921-05-13 (86 y.o. 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 Negative for: Dry Eyes; Glasses / Contacts Medical History: Negative for: Cataracts; Glaucoma; Optic Neuritis Ear/Nose/Mouth/Throat Complaints and Symptoms: Negative for: Difficult clearing ears; Sinusitis Medical History: Negative for: Chronic sinus problems/congestion; Middle ear problems Hematologic/Lymphatic Complaints and Symptoms: Negative for: Bleeding / Clotting Disorders; Human Immunodeficiency Virus Medical History: Positive for: Anemia Negative for: Hemophilia; Human Immunodeficiency Virus; Lymphedema; Sickle Cell Disease Respiratory Complaints and Symptoms: Negative for: Chronic or frequent coughs; Shortness of Breath Medical History: Negative for: Aspiration; Asthma; Chronic Obstructive Pulmonary Disease (COPD); Pneumothorax; Sleep Apnea; Tuberculosis Cardiovascular Complaints and Symptoms: Negative for: Chest pain; LE edema Medical History: Positive for: Hypertension Negative for: Angina; Arrhythmia; Congestive Heart Failure; Coronary Artery Disease; Deep Vein Thrombosis; Hypotension; Myocardial Infarction; Peripheral Arterial Disease; Peripheral Venous Disease; Phlebitis; Vasculitis Gastrointestinal Complaints and Symptoms: Negative for: Frequent diarrhea; Nausea; Vomiting Medical History: Negative for: Cirrhosis ; Colitis; Crohnos; Hepatitis A; Hepatitis B; Hepatitis C Endocrine [place], Teila J. ([id number]) Complaints and Symptoms: Negative for: Hepatitis; Thyroid disease; Polydypsia (Excessive Thirst) Medical  History: Negative for: Type I Diabetes; Type II Diabetes Genitourinary Complaints and Symptoms: Negative for: Kidney failure/ Dialysis; Incontinence/dribbling Medical History: Negative for: End Stage Renal Disease Immunological Complaints and Symptoms: Negative for: Hives; Itching Medical History: Negative for: Lupus Erythematosus; Raynaudos; Scleroderma Integumentary (Skin) Complaints and Symptoms: Positive for: Wounds Negative for: Bleeding or bruising tendency; Breakdown; Swelling Medical History: Negative for: History of Burn; History of pressure wounds Musculoskeletal Complaints and Symptoms: Negative for: Muscle Pain; Muscle Weakness Medical History: Positive for: Gout; Osteoarthritis Negative for: Rheumatoid Arthritis; Osteomyelitis Neurologic Complaints and Symptoms: Negative for: Numbness/parasthesias; Focal/Weakness Medical History: Negative for: Dementia; Neuropathy; Quadriplegia; Paraplegia; Seizure Disorder Psychiatric Complaints and Symptoms: Negative for: Anxiety; Claustrophobia Medical History: Negative for: Anorexia/bulimia; Confinement Anxiety Oncologic Medical History: Negative for: Received Chemotherapy; Received Radiation Immunizations Pneumococcal Vaccine: Received Pneumococcal Vaccination: No Implantable Devices None Family and Social History [place], [place]. ([id number]) Cancer: No; Diabetes: Yes - Siblings; Heart Disease: No; Hereditary Spherocytosis: No; Hypertension: Yes - Siblings; Kidney Disease: No; Lung Disease: No; Seizures: No; Stroke: Yes - Siblings; Thyroid Problems: No; Tuberculosis: No; Never smoker; Marital Status - Widowed; Alcohol Use: Never; Drug Use: No History; Caffeine Use: Never; Financial Concerns: No; Food, Clothing or Shelter Needs: No; Support System Lacking: No; Transportation Concerns: No Electronic Signature(s) Signed: 10/08/2019 11:17:30 AM By: Army Melia Signed: 10/08/2019 3:57:01 PM By: Tobi Bastos MD,  MBA Entered By: Army Melia on 10/08/2019 10:18:57 [place], Charlynne Cousins ([id number]) -------------------------------------------------------------------------------- SuperBill Details Patient Name: [place], Charlynne Cousins. Date of Service: 10/08/2019 Medical Record Number: [id number] Patient Account Number: [id number] Date of Birth/Sex: May 06, 1921 (86 y.o. F) Treating RN: Cornell Barman Primary Care Provider: Pernell Dupre Other Clinician: Referring Provider: Pernell Dupre Treating Provider/Extender: Beverly Gust in Treatment: 0 Diagnosis Coding ICD-10 Codes Code Description [id number] Non-pressure chronic ulcer of other part of right foot with necrosis of muscle I73.9 Peripheral vascular disease, unspecified Facility Procedures CPT4 Code: 64332951 Description:  99215 - WOUND CARE VISIT-LEV 5 EST PT Modifier: Quantity: 1 Physician Procedures CPT4 Code: 6203559 Description: 74163 - WC PHYS LEVEL 4 - NEW PT Modifier: Quantity: 1 CPT4 Code: Description: ICD-10 Diagnosis Description L97.513 Non-pressure chronic ulcer of other part of right foot with necrosis of Modifier: muscle Quantity: Electronic Signature(s) Signed: 10/08/2019 4:54:59 PM By: Gretta Cool, BSN, RN, CWS, Kim RN, BSN Previous Signature: 10/08/2019 10:54:06 AM Version By: Tobi Bastos MD, MBA Entered By: Gretta Cool, BSN, RN, CWS, Kim on 10/08/2019 16:54:58

## 2019-10-17 ENCOUNTER — Other Ambulatory Visit
Admission: RE | Admit: 2019-10-17 | Discharge: 2019-10-17 | Disposition: A | Discharge status: Home / Self Care | Payer: Medicare Other | Source: Ambulatory Visit | Attending: Vascular Surgery | Admitting: Vascular Surgery

## 2019-10-17 ENCOUNTER — Encounter (INDEPENDENT_AMBULATORY_CARE_PROVIDER_SITE_OTHER): Payer: Self-pay | Admitting: Vascular Surgery

## 2019-10-17 ENCOUNTER — Ambulatory Visit (INDEPENDENT_AMBULATORY_CARE_PROVIDER_SITE_OTHER): Payer: Medicare Other | Admitting: Vascular Surgery

## 2019-10-17 ENCOUNTER — Other Ambulatory Visit: Payer: Self-pay

## 2019-10-17 ENCOUNTER — Telehealth (INDEPENDENT_AMBULATORY_CARE_PROVIDER_SITE_OTHER): Payer: Self-pay

## 2019-10-17 ENCOUNTER — Encounter (INDEPENDENT_AMBULATORY_CARE_PROVIDER_SITE_OTHER): Payer: Self-pay

## 2019-10-17 VITALS — BP 103/58 | HR 61 | Ht 66.0 in | Wt 121.0 lb

## 2019-10-17 DIAGNOSIS — Z20822 Contact with and (suspected) exposure to covid-19: Secondary | ICD-10-CM | POA: Insufficient documentation

## 2019-10-17 DIAGNOSIS — Z01812 Encounter for preprocedural laboratory examination: Secondary | ICD-10-CM | POA: Insufficient documentation

## 2019-10-17 DIAGNOSIS — S12101S Unspecified nondisplaced fracture of second cervical vertebra, sequela: Secondary | ICD-10-CM | POA: Diagnosis not present

## 2019-10-17 DIAGNOSIS — N189 Chronic kidney disease, unspecified: Secondary | ICD-10-CM

## 2019-10-17 DIAGNOSIS — I7025 Atherosclerosis of native arteries of other extremities with ulceration: Secondary | ICD-10-CM | POA: Diagnosis not present

## 2019-10-17 DIAGNOSIS — I10 Essential (primary) hypertension: Secondary | ICD-10-CM

## 2019-10-17 LAB — SARS CORONAVIRUS 2 (TAT 6-24 HRS): SARS Coronavirus 2: NEGATIVE

## 2019-10-17 NOTE — Telephone Encounter (Signed)
Patient was seen in office today and scheduled for a right leg angio with Dr. Lucky Cowboy on 10/20/19 with a 12:00 pm arrival time to the [facility]. Covid testing is today 10/17/19 between 8-1 pm at the [facility]. Pre-procedure instructions were discussed with the patient's son and paperwork was also given.

## 2019-10-17 NOTE — Assessment & Plan Note (Signed)
She has previously undergone noninvasive studies which showed fairly severe arterial insufficiency in both lower extremities with markedly reduced pressures in the feet/toes.  Duplex showed severe stenosis of both superficial femoral arteries and likely severe tibial disease as well although the flow was quite sluggish in the tibial vessels.  This is clearly a critical and limb threatening situation.  Without revascularization, major amputation would be expected.  Even with revascularization, the risk of amputation remains high.  I discussed with the patient and her family the serious nature of the situation.  I would recommend an angiogram of the right lower extremity be performed in the very near future at her convenience.  I discussed the pathophysiology and natural history of peripheral arterial disease.  I discussed the various treatment options and obviously one that does not involve general anesthesia for a 85 year old would be preferred.  They voiced their understanding and agree to proceed with right lower extremity angiogram and this has been put on the schedule for June 7.

## 2019-10-17 NOTE — Patient Instructions (Signed)
Peripheral Vascular Disease  Peripheral vascular disease (PVD) is a disease of the blood vessels that are not part of your heart and brain. A simple term for PVD is poor circulation. In most cases, PVD narrows the blood vessels that carry blood from your heart to the rest of your body. This can reduce the supply of blood to your arms, legs, and internal organs, like your stomach or kidneys. However, PVD most often affects a person's lower legs and feet. Without treatment, PVD tends to get worse. PVD can also lead to acute ischemic limb. This is when an arm or leg suddenly cannot get enough blood. This is a medical emergency. Follow these instructions at home: Lifestyle  Do not use any products that contain nicotine or tobacco, such as cigarettes and e-cigarettes. If you need help quitting, ask your doctor.  Lose weight if you are overweight. Or, stay at a healthy weight as told by your doctor.  Eat a diet that is low in fat and cholesterol. If you need help, ask your doctor.  Exercise regularly. Ask your doctor for activities that are right for you. General instructions  Take over-the-counter and prescription medicines only as told by your doctor.  Take good care of your feet: ? Wear comfortable shoes that fit well. ? Check your feet often for any cuts or sores.  Keep all follow-up visits as told by your doctor This is important. Contact a doctor if:  You have cramps in your legs when you walk.  You have leg pain when you are at rest.  You have coldness in a leg or foot.  Your skin changes.  You are unable to get or have an erection (erectile dysfunction).  You have cuts or sores on your feet that do not heal. Get help right away if:  Your arm or leg turns cold, numb, and blue.  Your arms or legs become red, warm, swollen, painful, or numb.  You have chest pain.  You have trouble breathing.  You suddenly have weakness in your face, arm, or leg.  You become very  confused or you cannot speak.  You suddenly have a very bad headache.  You suddenly cannot see. Summary  Peripheral vascular disease (PVD) is a disease of the blood vessels.  A simple term for PVD is poor circulation. Without treatment, PVD tends to get worse.  Treatment may include exercise, low fat and low cholesterol diet, and quitting smoking. This information is not intended to replace advice given to you by your health care provider. Make sure you discuss any questions you have with your health care provider. Document Revised: 04/13/2017 Document Reviewed: 06/08/2016 Elsevier Patient Education  2020 [employer]  

## 2019-10-17 NOTE — Progress Notes (Signed)
Patient ID: [name], female   DOB: [birth year], 86 y.o.   MRN: [id number]  Chief Complaint  Patient presents with  . New Patient (Initial Visit)    Arterial insuff    HPI [name] is a 86 y.o. female.  I am asked to see the patient by Dr. Evette Doffing for evaluation of PAD and a non-healing ulceration of the right foot.  But this has become very painful waking her from sleep.  Is been going on a little over a month.  The patient reports no clear trauma or injury creating the wound.  She has been unable to walk after developing the wound.  She denies any ulceration or rest pain on the left foot currently.  She has no previous vascular interventions to the lower extremities to her or her family's knowledge.  She cannot provide much of the history and her daughter-in-law provides much of the history.  Her son accompanies them as well.  She has previously undergone noninvasive studies which showed fairly severe arterial insufficiency in both lower extremities with markedly reduced pressures in the feet/toes.     Past Medical History:  Diagnosis Date  . Arthritis   . GERD (gastroesophageal reflux disease)   . Hypertension   . Neck fracture ([place]) 2015   C2    Past Surgical History:  Procedure Laterality Date  . ABDOMINAL HYSTERECTOMY    . CHOLECYSTECTOMY  08/06/14  . ESOPHAGOGASTRODUODENOSCOPY (EGD) WITH PROPOFOL N/A 08/03/2016   Procedure: ESOPHAGOGASTRODUODENOSCOPY (EGD) WITH PROPOFOL;  Surgeon: Ronald Lobo, MD;  Location: [facility];  Service: Gastroenterology;  Laterality: N/A;  . EYE SURGERY  2015   cataract     Family History  Problem Relation Age of Onset  . Rheumatologic disease Mother   No bleeding disorders, clotting disorders, autoimmune diseases, or aneurysms.   Social History   Tobacco Use  . Smoking status: Never Smoker  . Smokeless tobacco: Never Used  Substance Use Topics  . Alcohol use: No    Alcohol/week: 0.0 standard drinks  . Drug use: No      No Known Allergies  Current Outpatient Medications  Medication Sig Dispense Refill  . allopurinol (ZYLOPRIM) 300 MG tablet Take 300 mg by mouth daily.    Marland Kitchen amLODipine (NORVASC) 5 MG tablet Take 1 tablet (5 mg total) by mouth daily. 30 tablet 11  . COLCRYS 0.6 MG tablet Take 1 tablet by mouth daily as needed.    . diclofenac Sodium (VOLTAREN) 1 % GEL Apply 2 g topically 2 (two) times daily.    Marland Kitchen docusate sodium (COLACE) 100 MG capsule Docusate Sodium 100 MG Oral Capsule QTY: 0 capsule Days: 0 Refills: 0  Written: 06/01/15 Patient Instructions:    . doxycycline (VIBRA-TABS) 100 MG tablet Take 1 tablet (100 mg total) by mouth 2 (two) times daily. 20 tablet 0  . folic acid (FOLVITE) 1 MG tablet Folic Acid 1 MG Oral Tablet QTY: 90 each Days: 90 Refills: 0  Written: 04/19/18 Patient Instructions: TAKE ONE TABLET BY MOUTH EVERY DAY    . gabapentin (NEURONTIN) 300 MG capsule Take 300 mg by mouth 3 (three) times daily.    . hydrALAZINE (APRESOLINE) 50 MG tablet Take 50 mg by mouth 2 (two) times daily.     Marland Kitchen HYDROcodone-acetaminophen (NORCO) 7.5-325 MG tablet Take 1 tablet by mouth every 6 (six) hours as needed.    Marland Kitchen HYDROcodone-acetaminophen (NORCO/VICODIN) 5-325 MG tablet Take 1 tablet by mouth 2 (two) times daily.     Marland Kitchen  metoprolol tartrate (LOPRESSOR) 25 MG tablet Take 25 mg by mouth 2 (two) times daily.    . pantoprazole (PROTONIX) 40 MG tablet Pantoprazole Sodium 40 MG Oral Tablet Delayed Release QTY: 30 each Days: 30 Refills: 2  Written: 07/05/18 Patient Instructions: TAKE 1 TABLET BY MOUTH DAILY    . POLY-IRON 150 FORTE 150-25-1 MG-MCG-MG CAPS Take 1 capsule by mouth every morning.    . sertraline (ZOLOFT) 25 MG tablet Take 25 mg by mouth daily with breakfast.     . tiZANidine (ZANAFLEX) 2 MG tablet Take 4 mg by mouth at bedtime.    Marland Kitchen UNABLE TO FIND APPLY TWICE DAILY TO KNEE     No current facility-administered medications for this visit.      REVIEW OF SYSTEMS (Negative unless  checked)  Constitutional: [] Weight loss  [] Fever  [] Chills Cardiac: [] Chest pain   [] Chest pressure   [] Palpitations   [] Shortness of breath when laying flat   [] Shortness of breath at rest   [] Shortness of breath with exertion. Vascular:  [] Pain in legs with walking   [x] Pain in legs at rest   [] Pain in legs when laying flat   [] Claudication   [] Pain in feet when walking  [x] Pain in feet at rest  [] Pain in feet when laying flat   [] History of DVT   [] Phlebitis   [] Swelling in legs   [] Varicose veins   [x] Non-healing ulcers Pulmonary:   [] Uses home oxygen   [] Productive cough   [] Hemoptysis   [] Wheeze  [] COPD   [] Asthma Neurologic:  [] Dizziness  [] Blackouts   [] Seizures   [] History of stroke   [] History of TIA  [] Aphasia   [] Temporary blindness   [] Dysphagia   [] Weakness or numbness in arms   [] Weakness or numbness in legs Musculoskeletal:  [x] Arthritis   [] Joint swelling   [x] Joint pain   [] Low back pain Hematologic:  [] Easy bruising  [] Easy bleeding   [] Hypercoagulable state   [] Anemic  [] Hepatitis Gastrointestinal:  [] Blood in stool   [] Vomiting blood  [x] Gastroesophageal reflux/heartburn   [] Abdominal pain Genitourinary:  [] Chronic kidney disease   [] Difficult urination  [] Frequent urination  [] Burning with urination   [] Hematuria Skin:  [] Rashes   [x] Ulcers   [x] Wounds Psychological:  [] History of anxiety   []  History of major depression.    Physical Exam BP (!) 103/58   Pulse 61   Ht 5\' 6"  (1.676 m)   Wt 121 lb (54.9 kg)   BMI 19.53 kg/m  Gen:  WD/WN, NAD.  Appears younger than stated age Head: [place]/AT, No temporalis wasting.  Ear/Nose/Throat: Hearing diminished, nares w/o erythema or drainage, oropharynx w/o Erythema/Exudate Eyes: Conjunctiva clear, sclera non-icteric  Neck: trachea midline.  No JVD.  Pulmonary:  Good air movement, respirations not labored, no use of accessory muscles  Cardiac: Somewhat irregular Vascular:  Vessel Right Left  Radial Palpable Palpable                           DP  not palpable  not palpable  PT  not palpable  not palpable   Gastrointestinal:. No masses, surgical incisions, or scars. Musculoskeletal: M/S 5/5 throughout.  Extremities without ischemic changes.  No deformity or atrophy.  Uses a wheelchair.  Wounds on the right forefoot and top of the foot are dressed currently.  Mild bilateral lower extremity edema. Neurologic: Sensation grossly intact in extremities.  Symmetrical.  Motor exam as listed above. Psychiatric: Judgment and insight appear limited.  Patient's  family provides the history. Dermatologic: Right foot wound is dressed today    Radiology VAS [place] ABI WITH/WO TBI  Result Date: 10/07/2019 LOWER EXTREMITY DOPPLER STUDY Indications: Diminished pulses.  Performing Technologist: Blondell Reveal RT, RDMS, RVT  Examination Guidelines: A complete evaluation includes at minimum, Doppler waveform signals and systolic blood pressure reading at the level of bilateral brachial, anterior tibial, and posterior tibial arteries, when vessel segments are accessible. Bilateral testing is considered an integral part of a complete examination. Photoelectric Plethysmograph (PPG) waveforms and toe systolic pressure readings are included as required and additional duplex testing as needed. Limited examinations for reoccurring indications may be performed as noted.  ABI Findings: +---------+-----------------+-----+------------------+-------------------------+ Right    Rt Pressure      IndexWaveform          Comment                            (mmHg)                                                            +---------+-----------------+-----+------------------+-------------------------+ Brachial 142                                                               +---------+-----------------+-----+------------------+-------------------------+ ATA                            monophasic        non-compressible           +---------+-----------------+-----+------------------+-------------------------+ PTA                            dampened          collateral vessel                                        monophasic                                  +---------+-----------------+-----+------------------+-------------------------+ PERO                           not detected                                +---------+-----------------+-----+------------------+-------------------------+ Great Toe                                        not performed due to  ulcer location &                                                           bandaging                 +---------+-----------------+-----+------------------+-------------------------+ +---------+------------------+-----+-------------------+----------------+ Left     Lt Pressure (mmHg)IndexWaveform           Comment          +---------+------------------+-----+-------------------+----------------+ Brachial 141                                                        +---------+------------------+-----+-------------------+----------------+ ATA                             monophasic         non-compressible +---------+------------------+-----+-------------------+----------------+ PTA                             not detected                        +---------+------------------+-----+-------------------+----------------+ PERO     41                0.29 dampened monophasic                 +---------+------------------+-----+-------------------+----------------+ Great Toe21                0.15 Abnormal                            +---------+------------------+-----+-------------------+----------------+ No previous exam available for comparison.  Summary: Right: Resting right ankle-brachial index indicates noncompressible right lower extremity arteries. Doppler waveforms  suggest significant arterial occlusive disease in the right lower extremity. Left: Resting left ankle-brachial index indicates noncompressible left lower extremity arteries. Doppler waveforms and abnormal TBI suggest significant arterial occlusive disease in the left lower extremity.  *See table(s) above for measurements and observations.  Vascular consult recommended. Electronically signed by Leotis Pain MD on 10/07/2019 at 8:48:24 AM.   Final    VAS [place] LOWER EXTREMITY ARTERIAL DUPLEX  Result Date: 10/07/2019 LOWER EXTREMITY ARTERIAL DUPLEX STUDY Indications: Diminished pulse.  Current ABI: Right=Non-compressible & Left=Non-compressible(TBI=0.15) Performing Technologist: Blondell Reveal RT, RDMS, RVT  Examination Guidelines: A complete evaluation includes B-mode imaging, spectral Doppler, color Doppler, and power Doppler as needed of all accessible portions of each vessel. Bilateral testing is considered an integral part of a complete examination. Limited examinations for reoccurring indications may be performed as noted.  +-----------+--------+-----+---------------+------------+--------+ RIGHT      PSV cm/sRatioStenosis       Waveform    Comments +-----------+--------+-----+---------------+------------+--------+ CFA Mid    125                         biphasic             +-----------+--------+-----+---------------+------------+--------+ DFA        103  biphasic             +-----------+--------+-----+---------------+------------+--------+ SFA Prox   77                          monophasic           +-----------+--------+-----+---------------+------------+--------+ SFA Mid    137                         monophasic           +-----------+--------+-----+---------------+------------+--------+ SFA Distal 62                          monophasic           +-----------+--------+-----+---------------+------------+--------+ POP Prox   500     8.1  75-99%  stenosismonophasic           +-----------+--------+-----+---------------+------------+--------+ POP Distal 48                          monophasic           +-----------+--------+-----+---------------+------------+--------+ ATA Distal 68                          monophasic           +-----------+--------+-----+---------------+------------+--------+ PTA Distal                             not detected         +-----------+--------+-----+---------------+------------+--------+ PERO Distal47                          monophasic           +-----------+--------+-----+---------------+------------+--------+  +-----------+--------+-----+---------------+------------+--------+ LEFT       PSV cm/sRatioStenosis       Waveform    Comments +-----------+--------+-----+---------------+------------+--------+ CFA Mid    94                          biphasic             +-----------+--------+-----+---------------+------------+--------+ DFA        109                         biphasic             +-----------+--------+-----+---------------+------------+--------+ SFA Prox   456     4.9  75-99% stenosisbiphasic             +-----------+--------+-----+---------------+------------+--------+ SFA Mid    74                          monophasic           +-----------+--------+-----+---------------+------------+--------+ SFA Distal 403     5.4  75-99% stenosismonophasic           +-----------+--------+-----+---------------+------------+--------+ POP Prox   26                          monophasic           +-----------+--------+-----+---------------+------------+--------+ POP Distal 38                          monophasic           +-----------+--------+-----+---------------+------------+--------+  ATA Distal 31                          monophasic           +-----------+--------+-----+---------------+------------+--------+ PTA Distal                              not detected         +-----------+--------+-----+---------------+------------+--------+ PERO Distal19                          monophasic           +-----------+--------+-----+---------------+------------+--------+  Summary: Right: 75-99% stenosis noted in the superficial femoral artery and/or popliteal artery. Left: 75-99% stenosis noted in the superficial femoral artery. 75-99% stenosis noted in the superficial femoral artery and/or popliteal artery.  See table(s) above for measurements and observations. Vascular consult recommended. Electronically signed by Leotis Pain MD on 10/07/2019 at 8:48:26 AM.    Final     Labs Recent Results (from the past 2160 hour(s))  WOUND CULTURE     Status: Abnormal   Collection Time: 09/23/19 12:00 AM  Result Value Ref Range   Gram Stain Result Final report    Organism ID, Bacteria Comment     Comment: No white blood cells seen.   Organism ID, Bacteria Comment     Comment: Many gram positive cocci.   Organism ID, Bacteria Comment     Comment: Few gram positive rods.   Aerobic Bacterial Culture Final report (A)    Organism ID, Bacteria Proteus mirabilis (A)     Comment: Heavy growth   Organism ID, Bacteria Escherichia coli (A)     Comment: Heavy growth   Organism ID, Bacteria Mixed skin flora     Comment: Heavy growth   Antimicrobial Susceptibility Comment     Comment:       ** S = Susceptible; I = Intermediate; R = Resistant **                    P = Positive; N = Negative             MICS are expressed in micrograms per mL    Antibiotic                 RSLT#1    RSLT#2    RSLT#3    RSLT#4 Amoxicillin/Clavulanic Acid    S         S Ampicillin                     S         S Cefepime                       S         S Ceftriaxone                    S         S Cefuroxime                     S         S Ciprofloxacin                  S         S Ertapenem  S         S Gentamicin                     S         S Imipenem                                  S Levofloxacin                   S         S Meropenem                      S         S Piperacillin/Tazobactam        S         S Tetracycline                   R         S Tobramycin                     S         S Trimethoprim/Sulfa             S         S     Assessment/Plan:  Chronic kidney disease We will check a creatinine prior to the procedure and hydrate her around the time of the procedure to reduce her nephrotoxicity risk.  This is clearly a difficult limb threatening situation.  HTN (hypertension) blood pressure control important in reducing the progression of atherosclerotic disease. On appropriate oral medications.   Closed fracture of second cervical vertebra ([place]) Healed with nonoperative management.  No longer in a brace.  Atherosclerosis of native arteries of the extremities with ulceration ([place]) She has previously undergone noninvasive studies which showed fairly severe arterial insufficiency in both lower extremities with markedly reduced pressures in the feet/toes.  Duplex showed severe stenosis of both superficial femoral arteries and likely severe tibial disease as well although the flow was quite sluggish in the tibial vessels.  This is clearly a critical and limb threatening situation.  Without revascularization, major amputation would be expected.  Even with revascularization, the risk of amputation remains high.  I discussed with the patient and her family the serious nature of the situation.  I would recommend an angiogram of the right lower extremity be performed in the very near future at her convenience.  I discussed the pathophysiology and natural history of peripheral arterial disease.  I discussed the various treatment options and obviously one that does not involve general anesthesia for a 86 year old would be preferred.  They voiced their understanding and agree to proceed with right lower extremity angiogram and this has been  put on the schedule for June 7.      Leotis Pain 10/17/2019, 11:41 AM   This note was created with Dragon medical transcription system.  Any errors from dictation are unintentional.

## 2019-10-17 NOTE — Assessment & Plan Note (Signed)
Healed with nonoperative management.  No longer in a brace.

## 2019-10-17 NOTE — Assessment & Plan Note (Signed)
We will check a creatinine prior to the procedure and hydrate her around the time of the procedure to reduce her nephrotoxicity risk.  This is clearly a difficult limb threatening situation.

## 2019-10-17 NOTE — Assessment & Plan Note (Signed)
blood pressure control important in reducing the progression of atherosclerotic disease. On appropriate oral medications.  

## 2019-10-19 ENCOUNTER — Other Ambulatory Visit (INDEPENDENT_AMBULATORY_CARE_PROVIDER_SITE_OTHER): Payer: Self-pay | Admitting: Nurse Practitioner

## 2019-10-20 ENCOUNTER — Other Ambulatory Visit: Payer: Self-pay

## 2019-10-20 ENCOUNTER — Encounter
Admission: RE | Disposition: A | Discharge status: Home / Self Care | Payer: Self-pay | Source: Home / Self Care | Attending: Vascular Surgery

## 2019-10-20 ENCOUNTER — Ambulatory Visit
Admission: RE | Admit: 2019-10-20 | Discharge: 2019-10-20 | Disposition: A | Discharge status: Home / Self Care | Payer: Medicare Other | Attending: Vascular Surgery | Admitting: Vascular Surgery

## 2019-10-20 DIAGNOSIS — I70299 Other atherosclerosis of native arteries of extremities, unspecified extremity: Secondary | ICD-10-CM

## 2019-10-20 DIAGNOSIS — I70235 Atherosclerosis of native arteries of right leg with ulceration of other part of foot: Secondary | ICD-10-CM | POA: Insufficient documentation

## 2019-10-20 DIAGNOSIS — N189 Chronic kidney disease, unspecified: Secondary | ICD-10-CM | POA: Insufficient documentation

## 2019-10-20 DIAGNOSIS — K219 Gastro-esophageal reflux disease without esophagitis: Secondary | ICD-10-CM | POA: Diagnosis not present

## 2019-10-20 DIAGNOSIS — L97519 Non-pressure chronic ulcer of other part of right foot with unspecified severity: Secondary | ICD-10-CM | POA: Insufficient documentation

## 2019-10-20 DIAGNOSIS — I129 Hypertensive chronic kidney disease with stage 1 through stage 4 chronic kidney disease, or unspecified chronic kidney disease: Secondary | ICD-10-CM | POA: Insufficient documentation

## 2019-10-20 DIAGNOSIS — L97909 Non-pressure chronic ulcer of unspecified part of unspecified lower leg with unspecified severity: Secondary | ICD-10-CM

## 2019-10-20 DIAGNOSIS — Z79899 Other long term (current) drug therapy: Secondary | ICD-10-CM | POA: Insufficient documentation

## 2019-10-20 DIAGNOSIS — I70239 Atherosclerosis of native arteries of right leg with ulceration of unspecified site: Secondary | ICD-10-CM | POA: Diagnosis not present

## 2019-10-20 HISTORY — PX: LOWER EXTREMITY ANGIOGRAPHY: CATH118251

## 2019-10-20 LAB — BUN: BUN: 38 mg/dL — ABNORMAL HIGH (ref 8–23)

## 2019-10-20 LAB — CREATININE, SERUM
Creatinine, Ser: 2.14 mg/dL — ABNORMAL HIGH (ref 0.44–1.00)
GFR calc Af Amer: 22 mL/min — ABNORMAL LOW (ref >60)
GFR calc non Af Amer: 19 mL/min — ABNORMAL LOW (ref >60)

## 2019-10-20 SURGERY — LOWER EXTREMITY ANGIOGRAPHY
Anesthesia: Moderate Sedation | Site: Leg Lower | Laterality: Right

## 2019-10-20 MED ORDER — LABETALOL HCL 5 MG/ML IV SOLN: 10.0000 mg | INTRAVENOUS | Status: DC | PRN

## 2019-10-20 MED ORDER — CLOPIDOGREL BISULFATE 75 MG PO TABS: 75.0000 mg | ORAL_TABLET | Freq: Every day | ORAL | 11 refills | Status: DC

## 2019-10-20 MED ORDER — DIPHENHYDRAMINE HCL 50 MG/ML IJ SOLN: 50.0000 mg | Freq: Once | INTRAMUSCULAR | Status: DC | PRN

## 2019-10-20 MED ORDER — MIDAZOLAM HCL 2 MG/2ML IJ SOLN
INTRAMUSCULAR | Status: DC | PRN
  Administered 2019-10-20 (×2): 1 mg via INTRAVENOUS

## 2019-10-20 MED ORDER — ATORVASTATIN CALCIUM 10 MG PO TABS
10.0000 mg | ORAL_TABLET | Freq: Every day | ORAL | 11 refills | Status: DC
Start: 2019-10-20 — End: 2020-04-02

## 2019-10-20 MED ORDER — HEPARIN SODIUM (PORCINE) 1000 UNIT/ML IJ SOLN
INTRAMUSCULAR | Status: DC | PRN
  Administered 2019-10-20: 4000 [IU] via INTRAVENOUS

## 2019-10-20 MED ORDER — ACETAMINOPHEN 325 MG PO TABS: 650.0000 mg | ORAL_TABLET | ORAL | Status: DC | PRN

## 2019-10-20 MED ORDER — SODIUM CHLORIDE 0.9 % IV SOLN: INTRAVENOUS | Status: DC

## 2019-10-20 MED ORDER — DIPHENHYDRAMINE HCL 50 MG/ML IJ SOLN
INTRAMUSCULAR | Status: AC
  Filled 2019-10-20: qty 1

## 2019-10-20 MED ORDER — HYDRALAZINE HCL 20 MG/ML IJ SOLN: 5.0000 mg | INTRAMUSCULAR | Status: DC | PRN

## 2019-10-20 MED ORDER — FENTANYL CITRATE (PF) 100 MCG/2ML IJ SOLN
INTRAMUSCULAR | Status: AC
  Filled 2019-10-20: qty 2

## 2019-10-20 MED ORDER — IODIXANOL 320 MG/ML IV SOLN
INTRAVENOUS | Status: DC | PRN
  Administered 2019-10-20: 70 mL

## 2019-10-20 MED ORDER — CEFAZOLIN SODIUM-DEXTROSE 1-4 GM/50ML-% IV SOLN
1.0000 g | Freq: Once | INTRAVENOUS | Status: AC
  Administered 2019-10-20: 1 g via INTRAVENOUS

## 2019-10-20 MED ORDER — SODIUM CHLORIDE 0.9 % IV BOLUS
250.0000 mL | Freq: Once | INTRAVENOUS | Status: AC
  Administered 2019-10-20: 250 mL via INTRAVENOUS

## 2019-10-20 MED ORDER — DIPHENHYDRAMINE HCL 50 MG/ML IJ SOLN
INTRAMUSCULAR | Status: DC | PRN
  Administered 2019-10-20: 25 mg via INTRAVENOUS

## 2019-10-20 MED ORDER — HYDROMORPHONE HCL 1 MG/ML IJ SOLN: 1.0000 mg | Freq: Once | INTRAMUSCULAR | Status: DC | PRN

## 2019-10-20 MED ORDER — SODIUM CHLORIDE 0.9 % IV SOLN: 250.0000 mL | INTRAVENOUS | Status: DC | PRN

## 2019-10-20 MED ORDER — FAMOTIDINE 20 MG PO TABS: 40.0000 mg | ORAL_TABLET | Freq: Once | ORAL | Status: DC | PRN

## 2019-10-20 MED ORDER — SODIUM CHLORIDE 0.9% FLUSH: 3.0000 mL | Freq: Two times a day (BID) | INTRAVENOUS | Status: DC

## 2019-10-20 MED ORDER — ONDANSETRON HCL 4 MG/2ML IJ SOLN: 4.0000 mg | Freq: Four times a day (QID) | INTRAMUSCULAR | Status: DC | PRN

## 2019-10-20 MED ORDER — CEFAZOLIN SODIUM-DEXTROSE 1-4 GM/50ML-% IV SOLN
INTRAVENOUS | Status: AC
  Filled 2019-10-20: qty 50

## 2019-10-20 MED ORDER — SODIUM CHLORIDE 0.9% FLUSH: 3.0000 mL | INTRAVENOUS | Status: DC | PRN

## 2019-10-20 MED ORDER — METHYLPREDNISOLONE SODIUM SUCC 125 MG IJ SOLR: 125.0000 mg | Freq: Once | INTRAMUSCULAR | Status: DC | PRN

## 2019-10-20 MED ORDER — MIDAZOLAM HCL 2 MG/ML PO SYRP: 8.0000 mg | ORAL_SOLUTION | Freq: Once | ORAL | Status: DC | PRN

## 2019-10-20 MED ORDER — MIDAZOLAM HCL 5 MG/5ML IJ SOLN
INTRAMUSCULAR | Status: AC
  Filled 2019-10-20: qty 5

## 2019-10-20 MED ORDER — FENTANYL CITRATE (PF) 100 MCG/2ML IJ SOLN
INTRAMUSCULAR | Status: DC | PRN
  Administered 2019-10-20 (×3): 25 ug via INTRAVENOUS

## 2019-10-20 MED ORDER — LABETALOL HCL 5 MG/ML IV SOLN
INTRAVENOUS | Status: AC
  Filled 2019-10-20: qty 4

## 2019-10-20 MED ORDER — CEFAZOLIN SODIUM-DEXTROSE 2-4 GM/100ML-% IV SOLN: 2.0000 g | Freq: Once | INTRAVENOUS | Status: DC

## 2019-10-20 MED ORDER — ASPIRIN EC 81 MG PO TBEC: 81.0000 mg | DELAYED_RELEASE_TABLET | Freq: Every day | ORAL | 2 refills | Status: DC

## 2019-10-20 MED ORDER — HEPARIN SODIUM (PORCINE) 1000 UNIT/ML IJ SOLN
INTRAMUSCULAR | Status: AC
  Filled 2019-10-20: qty 1

## 2019-10-20 SURGICAL SUPPLY — 20 items
BALLN LUTONIX 5X150X130 (BALLOONS) ×3
BALLN ULTRVRSE 2X300X150 (BALLOONS) ×2
BALLN ULTRVRSE 2X300X150 OTW (BALLOONS) ×1
BALLN ULTRVRSE 3X220X150 (BALLOONS) ×3
BALLOON LUTONIX 5X150X130 (BALLOONS) IMPLANT
BALLOON ULTRVRSE 2X300X150 OTW (BALLOONS) IMPLANT
BALLOON ULTRVRSE 3X220X150 (BALLOONS) IMPLANT
CATH BEACON 5 .038 100 VERT TP (CATHETERS) ×2 IMPLANT
CATH PIG 70CM (CATHETERS) ×2 IMPLANT
DEVICE PRESTO INFLATION (MISCELLANEOUS) ×2 IMPLANT
DEVICE SAFEGUARD 24CM (GAUZE/BANDAGES/DRESSINGS) ×2 IMPLANT
DEVICE STARCLOSE SE CLOSURE (Vascular Products) ×2 IMPLANT
GLIDEWIRE ADV .035X260CM (WIRE) ×2 IMPLANT
PACK ANGIOGRAPHY (CUSTOM PROCEDURE TRAY) ×2 IMPLANT
SHEATH ANL2 6FRX45 HC (SHEATH) ×2 IMPLANT
SHEATH BRITE TIP 5FRX11 (SHEATH) ×2 IMPLANT
SYR MEDRAD MARK 7 150ML (SYRINGE) ×2 IMPLANT
TUBING CONTRAST HIGH PRESS 72 (TUBING) ×4 IMPLANT
WIRE G V18X300CM (WIRE) ×2 IMPLANT
WIRE J 3MM .035X145CM (WIRE) ×2 IMPLANT

## 2019-10-20 NOTE — OR Nursing (Signed)
Limited interview due to limited hearing and son declines knowledge of meds or history. Reviewed office note.  Right foot wrapped with betadine and it hurts to unwrap so stopped attempt. third toe black. Son reports pus drainage under dressing. Strong foul smell to dressing.

## 2019-10-20 NOTE — H&P (Signed)
[facility] History & Physical Update  The patient was interviewed and re-examined.  The patient's previous History and Physical has been reviewed and is unchanged.  There is no change in the plan of care. We plan to proceed with the scheduled procedure.  Leotis Pain, MD  10/20/2019, 12:41 PM

## 2019-10-20 NOTE — Op Note (Signed)
[facility]  Percutaneous Study/Intervention Procedural Note   Date of Surgery: 10/20/2019  Surgeon(s):,    Assistants:none  Pre-operative Diagnosis: PAD with ulceration RLE  Post-operative diagnosis:  Same  Procedure(s) Performed:             1.  Ultrasound guidance for vascular access left femoral artery             2.  Catheter placement into right common femoral artery from left femoral approach             3.  Aortogram and selective right lower extremity angiogram including selective images of the right peroneal artery and right anterior tibial artery             4.  Percutaneous transluminal angioplasty of proximal right peroneal artery and tibioperoneal trunk with 3 mm diameter by 22 cm length angioplasty balloon             5.   Percutaneous transluminal angioplasty of the right distal SFA and above-knee popliteal artery with 5 mm diameter by 15 cm length Lutonix drug-coated angioplasty balloon  6.  Percutaneous transluminal angioplasty of the right anterior tibial artery with 3 mm diameter angioplasty balloon proximally and 2 mm diameter angioplasty balloon into the foot             7.  StarClose closure device right femoral artery  EBL: 10 cc  Contrast: 70 cc  Fluoro Time: 5.5 minutes  Moderate Conscious Sedation Time: approximately 30 minutes using 2 mg of Versed and 50 mcg of Fentanyl              Indications:  Patient is a 86 y.o.female with a nonhealing ulceration of the right foot and markedly reduced flow. The patient has noninvasive study showing essentially no flow distally with noncompressible vessels. The patient is brought in for angiography for further evaluation and potential treatment.  Due to the limb threatening nature of the situation, angiogram was performed for attempted limb salvage. The patient is aware that if the procedure fails, amputation would be expected.  The patient also understands that even with successful  revascularization, amputation may still be required due to the severity of the situation.  Risks and benefits are discussed and informed consent is obtained.   Procedure:  The patient was identified and appropriate procedural time out was performed.  The patient was then placed supine on the table and prepped and draped in the usual sterile fashion. Moderate conscious sedation was administered during a face to face encounter with the patient throughout the procedure with my supervision of the RN administering medicines and monitoring the patient's vital signs, pulse oximetry, telemetry and mental status throughout from the start of the procedure until the patient was taken to the recovery room. Ultrasound was used to evaluate the left common femoral artery.  It was patent .  A digital ultrasound image was acquired.  A Seldinger needle was used to access the left common femoral artery under direct ultrasound guidance and a permanent image was performed.  A 0.035 J wire was advanced without resistance and a 5Fr sheath was placed.  Pigtail catheter was placed into the aorta and an AP aortogram was performed. This demonstrated normal renal arteries and normal aorta and iliac segments without significant stenosis. I then crossed the aortic bifurcation and advanced to the right femoral head. Selective right lower extremity angiogram was then performed. This demonstrated that the vessels were highly calcified and imaging was  poor due to continuous patient motion.  The right common femoral artery and profunda femoris artery appeared to be patent.  The SFA was calcified but not stenotic until the most distal SFA and proximal popliteal artery where there was a high-grade stenosis of greater than 85%.  The below-knee popliteal artery normalized.  There was an unusual takeoff of the anterior tibial artery that was medial and above the knee.  This then coursed down and had multiple areas of occlusion throughout its course.   The peroneal artery was the other runoff vessel and it had high-grade stenosis in the 80 to 90% range in 2 areas in the first 15 to 20 cm of the vessel.  It was then continuous distally beyond these proximal stenoses.  The posterior tibial artery was not seen and chronically occluded.  It was felt that it was in the patient's best interest to proceed with intervention after these images to avoid a second procedure and a larger amount of contrast and fluoroscopy based off of the findings from the initial angiogram. The patient was systemically heparinized and a 6 [place] Ansell sheath was then placed over the [employer] wire.The tibial vessels were very difficult to discern from initial imaging and we advanced a Kumpe catheter down into the peroneal artery first where selective imaging was performed which showed this was the course of the peroneal artery and had significant proximal disease.  I then placed a V 18 wire down to the ankle.  A 3 mm diameter by 22 cm length angioplasty balloon was inflated to 14 atm for 1 minute in the tibioperoneal trunk and proximal peroneal artery.  Completion imaging showed marked improvement with one area of borderline greater than 40% residual stenosis, but overall significantly improved with much more brisk flow.  I then used a 5 mm diameter by 15 cm length Lutonix drug-coated angioplasty balloon inflated to 8 atm for 1 minute in the distal SFA and above-knee popliteal artery.  Completion imaging showed only about a 15 to 20% residual stenosis after angioplasty.  I then used the Kumpe catheter and the V 18 wire to selectively cannulate the medial origin of the anterior tibial artery and perform selective imaging to help opacify these images distally.  This showed multiple areas of occlusion but tediously I was able to get a wire down into the foot.  I then used a 2 mm diameter by 30 cm length angioplasty balloon from the proximal dorsalis pedis artery up to the proximal to mid  anterior tibial artery.  I 3 mm diameter by 22 cm length angioplasty balloon was inflated to 12 atm for 1 minute in the proximal anterior tibial artery.  There was some spasm distally, but no obvious stenosis of greater than 30% was seen and flow appeared to be significantly improved. I elected to terminate the procedure although our completion images were poor due to patient motion as I did not want to use any additional contrast with her renal failure and her continuous motion was not safe to continue the procedure. The sheath was removed and StarClose closure device was deployed in the left femoral artery with excellent hemostatic result. The patient was taken to the recovery room in stable condition having tolerated the procedure well.  Findings:               Aortogram:  Renal arteries appeared patent.  The aorta and iliac arteries are calcific but not stenotic.  Right lower Extremity:  Vessels were highly calcified and imaging was poor due to continuous patient motion.  The right common femoral artery and profunda femoris artery appeared to be patent.  The SFA was calcified but not stenotic until the most distal SFA and proximal popliteal artery where there was a high-grade stenosis of greater than 85%.  The below-knee popliteal artery normalized.  There was an unusual takeoff of the anterior tibial artery that was medial and above the knee.  This then coursed down and had multiple areas of occlusion throughout its course.  The peroneal artery was the other runoff vessel and it had high-grade stenosis in the 80 to 90% range in 2 areas in the first 15 to 20 cm of the vessel.  It was then continuous distally beyond these proximal stenoses.  The posterior tibial artery was not seen and chronically occluded.   Disposition: Patient was taken to the recovery room in stable condition having tolerated the procedure well.  Complications: None  Leotis Pain 10/20/2019 2:05 PM   This note was  created with Dragon Medical transcription system. Any errors in dictation are purely unintentional.

## 2019-10-20 NOTE — Discharge Instructions (Signed)
Angiogram, Care After This sheet gives you information about how to care for yourself after your procedure. Your health care provider may also give you more specific instructions. If you have problems or questions, contact your health care provider. What can I expect after the procedure? After the procedure, it is common to have bruising and tenderness at the catheter insertion area. Follow these instructions at home: Insertion site care  Follow instructions from your health care provider about how to take care of your insertion site. Make sure you: ? Wash your hands with soap and water before you change your bandage (dressing). If soap and water are not available, use hand sanitizer. ? Change your dressing as told by your health care provider. ? Leave stitches (sutures), skin glue, or adhesive strips in place. These skin closures may need to stay in place for 2 weeks or longer. If adhesive strip edges start to loosen and curl up, you may trim the loose edges. Do not remove adhesive strips completely unless your health care provider tells you to do that.  Do not take baths, swim, or use a hot tub until your health care provider approves.  You may shower 24-48 hours after the procedure or as told by your health care provider. ? Gently wash the site with plain soap and water. ? Pat the area dry with a clean towel. ? Do not rub the site. This may cause bleeding.  Do not apply powder or lotion to the site. Keep the site clean and dry.  Check your insertion site every day for signs of infection. Check for: ? Redness, swelling, or pain. ? Fluid or blood. ? Warmth. ? Pus or a bad smell. Activity  Rest as told by your health care provider, usually for 1-2 days.  Do not lift anything that is heavier than 10 lbs. (4.5 kg) or as told by your health care provider.  Do not drive for 24 hours if you were given a medicine to help you relax (sedative).  Do not drive or use heavy machinery while  taking prescription pain medicine. General instructions   Return to your normal activities as told by your health care provider, usually in about a week. Ask your health care provider what activities are safe for you.  If the catheter site starts bleeding, lie flat and put pressure on the site. If the bleeding does not stop, get help right away. This is a medical emergency.  Drink enough fluid to keep your urine clear or pale yellow. This helps flush the contrast dye from your body.  Take over-the-counter and prescription medicines only as told by your health care provider.  Keep all follow-up visits as told by your health care provider. This is important. Contact a health care provider if:  You have a fever or chills.  You have redness, swelling, or pain around your insertion site.  You have fluid or blood coming from your insertion site.  The insertion site feels warm to the touch.  You have pus or a bad smell coming from your insertion site.  You have bruising around the insertion site.  You notice blood collecting in the tissue around the catheter site (hematoma). The hematoma may be painful to the touch. Get help right away if:  You have severe pain at the catheter insertion area.  The catheter insertion area swells very fast.  The catheter insertion area is bleeding, and the bleeding does not stop when you hold steady pressure on the area.    The area near or just beyond the catheter insertion site becomes pale, cool, tingly, or numb. These symptoms may represent a serious problem that is an emergency. Do not wait to see if the symptoms will go away. Get medical help right away. Call your local emergency services (911 in the [place] Do not drive yourself to the hospital. Summary  After the procedure, it is common to have bruising and tenderness at the catheter insertion area.  After the procedure, it is important to rest and drink plenty of fluids.  Do not take baths,  swim, or use a hot tub until your health care provider says it is okay to do so. You may shower 24-48 hours after the procedure or as told by your health care provider.  If the catheter site starts bleeding, lie flat and put pressure on the site. If the bleeding does not stop, get help right away. This is a medical emergency. This information is not intended to replace advice given to you by your health care provider. Make sure you discuss any questions you have with your health care provider. Document Revised: 04/13/2017 Document Reviewed: 04/05/2016 Elsevier Patient Education  2020 [employer]  

## 2019-10-20 NOTE — OR Nursing (Signed)
Dr dew notified of lab results

## 2019-10-21 ENCOUNTER — Encounter: Payer: Self-pay | Admitting: Cardiology

## 2019-10-22 ENCOUNTER — Other Ambulatory Visit: Payer: Self-pay

## 2019-10-22 ENCOUNTER — Encounter: Payer: Medicare Other | Attending: Internal Medicine | Admitting: Internal Medicine

## 2019-10-22 DIAGNOSIS — I70261 Atherosclerosis of native arteries of extremities with gangrene, right leg: Secondary | ICD-10-CM | POA: Insufficient documentation

## 2019-10-22 DIAGNOSIS — L97513 Non-pressure chronic ulcer of other part of right foot with necrosis of muscle: Secondary | ICD-10-CM | POA: Insufficient documentation

## 2019-10-22 NOTE — Progress Notes (Signed)
[name] ([id number]) Visit Report for 10/22/2019 HPI Details Patient Name: [name], [name]. Date of Service: 10/22/2019 10:30 AM Medical Record Number: [id number] Patient Account Number: [id number] Date of Birth/Sex: Oct 05, 1920 (86 y.o. F) Treating RN: Cornell Barman Primary Care Provider: Pernell Dupre Other Clinician: Referring Provider: Pernell Dupre Treating Provider/Extender: Tito Dine in Treatment: 2 History of Present Illness HPI Description: 86 year old female who can provide very little history, accompanied by daughter-in-law with whom she stays who provides relevant history-patient was noted to have a right first webspace ulceration about 4 weeks ago and thereafter was taken to podiatry where she was seen on 09/23/19 and had excisional debridement of eschar with 5 x 3 x 0.3 cm wound on the dorsum of the first webspace extending proximally. Patient was then referred to [facility] readings suggest bilateral peripheral arterial disease, ABIs were noncompressible on both sides, from mid SFA onward monophasic flow was documented on both sides, with no PTA flow detectable on either side. Since the time that patient had the podiatry appointment over the past 2 weeks patient's daughter-in-law has been using Betadine soaks to the wounds, with the home health coming once a week. Patient has significant pain mostly at night according to daughter-in-law, was started on tramadol and then hydrocodone by her PCP and continues to have discomfort. She is able to ambulate carefully. No other symptoms reported such as chest pain or shortness of breath or dizziness. Patient's history of hypertension with no other prior medical problems no history of strokes or heart attacks or diabetes 10/22/2019; this is a frail elderly woman who was admitted to the clinic 2 weeks ago. She has very difficult wounds predominantly between the first and second toe webspace on the right. She was found  to have severe PAD. She was referred to Dr. Lucky Cowboy and on 10/20/2019 she had an angiogram. She was found to have significant PAD and underwent angioplasty of the proximal right peroneal artery and tibioperoneal trunk. Angioplasty of the right distal SFA and above-knee popliteal and angioplasty of the right anterior tibial artery. Her wounds are extensively in the right distal foot including the right second toe, and the right first and second toe webspace and in the proximal foot distal to the first and second toes. There is a significant amount of pain. At this point I do not know how much if any improvement there has been after her procedure Electronic Signature(s) Signed: 10/22/2019 4:11:49 PM By: Linton Ham MD Entered By: Linton Ham on 10/22/2019 11:52:32 [name] ([id number]) -------------------------------------------------------------------------------- Physical Exam Details Patient Name: [name]. Date of Service: 10/22/2019 10:30 AM Medical Record Number: [id number] Patient Account Number: [id number] Date of Birth/Sex: 25-Oct-1920 (86 y.o. F) Treating RN: Cornell Barman Primary Care Provider: Pernell Dupre Other Clinician: Referring Provider: Pernell Dupre Treating Provider/Extender: Tito Dine in Treatment: 2 Constitutional Sitting or standing Blood Pressure is within target range for patient.. Pulse regular and within target range for patient.Marland Kitchen Respirations regular, non- labored and within target range.. Temperature is normal and within the target range for the patient.Marland Kitchen appears in no distress. Cardiovascular I believe her right femoral and popliteal pulses are faint but palpable.. Nothing present in the right foot. Notes Wound exam; the area is in the medial right foot dorsally. Between the first and second toes there is a large open necrotic wound. Very painful. I think the right second toe is mummified. Just proximal to the first and second toes  there is denuded dead skin  which is peeling off. There is an odor present. Electronic Signature(s) Signed: 10/22/2019 4:11:49 PM By: Linton Ham MD Entered By: Linton Ham on 10/22/2019 11:54:06 [name], [name] ([id number]) -------------------------------------------------------------------------------- Physician Orders Details Patient Name: [name] Date of Service: 10/22/2019 10:30 AM Medical Record Number: [id number] Patient Account Number: [id number] Date of Birth/Sex: 1920/08/03 (86 y.o. F) Treating RN: Montey Hora Primary Care Provider: Pernell Dupre Other Clinician: Referring Provider: Pernell Dupre Treating Provider/Extender: Tito Dine in Treatment: 2 Verbal / Phone Orders: No Diagnosis Coding Primary Wound Dressing Wound #1 Right Toe - Web between 1st and 2nd o Silver Alginate Wound #2 Right,Dorsal Toe Second o Other: - Betadine Wound #3 Right,Dorsal Foot o Silver Alginate Secondary Dressing Wound #1 Right Toe - Web between 1st and 2nd o Conform/Kerlix Wound #2 Right,Dorsal Toe Second o Conform/Kerlix Wound #3 Right,Dorsal Foot o Conform/Kerlix Dressing Change Frequency Wound #1 Right Toe - Web between 1st and 2nd o Three times weekly Wound #2 Right,Dorsal Toe Second o Three times weekly Wound #3 Right,Dorsal Foot o Three times weekly Follow-up Appointments Wound #1 Right Toe - Web between 1st and 2nd o Return Appointment in 1 week. Wound #2 Right,Dorsal Toe Second o Return Appointment in 1 week. Wound #3 Right,Dorsal Foot o Return Appointment in 1 week. Home Health Wound #1 Right Toe - Web between 1st and 2nd o [place] Visits - [facility]: Monday and Thursday visits. o Home Health Nurse may visit PRN to address patientos wound care needs. o FACE TO FACE ENCOUNTER: MEDICARE and MEDICAID PATIENTS: I certify that this patient is under my care and that I had a face-to-face encounter  that meets the physician face-to-face encounter requirements with this patient on this date. The encounter with the patient was in whole or in part for the following MEDICAL CONDITION: (primary reason for [place]) MEDICAL NECESSITY: I certify, that based on my findings, NURSING services are a medically necessary home health service. HOME BOUND STATUS: I certify that my clinical findings support that this patient is homebound (i.e., Due to illness or injury, pt requires aid of supportive devices such as crutches, cane, wheelchairs, walkers, the use of special transportation or the assistance of another person to leave their place of residence. There is a normal inability to leave the home and doing so requires considerable and taxing effort. Other absences are for medical reasons / religious services and are infrequent or of short duration when for other reasons). o If current dressing causes regression in wound condition, may D/C ordered dressing product/s and apply Normal Saline Moist Dressing daily until next [place] / Other MD appointment. [place] of regression in wound condition at [phone]. o Please direct any NON-WOUND related issues/requests for orders to patient's Primary Care Physician [name], [name] ([id number]) Wound #2 [place] Visits - [facility]: Monday and Thursday visits. o Home Health Nurse may visit PRN to address patientos wound care needs. o FACE TO FACE ENCOUNTER: MEDICARE and MEDICAID PATIENTS: I certify that this patient is under my care and that I had a face-to-face encounter that meets the physician face-to-face encounter requirements with this patient on this date. The encounter with the patient was in whole or in part for the following MEDICAL CONDITION: (primary reason for [place]) MEDICAL NECESSITY: I certify, that based on my findings, NURSING services are a medically  necessary home health service. HOME BOUND STATUS: I certify that my clinical findings support  that this patient is homebound (i.e., Due to illness or injury, pt requires aid of supportive devices such as crutches, cane, wheelchairs, walkers, the use of special transportation or the assistance of another person to leave their place of residence. There is a normal inability to leave the home and doing so requires considerable and taxing effort. Other absences are for medical reasons / religious services and are infrequent or of short duration when for other reasons). o If current dressing causes regression in wound condition, may D/C ordered dressing product/s and apply Normal Saline Moist Dressing daily until next [place] / Other MD appointment. [place] of regression in wound condition at [phone]. o Please direct any NON-WOUND related issues/requests for orders to patient's Primary Care Physician Wound #3 [place] Visits - [facility]: Monday and Thursday visits. o Home Health Nurse may visit PRN to address patientos wound care needs. o FACE TO FACE ENCOUNTER: MEDICARE and MEDICAID PATIENTS: I certify that this patient is under my care and that I had a face-to-face encounter that meets the physician face-to-face encounter requirements with this patient on this date. The encounter with the patient was in whole or in part for the following MEDICAL CONDITION: (primary reason for [place]) MEDICAL NECESSITY: I certify, that based on my findings, NURSING services are a medically necessary home health service. HOME BOUND STATUS: I certify that my clinical findings support that this patient is homebound (i.e., Due to illness or injury, pt requires aid of supportive devices such as crutches, cane, wheelchairs, walkers, the use of special transportation or the assistance of another person to leave their place of residence.  There is a normal inability to leave the home and doing so requires considerable and taxing effort. Other absences are for medical reasons / religious services and are infrequent or of short duration when for other reasons). o If current dressing causes regression in wound condition, may D/C ordered dressing product/s and apply Normal Saline Moist Dressing daily until next [place] / Other MD appointment. [place] of regression in wound condition at [phone]. o Please direct any NON-WOUND related issues/requests for orders to patient's Primary Care Physician Electronic Signature(s) Signed: 10/22/2019 4:11:49 PM By: Linton Ham MD Signed: 10/22/2019 4:13:29 PM By: Montey Hora Entered By: Montey Hora on 10/22/2019 11:12:57 [place], [name] ([id number]) -------------------------------------------------------------------------------- Problem List Details Patient Name: [place], [name]. Date of Service: 10/22/2019 10:30 AM Medical Record Number: [id number] Patient Account Number: [id number] Date of Birth/Sex: [birth year] (86 y.o. 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 Date of Service: 10/22/2019 10:30 AM Medical Record Number:  [id number] Patient Account Number: [id number] Date of Birth/Sex: 05/19/1920 (86 y.o. F) Treating RN: Cornell Barman Primary Care Provider: Pernell Dupre Other Clinician: Referring Provider: Pernell Dupre Treating Provider/Extender: Tito Dine in Treatment: 2 Subjective History of Present Illness (HPI) 86 year old female who can provide very little history, accompanied by daughter-in-law with whom she stays who provides  relevant history- patient was noted to have a right first webspace ulceration about 4 weeks ago and thereafter was taken to podiatry where she was seen on 09/23/19 and had excisional debridement of eschar with 5 x 3 x 0.3 cm wound on the dorsum of the first webspace extending proximally. Patient was then referred to [facility] readings suggest bilateral peripheral arterial disease, ABIs were noncompressible on both sides, from mid SFA onward monophasic flow was documented on both sides, with no PTA flow detectable on either side. Since the time that patient had the podiatry appointment over the past 2 weeks patient's daughter-in-law has been using Betadine soaks to the wounds, with the home health coming once a week. Patient has significant pain mostly at night according to daughter-in-law, was started on tramadol and then hydrocodone by her PCP and continues to have discomfort. She is able to ambulate carefully. No other symptoms reported such as chest pain or shortness of breath or dizziness. Patient's history of hypertension with no other prior medical problems no history of strokes or heart attacks or diabetes 10/22/2019; this is a frail elderly woman who was admitted to the clinic 2 weeks ago. She has very difficult wounds predominantly between the first and second toe webspace on the right. She was found to have severe PAD. She was referred to Dr. Lucky Cowboy and on 10/20/2019 she had an angiogram. She was found to have significant PAD and underwent angioplasty of  the proximal right peroneal artery and tibioperoneal trunk. Angioplasty of the right distal SFA and above-knee popliteal and angioplasty of the right anterior tibial artery. Her wounds are extensively in the right distal foot including the right second toe, and the right first and second toe webspace and in the proximal foot distal to the first and second toes. There is a significant amount of pain. At this point I do not know how much if any improvement there has been after her procedure Objective Constitutional Sitting or standing Blood Pressure is within target range for patient.. Pulse regular and within target range for patient.Marland Kitchen Respirations regular, non- labored and within target range.. Temperature is normal and within the target range for the patient.Marland Kitchen appears in no distress. Vitals Time Taken: 10:36 AM, Height: 65 in, Weight: 126 lbs, BMI: 21, Temperature: 98.2 F, Pulse: 64 bpm, Respiratory Rate: 16 breaths/min, Blood Pressure: 113/46 mmHg. Cardiovascular I believe her right femoral and popliteal pulses are faint but palpable.. Nothing present in the right foot. General Notes: Wound exam; the area is in the medial right foot dorsally. Between the first and second toes there is a large open necrotic wound. Very painful. I think the right second toe is mummified. Just proximal to the first and second toes there is denuded dead skin which is peeling off. There is an odor present. Integumentary (Hair, Skin) Wound #1 status is Open. Original cause of wound was Gradually Appeared. The wound is located on the Right Toe - Web between 1st and 2nd. The wound measures 4cm length x 1.5cm width x 0.2cm depth; 4.712cm^2 area and 0.942cm^3 volume. There is Fat Layer (Subcutaneous Tissue) Exposed exposed. There is a medium amount of serosanguineous drainage noted. There is small (1-33%) pink granulation within the wound bed. There is a large (67-100%) amount of necrotic tissue within the wound bed  including Eschar and Adherent Slough. Wound #2 status is Open. Original cause of wound was Gradually Appeared. The wound is located on the Right,Dorsal Toe Second. The wound measures 2cm length x 0.8cm width  x 0.1cm depth; 1.257cm^2 area and 0.126cm^3 volume. There is Fat Layer (Subcutaneous Tissue) Exposed exposed. There is a medium amount of serosanguineous drainage noted. There is no granulation within the wound bed. There is a large (67- 100%) amount of necrotic tissue within the wound bed including Eschar and Adherent Slough. Wound #3 status is Open. Original cause of wound was Gradually Appeared. The wound is located on the Right,Dorsal Foot. The wound measures 0.1cm length x 0.1cm width x 0.1cm depth; 0.008cm^2 area and 0.001cm^3 volume. There is Fat Layer (Subcutaneous Tissue) Exposed exposed. There is a medium amount of serosanguineous drainage noted. There is large (67-100%) red granulation within the wound bed. There is a small (1-33%) amount of necrotic tissue within the wound bed. [place], [name] ([id number]) Assessment Active Problems ICD-10 Non-pressure chronic ulcer of other part of right foot with necrosis of muscle Peripheral vascular disease, unspecified Plan Primary Wound Dressing: Wound #1 Right Toe - Web between 1st and 2nd: Silver Alginate Wound #2 Right,Dorsal Toe Second: Other: - Betadine Wound #3 Right,Dorsal Foot: Silver Alginate Secondary Dressing: Wound #1 Right Toe - Web between 1st and 2nd: Conform/Kerlix Wound #2 Right,Dorsal Toe Second: Conform/Kerlix Wound #3 Right,Dorsal Foot: Conform/Kerlix Dressing Change Frequency: Wound #1 Right Toe - Web between 1st and 2nd: Three times weekly Wound #2 Right,Dorsal Toe Second: Three times weekly Wound #3 Right,Dorsal Foot: Three times weekly Follow-up Appointments: Wound #1 Right Toe - Web between 1st and 2nd: Return Appointment in 1 week. Wound #2 Right,Dorsal Toe Second: Return Appointment in 1  week. Wound #3 Right,Dorsal Foot: Return Appointment in 1 week. Home Health: Wound #1 Right Toe - Web between 1st and 2nd: [place] Visits - [facility]: Monday and Thursday visits. Home Health Nurse may visit PRN to address patient s wound care needs. FACE TO FACE ENCOUNTER: MEDICARE and MEDICAID PATIENTS: I certify that this patient is under my care and that I had a face-to-face encounter that meets the physician face-to-face encounter requirements with this patient on this date. The encounter with the patient was in whole or in part for the following MEDICAL CONDITION: (primary reason for [place]) MEDICAL NECESSITY: I certify, that based on my findings, NURSING services are a medically necessary home health service. HOME BOUND STATUS: I certify that my clinical findings support that this patient is homebound (i.e., Due to illness or injury, pt requires aid of supportive devices such as crutches, cane, wheelchairs, walkers, the use of special transportation or the assistance of another person to leave their place of residence. There is a normal inability to leave the home and doing so requires considerable and taxing effort. Other absences are for medical reasons / religious services and are infrequent or of short duration when for other reasons). If current dressing causes regression in wound condition, may D/C ordered dressing product/s and apply Normal Saline Moist Dressing daily until next [place] / Other MD appointment. [place] of regression in wound condition at [phone]. Please direct any NON-WOUND related issues/requests for orders to patient's Primary Care Physician Wound #2 Right,Dorsal Toe Second: [place] Visits - [facility]: Monday and Thursday visits. Home Health Nurse may visit PRN to address patient s wound care needs. FACE TO FACE ENCOUNTER: MEDICARE and MEDICAID PATIENTS: I certify that this patient is under my  care and that I had a face-to-face encounter that meets the physician face-to-face encounter requirements with this patient on this date. The encounter with the patient was in whole  or in part for the following MEDICAL CONDITION: (primary reason for Home Healthcare) MEDICAL NECESSITY: I certify, that based on my findings, NURSING services are a medically necessary home health service. HOME BOUND STATUS: I certify that my clinical findings support that this patient is homebound (i.e., Due to illness or injury, pt requires aid of supportive devices such as crutches, cane, wheelchairs, walkers, the use of special transportation or the assistance of another person to leave their place of residence. There is a normal inability to leave the home and doing so requires considerable and taxing effort. Other absences are for medical reasons / religious services and are infrequent or of short duration when for other reasons). If current dressing causes regression in wound condition, may D/C ordered dressing product/s and apply Normal Saline Moist Dressing daily until next [place] / Other MD appointment. [place] of regression in wound condition at [phone]. Please direct any NON-WOUND related issues/requests for orders to patient's Primary Care Physician Wound #3 Right,Dorsal Foot: [place] Visits - [facility]: Monday and Thursday visits. Home Health Nurse may visit PRN to address patient s wound care needs. [place], [place] ([id number]) FACE TO FACE ENCOUNTER: MEDICARE and MEDICAID PATIENTS: I certify that this patient is under my care and that I had a face-to-face encounter that meets the physician face-to-face encounter requirements with this patient on this date. The encounter with the patient was in whole or in part for the following MEDICAL CONDITION: (primary reason for [place]) MEDICAL NECESSITY: I certify, that based on my findings, NURSING services  are a medically necessary home health service. HOME BOUND STATUS: I certify that my clinical findings support that this patient is homebound (i.e., Due to illness or injury, pt requires aid of supportive devices such as crutches, cane, wheelchairs, walkers, the use of special transportation or the assistance of another person to leave their place of residence. There is a normal inability to leave the home and doing so requires considerable and taxing effort. Other absences are for medical reasons / religious services and are infrequent or of short duration when for other reasons). If current dressing causes regression in wound condition, may D/C ordered dressing product/s and apply Normal Saline Moist Dressing daily until next [place] / Other MD appointment. [place] of regression in wound condition at [phone]. Please direct any NON-WOUND related issues/requests for orders to patient's Primary Care Physician 1. We are going to use silver alginate between the first and second toes this area is necrotic. Far too painful to consider a debridement 2. The right second toe is dry gangrenous changes we will continue the Betadine. 3. Silver alginate to the dorsal foot 4. At this point it is really too early to say whether revascularization has made anything more viable and I really could not determine whether there is been any improvement in her pain. Very concerning that this area is not going to be salvageable however 5. Very difficult situation for this poor patient however I think at some point it is possible she will end up with a below-knee amputation Electronic Signature(s) Signed: 10/22/2019 4:11:49 PM By: Linton Ham MD Entered By: Linton Ham on 10/22/2019 11:56:29 [place], [name] ([id number]) -------------------------------------------------------------------------------- SuperBill Details Patient Name: [place], [name]. Date of Service:  10/22/2019 Medical Record Number: [id number] Patient Account Number: [id number] Date of Birth/Sex: 01/04/1921 (86 y.o. 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, right leg Facility Procedures CPT4 Code: [id number] Description: 99214 - WOUND CARE VISIT-LEV 4 EST PT Modifier: Quantity: 1 Physician Procedures CPT4 Code: [id number] Description: 84037 - WC PHYS LEVEL 4 - EST PT Modifier: Quantity: 1 CPT4 Code: Description: ICD-10 Diagnosis Description L97.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

## 2019-10-22 NOTE — Progress Notes (Addendum)
FREDDA, CLARIDA ([id number]) Visit Report for 10/22/2019 Arrival Information Details Patient Name: [name], [name]. Date of Service: 10/22/2019 10:30 AM Medical Record Number: [id number] Patient Account Number: [id number] Date of Birth/Sex: [birth year] (86 y.o. F) Treating RN: Army Melia Primary Care Lizette Pazos: Pernell Dupre Other Clinician: Referring Wyoma Genson: Pernell Dupre Treating Paula Busenbark/Extender: Tito Dine in Treatment: 2 Visit Information History Since Last Visit Added or deleted any medications: No Patient Arrived: Wheel Chair Any new allergies or adverse reactions: No Arrival Time: 10:35 Had a fall or experienced change in No Accompanied By: family activities of daily living that may affect Transfer Assistance: None risk of falls: Patient Identification Verified: Yes Signs or symptoms of abuse/neglect since last visito No Hospitalized since last visit: No Has Dressing in Place as Prescribed: Yes Pain Present Now: No Electronic Signature(s) Signed: 10/22/2019 10:50:28 AM By: Army Melia Entered By: Army Melia on 10/22/2019 10:35:58 [place], Charlynne Cousins ([id number]) -------------------------------------------------------------------------------- Clinic Level of Care Assessment Details Patient Name: [place], Charlynne Cousins. Date of Service: 10/22/2019 10:30 AM Medical Record Number: [id number] Patient Account Number: [id number] Date of Birth/Sex: 07-23-1920 (86 y.o. F) Treating RN: Montey Hora Primary Care Adams Hinch: Pernell Dupre Other Clinician: Referring Schylar Wuebker: Pernell Dupre Treating Devri Kreher/Extender: Tito Dine in Treatment: 2 Clinic Level of Care Assessment Items TOOL 4 Quantity Score []  - Use when only an EandM is performed on FOLLOW-UP visit 0 ASSESSMENTS - Nursing Assessment / Reassessment X - Reassessment of Co-morbidities (includes updates in patient status) 1 10 X- 1 5 Reassessment of Adherence to Treatment Plan ASSESSMENTS -  Wound and Skin Assessment / Reassessment []  - Simple Wound Assessment / Reassessment - one wound 0 X- 3 5 Complex Wound Assessment / Reassessment - multiple wounds []  - 0 Dermatologic / Skin Assessment (not related to wound area) ASSESSMENTS - Focused Assessment []  - Circumferential Edema Measurements - multi extremities 0 []  - 0 Nutritional Assessment / Counseling / Intervention X- 1 5 Lower Extremity Assessment (monofilament, tuning fork, pulses) []  - 0 Peripheral Arterial Disease Assessment (using hand held doppler) ASSESSMENTS - Ostomy and/or Continence Assessment and Care []  - Incontinence Assessment and Management 0 []  - 0 Ostomy Care Assessment and Management (repouching, etc.) PROCESS - Coordination of Care X - Simple Patient / Family Education for ongoing care 1 15 []  - 0 Complex (extensive) Patient / Family Education for ongoing care X- 1 10 Staff obtains Programmer, systems, Records, Test Results / Process Orders []  - 0 Staff telephones HHA, Nursing Homes / Clarify orders / etc []  - 0 Routine Transfer to another Facility (non-emergent condition) []  - 0 Routine Hospital Admission (non-emergent condition) []  - 0 New Admissions / Biomedical engineer / Ordering NPWT, Apligraf, etc. []  - 0 Emergency Hospital Admission (emergent condition) X- 1 10 Simple Discharge Coordination []  - 0 Complex (extensive) Discharge Coordination PROCESS - Special Needs []  - Pediatric / Minor Patient Management 0 []  - 0 Isolation Patient Management []  - 0 Hearing / Language / Visual special needs []  - 0 Assessment of Community assistance (transportation, D/C planning, etc.) []  - 0 Additional assistance / Altered mentation []  - 0 Support Surface(s) Assessment (bed, cushion, seat, etc.) INTERVENTIONS - Wound Cleansing / Measurement Demeo, Tanesha J. ([id number]) []  - 0 Simple Wound Cleansing - one wound X- 3 5 Complex Wound Cleansing - multiple wounds X- 1 5 Wound Imaging (photographs -  any number of wounds) []  - 0 Wound Tracing (instead of photographs) []  - 0 Simple Wound Measurement - one wound X- 3  5 Complex Wound Measurement - multiple wounds INTERVENTIONS - Wound Dressings []  - Small Wound Dressing one or multiple wounds 0 X- 1 15 Medium Wound Dressing one or multiple wounds []  - 0 Large Wound Dressing one or multiple wounds []  - 0 Application of Medications - topical []  - 0 Application of Medications - injection INTERVENTIONS - Miscellaneous []  - External ear exam 0 []  - 0 Specimen Collection (cultures, biopsies, blood, body fluids, etc.) []  - 0 Specimen(s) / Culture(s) sent or taken to Lab for analysis []  - 0 Patient Transfer (multiple staff / Civil Service fast streamer / Similar devices) []  - 0 Simple Staple / Suture removal (25 or less) []  - 0 Complex Staple / Suture removal (26 or more) []  - 0 Hypo / Hyperglycemic Management (close monitor of Blood Glucose) []  - 0 Ankle / Brachial Index (ABI) - do not check if billed separately X- 1 5 Vital Signs Has the patient been seen at the hospital within the last three years: Yes Total Score: 125 Level Of Care: New/Established - Level 4 Electronic Signature(s) Signed: 10/22/2019 4:13:29 PM By: Montey Hora Entered By: Montey Hora on 10/22/2019 11:13:28 [place], Charlynne Cousins ([id number]) -------------------------------------------------------------------------------- Encounter Discharge Information Details Patient Name: [place], Charlynne Cousins. Date of Service: 10/22/2019 10:30 AM Medical Record Number: [id number] Patient Account Number: [id number] Date of Birth/Sex: [birth year] (86 y.o. F) Treating RN: Montey Hora Primary Care Keiera Strathman: Pernell Dupre Other Clinician: Referring Giuliano Preece: Pernell Dupre Treating Elvi Leventhal/Extender: Tito Dine in Treatment: 2 Encounter Discharge Information Items Discharge Condition: Stable Ambulatory Status: Wheelchair Discharge Destination: Home Transportation: Private  Auto Accompanied By: son Schedule Follow-up Appointment: Yes Clinical Summary of Care: Electronic Signature(s) Signed: 10/22/2019 4:13:29 PM By: Montey Hora Entered By: Montey Hora on 10/22/2019 11:14:47 [place], Charlynne Cousins ([id number]) -------------------------------------------------------------------------------- Lower Extremity Assessment Details Patient Name: [place], Charlynne Cousins. Date of Service: 10/22/2019 10:30 AM Medical Record Number: [id number] Patient Account Number: [id number] Date of Birth/Sex: Dec 05, 1920 (86 y.o. F) Treating RN: Army Melia Primary Care Jerri Hargadon: Pernell Dupre Other Clinician: Referring Lavi Sheehan: Pernell Dupre Treating Rohini Jaroszewski/Extender: Tito Dine in Treatment: 2 Edema Assessment Assessed: [Left: No] [Right: No] Edema: [Left: N] [Right: o] Vascular Assessment Pulses: Dorsalis Pedis Palpable: [Right:Yes] Electronic Signature(s) Signed: 10/22/2019 10:50:28 AM By: Army Melia Entered By: Army Melia on 10/22/2019 10:42:19 [place], Charlynne Cousins ([id number]) -------------------------------------------------------------------------------- Multi Wound Chart Details Patient Name: [place], Charlynne Cousins. Date of Service: 10/22/2019 10:30 AM Medical Record Number: [id number] Patient Account Number: [id number] Date of Birth/Sex: [birth year] (86 y.o. F) Treating RN: Montey Hora Primary Care Abrey Bradway: Pernell Dupre Other Clinician: Referring Thang Flett: Pernell Dupre Treating Placido Hangartner/Extender: Tito Dine in Treatment: 2 Vital Signs Height(in): 42 Pulse(bpm): 74 Weight(lbs): 126 Blood Pressure(mmHg): 113/46 Body Mass Index(BMI): 21 Temperature(F): 98.2 Respiratory Rate(breaths/min): 16 Photos: Wound Location: Right Toe - Web between 1st and Right, Dorsal Toe Second Right, Dorsal Foot 2nd Wounding Event: Gradually Appeared Gradually Appeared Gradually Appeared Primary Etiology: Arterial Insufficiency Ulcer Arterial Insufficiency  Ulcer Arterial Insufficiency Ulcer Comorbid History: Anemia, Hypertension, Gout, Anemia, Hypertension, Gout, Anemia, Hypertension, Gout, Osteoarthritis Osteoarthritis Osteoarthritis Date Acquired: 09/08/2019 09/08/2019 09/08/2019 Weeks of Treatment: 2 2 2  Wound Status: Open Open Open Measurements L x W x D (cm) 4x1.5x0.2 2x0.8x0.1 0.1x0.1x0.1 Area (cm) : 4.712 1.257 0.008 Volume (cm) : 0.942 0.126 0.001 % Reduction in Area: 21.10% -60.10% 99.80% % Reduction in Volume: -57.80% -59.50% 99.80% Classification: Full Thickness Without Exposed Full Thickness Without Exposed Full Thickness Without Exposed Support Structures Support Structures Support Structures Exudate Amount: Medium Medium  Medium Exudate Type: Serosanguineous Serosanguineous Serosanguineous Exudate Color: red, brown red, brown red, brown Granulation Amount: Small (1-33%) None Present (0%) Large (67-100%) Granulation Quality: Pink N/A Red Necrotic Amount: Large (67-100%) Large (67-100%) Small (1-33%) Necrotic Tissue: Eschar, Adherent Slough Eschar, Adherent Slough N/A Exposed Structures: Fat Layer (Subcutaneous Tissue) Fat Layer (Subcutaneous Tissue) Fat Layer (Subcutaneous Tissue) Exposed: Yes Exposed: Yes Exposed: Yes Fascia: No Fascia: No Fascia: No Tendon: No Tendon: No Tendon: No Muscle: No Muscle: No Muscle: No Joint: No Joint: No Joint: No Bone: No Bone: No Bone: No Epithelialization: None None None Treatment Notes Wound #1 (Right Toe - Web between 1st and 2nd) Notes betadine to 2nd toe, silvercel to others with conform Wound #2 (Right, Dorsal Toe Second) [place], Charlynne Cousins ([id number]) Notes betadine to 2nd toe, silvercel to others with conform Wound #3 (Right, Dorsal Foot) Notes betadine to 2nd toe, silvercel to others with conform Electronic Signature(s) Signed: 10/22/2019 4:11:49 PM By: Linton Ham MD Entered By: Linton Ham on 10/22/2019 11:49:43 [place], Charlynne Cousins  ([id number]) -------------------------------------------------------------------------------- Multi-Disciplinary Care Plan Details Patient Name: [employer], Charlynne Cousins. Date of Service: 10/22/2019 10:30 AM Medical Record Number: [id number] Patient Account Number: [id number] Date of Birth/Sex: 12/19/1920 (86 y.o. F) Treating RN: Montey Hora Primary Care Pinchos Topel: Pernell Dupre Other Clinician: Referring Kelvon Giannini: Pernell Dupre Treating Latrease Kunde/Extender: Tito Dine in Treatment: 2 Active Inactive Necrotic Tissue Nursing Diagnoses: Impaired tissue integrity related to necrotic/devitalized tissue Knowledge deficit related to management of necrotic/devitalized tissue Goals: Necrotic/devitalized tissue will be minimized in the wound bed Date Initiated: 10/08/2019 Target Resolution Date: 10/15/2019 Goal Status: Active Patient/caregiver will verbalize understanding of reason and process for debridement of necrotic tissue Date Initiated: 10/08/2019 Target Resolution Date: 10/15/2019 Goal Status: Active Interventions: Assess patient pain level pre-, during and post procedure and prior to discharge Provide education on necrotic tissue and debridement process Treatment Activities: Apply topical anesthetic as ordered : 10/08/2019 Notes: Orientation to the Wound Care Program Nursing Diagnoses: Knowledge deficit related to the wound healing center program Goals: Patient/caregiver will verbalize understanding of the [place] Program Date Initiated: 10/08/2019 Target Resolution Date: 10/15/2019 Goal Status: Active Interventions: Provide education on orientation to the wound center Notes: Pain, Acute or Chronic Nursing Diagnoses: Pain, acute or chronic: actual or potential Potential alteration in comfort, pain Goals: Patient will verbalize adequate pain control and receive pain control interventions during procedures as needed Date Initiated: 10/08/2019 Target Resolution  Date: 10/15/2019 Goal Status: Active Interventions: Assess comfort goal upon admission Complete pain assessment as per visit requirements Notes: [place], Charlynne Cousins ([id number]) Soft Tissue Infection Nursing Diagnoses: Impaired tissue integrity Goals: Patient will remain free of wound infection Date Initiated: 10/08/2019 Target Resolution Date: 10/15/2019 Goal Status: Active Interventions: Assess signs and symptoms of infection every visit Notes: Tissue Oxygenation Nursing Diagnoses: Actual ineffective tissue perfusion; peripheral (select once diagnosis is confirmed) Goals: Patient/caregiver will verbalize understanding of disease process and disease management Date Initiated: 10/08/2019 Target Resolution Date: 10/15/2019 Goal Status: Active Interventions: Assess peripheral arterial status upon admission and as needed Treatment Activities: Invasive vascular studies : 10/08/2019 Notes: Wound/Skin Impairment Nursing Diagnoses: Impaired tissue integrity Goals: Patient/caregiver will verbalize understanding of skin care regimen Date Initiated: 10/08/2019 Target Resolution Date: 10/15/2019 Goal Status: Active Interventions: Assess ulceration(s) every visit Treatment Activities: Skin care regimen initiated : 10/08/2019 Topical wound management initiated : 10/08/2019 Notes: Electronic Signature(s) Signed: 10/22/2019 4:13:29 PM By: Montey Hora Entered By: Montey Hora on 10/22/2019 11:05:42 [place], Charlynne Cousins ([id number]) -------------------------------------------------------------------------------- Pain Assessment Details Patient  Name: KLARYSSA, FAUTH. Date of Service: 10/22/2019 10:30 AM Medical Record Number: [id number] Patient Account Number: [id number] Date of Birth/Sex: [birth year] (86 y.o. F) Treating RN: Army Melia Primary Care Marvell Stavola: Pernell Dupre Other Clinician: Referring Lachlan Mckim: Pernell Dupre Treating Blondina Coderre/Extender: Tito Dine in Treatment:  2 Active Problems Location of Pain Severity and Description of Pain Patient Has Paino No Site Locations Pain Management and Medication Current Pain Management: Electronic Signature(s) Signed: 10/22/2019 10:50:28 AM By: Army Melia Entered By: Army Melia on 10/22/2019 10:39:19 [place], Charlynne Cousins ([id number]) -------------------------------------------------------------------------------- Patient/Caregiver Education Details Patient Name: [place], Charlynne Cousins. Date of Service: 10/22/2019 10:30 AM Medical Record Number: [id number] Patient Account Number: [id number] Date of Birth/Gender: 1920/11/06 (86 y.o. F) Treating RN: Montey Hora Primary Care Physician: Pernell Dupre Other Clinician: Referring Physician: Pernell Dupre Treating Physician/Extender: Tito Dine in Treatment: 2 Education Assessment Education Provided To: Patient and Caregiver Education Topics Provided Wound/Skin Impairment: Handouts: Other: wound care as ordered Methods: Demonstration, Explain/Verbal Responses: State content correctly Electronic Signature(s) Signed: 10/22/2019 4:13:29 PM By: Montey Hora Entered By: Montey Hora on 10/22/2019 11:13:49 [place], Charlynne Cousins ([id number]) -------------------------------------------------------------------------------- Wound Assessment Details Patient Name: [place], Charlynne Cousins. Date of Service: 10/22/2019 10:30 AM Medical Record Number: [id number] Patient Account Number: [id number] Date of Birth/Sex: 20-Jan-1921 (86 y.o. F) Treating RN: Army Melia Primary Care Avrey Flanagin: Pernell Dupre Other Clinician: Referring Maday Guarino: Pernell Dupre Treating Inocente Krach/Extender: Tito Dine in Treatment: 2 Wound Status Wound Number: 1 Primary Etiology: Arterial Insufficiency Ulcer Wound Location: Right Toe - Web between 1st and 2nd Wound Status: Open Wounding Event: Gradually Appeared Comorbid History: Anemia, Hypertension, Gout, Osteoarthritis Date  Acquired: 09/08/2019 Weeks Of Treatment: 2 Clustered Wound: No Photos Wound Measurements Length: (cm) 4 Width: (cm) 1.5 Depth: (cm) 0.2 Area: (cm) 4.712 Volume: (cm) 0.942 % Reduction in Area: 21.1% % Reduction in Volume: -57.8% Epithelialization: None Wound Description Classification: Full Thickness Without Exposed Support Structu Exudate Amount: Medium Exudate Type: Serosanguineous Exudate Color: red, brown res Foul Odor After Cleansing: No Slough/Fibrino Yes Wound Bed Granulation Amount: Small (1-33%) Exposed Structure Granulation Quality: Pink Fascia Exposed: No Necrotic Amount: Large (67-100%) Fat Layer (Subcutaneous Tissue) Exposed: Yes Necrotic Quality: Eschar, Adherent Slough Tendon Exposed: No Muscle Exposed: No Joint Exposed: No Bone Exposed: No Treatment Notes Wound #1 (Right Toe - Web between 1st and 2nd) Notes betadine to 2nd toe, silvercel to others with conform Electronic Signature(s) Signed: 10/22/2019 10:50:28 AM By: Shane Crutch, Charlynne Cousins ([id number]) Entered By: Army Melia on 10/22/2019 10:41:04 [place], Charlynne Cousins ([id number]) -------------------------------------------------------------------------------- Wound Assessment Details Patient Name: [place], Charlynne Cousins. Date of Service: 10/22/2019 10:30 AM Medical Record Number: [id number] Patient Account Number: [id number] Date of Birth/Sex: [birth year] (86 y.o. F) Treating RN: Army Melia Primary Care Joesphine Schemm: Pernell Dupre Other Clinician: Referring Jebediah Macrae: Pernell Dupre Treating Yulonda [place]/Extender: Tito Dine in Treatment: 2 Wound Status Wound Number: 2 Primary Etiology: Arterial Insufficiency Ulcer Wound Location: Right, Dorsal Toe Second Wound Status: Open Wounding Event: Gradually Appeared Comorbid History: Anemia, Hypertension, Gout, Osteoarthritis Date Acquired: 09/08/2019 Weeks Of Treatment: 2 Clustered Wound: No Photos Wound Measurements Length: (cm) 2 Width: (cm)  0.8 Depth: (cm) 0.1 Area: (cm) 1.257 Volume: (cm) 0.126 % Reduction in Area: -60.1% % Reduction in Volume: -59.5% Epithelialization: None Wound Description Classification: Full Thickness Without Exposed Support Structu Exudate Amount: Medium Exudate Type: Serosanguineous Exudate Color: red, brown res Foul Odor After Cleansing: No Slough/Fibrino Yes Wound Bed Granulation Amount: None Present (0%) Exposed Structure Necrotic Amount: Large (67-100%) Fascia  Exposed: No Necrotic Quality: Eschar, Adherent Slough Fat Layer (Subcutaneous Tissue) Exposed: Yes Tendon Exposed: No Muscle Exposed: No Joint Exposed: No Bone Exposed: No Treatment Notes Wound #2 (Right, Dorsal Toe Second) Notes betadine to 2nd toe, silvercel to others with conform Electronic Signature(s) Signed: 10/22/2019 10:50:28 AM By: Shane Crutch, Charlynne Cousins ([id number]) Entered By: Army Melia on 10/22/2019 10:41:50 [place], Charlynne Cousins ([id number]) -------------------------------------------------------------------------------- Wound Assessment Details Patient Name: [place], Charlynne Cousins. Date of Service: 10/22/2019 10:30 AM Medical Record Number: [id number] Patient Account Number: [id number] Date of Birth/Sex: 1920/08/15 (86 y.o. F) Treating RN: Army Melia Primary Care Cornelius Marullo: Pernell Dupre Other Clinician: Referring Jery Hollern: Pernell Dupre Treating Claretta Kendra/Extender: Tito Dine in Treatment: 2 Wound Status Wound Number: 3 Primary Etiology: Arterial Insufficiency Ulcer Wound Location: Right, Dorsal Foot Wound Status: Open Wounding Event: Gradually Appeared Comorbid History: Anemia, Hypertension, Gout, Osteoarthritis Date Acquired: 09/08/2019 Weeks Of Treatment: 2 Clustered Wound: No Photos Wound Measurements Length: (cm) 0.1 Width: (cm) 0.1 Depth: (cm) 0.1 Area: (cm) 0.008 Volume: (cm) 0.001 % Reduction in Area: 99.8% % Reduction in Volume: 99.8% Epithelialization: None Wound  Description Classification: Full Thickness Without Exposed Support Structu Exudate Amount: Medium Exudate Type: Serosanguineous Exudate Color: red, brown res Foul Odor After Cleansing: No Slough/Fibrino Yes Wound Bed Granulation Amount: Large (67-100%) Exposed Structure Granulation Quality: Red Fascia Exposed: No Necrotic Amount: Small (1-33%) Fat Layer (Subcutaneous Tissue) Exposed: Yes Tendon Exposed: No Muscle Exposed: No Joint Exposed: No Bone Exposed: No Treatment Notes Wound #3 (Right, Dorsal Foot) Notes betadine to 2nd toe, silvercel to others with conform Electronic Signature(s) Signed: 10/22/2019 10:50:28 AM By: Shane Crutch, Charlynne Cousins ([id number]) Entered By: Army Melia on 10/22/2019 10:42:07 [place], Charlynne Cousins ([id number]) -------------------------------------------------------------------------------- Vitals Details Patient Name: [place], Charlynne Cousins. Date of Service: 10/22/2019 10:30 AM Medical Record Number: [id number] Patient Account Number: [id number] Date of Birth/Sex: [birth year] (86 y.o. F) Treating RN: Army Melia Primary Care Antiono Ettinger: Pernell Dupre Other Clinician: Referring Talisa Petrak: Pernell Dupre Treating Znya Albino/Extender: Tito Dine in Treatment: 2 Vital Signs Time Taken: 10:36 Temperature (F): 98.2 Height (in): 65 Pulse (bpm): 64 Weight (lbs): 126 Respiratory Rate (breaths/min): 16 Body Mass Index (BMI): 21 Blood Pressure (mmHg): 113/46 Reference Range: 80 - 120 mg / dl Electronic Signature(s) Signed: 10/22/2019 10:50:28 AM By: Army Melia Entered By: Army Melia on 10/22/2019 10:36:30

## 2019-10-29 ENCOUNTER — Ambulatory Visit: Payer: Medicare Other | Admitting: Internal Medicine

## 2019-11-05 ENCOUNTER — Emergency Department: Payer: Medicare Other

## 2019-11-05 ENCOUNTER — Inpatient Hospital Stay
Admission: EM | Admit: 2019-11-05 | Discharge: 2019-11-10 | DRG: 240 | Disposition: A | Discharge status: Skilled Nursing Facility | Payer: Medicare Other | Attending: Hospitalist | Admitting: Hospitalist

## 2019-11-05 ENCOUNTER — Other Ambulatory Visit (INDEPENDENT_AMBULATORY_CARE_PROVIDER_SITE_OTHER): Payer: Self-pay | Admitting: Vascular Surgery

## 2019-11-05 ENCOUNTER — Ambulatory Visit: Payer: Medicare Other | Admitting: Internal Medicine

## 2019-11-05 ENCOUNTER — Other Ambulatory Visit: Payer: Self-pay

## 2019-11-05 DIAGNOSIS — K219 Gastro-esophageal reflux disease without esophagitis: Secondary | ICD-10-CM | POA: Diagnosis present

## 2019-11-05 DIAGNOSIS — Z90710 Acquired absence of both cervix and uterus: Secondary | ICD-10-CM | POA: Diagnosis not present

## 2019-11-05 DIAGNOSIS — N1831 Chronic kidney disease, stage 3a: Secondary | ICD-10-CM | POA: Diagnosis present

## 2019-11-05 DIAGNOSIS — R4189 Other symptoms and signs involving cognitive functions and awareness: Secondary | ICD-10-CM | POA: Diagnosis present

## 2019-11-05 DIAGNOSIS — I70261 Atherosclerosis of native arteries of extremities with gangrene, right leg: Secondary | ICD-10-CM | POA: Diagnosis present

## 2019-11-05 DIAGNOSIS — F32A Depression, unspecified: Secondary | ICD-10-CM | POA: Diagnosis present

## 2019-11-05 DIAGNOSIS — I7025 Atherosclerosis of native arteries of other extremities with ulceration: Secondary | ICD-10-CM | POA: Diagnosis not present

## 2019-11-05 DIAGNOSIS — Z79891 Long term (current) use of opiate analgesic: Secondary | ICD-10-CM | POA: Diagnosis not present

## 2019-11-05 DIAGNOSIS — Z9049 Acquired absence of other specified parts of digestive tract: Secondary | ICD-10-CM

## 2019-11-05 DIAGNOSIS — Z7902 Long term (current) use of antithrombotics/antiplatelets: Secondary | ICD-10-CM | POA: Diagnosis not present

## 2019-11-05 DIAGNOSIS — M109 Gout, unspecified: Secondary | ICD-10-CM | POA: Diagnosis present

## 2019-11-05 DIAGNOSIS — D62 Acute posthemorrhagic anemia: Secondary | ICD-10-CM | POA: Diagnosis not present

## 2019-11-05 DIAGNOSIS — D631 Anemia in chronic kidney disease: Secondary | ICD-10-CM | POA: Diagnosis present

## 2019-11-05 DIAGNOSIS — F329 Major depressive disorder, single episode, unspecified: Secondary | ICD-10-CM | POA: Diagnosis present

## 2019-11-05 DIAGNOSIS — I129 Hypertensive chronic kidney disease with stage 1 through stage 4 chronic kidney disease, or unspecified chronic kidney disease: Secondary | ICD-10-CM | POA: Diagnosis present

## 2019-11-05 DIAGNOSIS — Z79899 Other long term (current) drug therapy: Secondary | ICD-10-CM

## 2019-11-05 DIAGNOSIS — L03115 Cellulitis of right lower limb: Secondary | ICD-10-CM | POA: Diagnosis present

## 2019-11-05 DIAGNOSIS — I10 Essential (primary) hypertension: Secondary | ICD-10-CM | POA: Diagnosis not present

## 2019-11-05 DIAGNOSIS — B9620 Unspecified Escherichia coli [E. coli] as the cause of diseases classified elsewhere: Secondary | ICD-10-CM | POA: Diagnosis present

## 2019-11-05 DIAGNOSIS — L97519 Non-pressure chronic ulcer of other part of right foot with unspecified severity: Secondary | ICD-10-CM | POA: Diagnosis present

## 2019-11-05 DIAGNOSIS — N179 Acute kidney failure, unspecified: Secondary | ICD-10-CM | POA: Diagnosis present

## 2019-11-05 DIAGNOSIS — Z20822 Contact with and (suspected) exposure to covid-19: Secondary | ICD-10-CM | POA: Diagnosis present

## 2019-11-05 DIAGNOSIS — N184 Chronic kidney disease, stage 4 (severe): Secondary | ICD-10-CM

## 2019-11-05 DIAGNOSIS — I96 Gangrene, not elsewhere classified: Secondary | ICD-10-CM | POA: Diagnosis present

## 2019-11-05 DIAGNOSIS — Z7982 Long term (current) use of aspirin: Secondary | ICD-10-CM

## 2019-11-05 DIAGNOSIS — I739 Peripheral vascular disease, unspecified: Secondary | ICD-10-CM | POA: Diagnosis present

## 2019-11-05 LAB — COMPREHENSIVE METABOLIC PANEL
ALT: 24 U/L (ref 0–44)
AST: 27 U/L (ref 15–41)
Albumin: 2.9 g/dL — ABNORMAL LOW (ref 3.5–5.0)
Alkaline Phosphatase: 70 U/L (ref 38–126)
Anion gap: 12 (ref 5–15)
BUN: 45 mg/dL — ABNORMAL HIGH (ref 8–23)
CO2: 25 mmol/L (ref 22–32)
Calcium: 9.2 mg/dL (ref 8.9–10.3)
Chloride: 104 mmol/L (ref 98–111)
Creatinine, Ser: 1.52 mg/dL — ABNORMAL HIGH (ref 0.44–1.00)
GFR calc Af Amer: 33 mL/min — ABNORMAL LOW (ref >60)
GFR calc non Af Amer: 28 mL/min — ABNORMAL LOW (ref >60)
Glucose, Bld: 111 mg/dL — ABNORMAL HIGH (ref 70–99)
Potassium: 4.7 mmol/L (ref 3.5–5.1)
Sodium: 141 mmol/L (ref 135–145)
Total Bilirubin: 0.6 mg/dL (ref 0.3–1.2)
Total Protein: 8.8 g/dL — ABNORMAL HIGH (ref 6.5–8.1)

## 2019-11-05 LAB — LACTIC ACID, PLASMA
Lactic Acid, Venous: 3.1 mmol/L (ref 0.5–1.9)
Lactic Acid, Venous: 1.7 mmol/L (ref 0.5–1.9)

## 2019-11-05 LAB — CBC WITH DIFFERENTIAL/PLATELET
Abs Immature Granulocytes: 0.07 10*3/uL (ref 0.00–0.07)
Basophils Absolute: 0 10*3/uL (ref 0.0–0.1)
Basophils Relative: 0 %
Eosinophils Absolute: 0 10*3/uL (ref 0.0–0.5)
Eosinophils Relative: 0 %
HCT: 27.8 % — ABNORMAL LOW (ref 36.0–46.0)
Hemoglobin: 9.3 g/dL — ABNORMAL LOW (ref 12.0–15.0)
Immature Granulocytes: 0 %
Lymphocytes Relative: 14 %
Lymphs Abs: 2.2 10*3/uL (ref 0.7–4.0)
MCH: 26.2 pg (ref 26.0–34.0)
MCHC: 33.5 g/dL (ref 30.0–36.0)
MCV: 78.3 fL — ABNORMAL LOW (ref 80.0–100.0)
Monocytes Absolute: 1 10*3/uL (ref 0.1–1.0)
Monocytes Relative: 6 %
Neutro Abs: 12.9 10*3/uL — ABNORMAL HIGH (ref 1.7–7.7)
Neutrophils Relative %: 80 %
Platelets: 544 10*3/uL — ABNORMAL HIGH (ref 150–400)
RBC: 3.55 MIL/uL — ABNORMAL LOW (ref 3.87–5.11)
RDW: 15.4 % (ref 11.5–15.5)
WBC: 16.3 10*3/uL — ABNORMAL HIGH (ref 4.0–10.5)
nRBC: 0 % (ref 0.0–0.2)

## 2019-11-05 LAB — PROTIME-INR
INR: 1.2 (ref 0.8–1.2)
Prothrombin Time: 14.9 seconds (ref 11.4–15.2)

## 2019-11-05 LAB — URINALYSIS, ROUTINE W REFLEX MICROSCOPIC
Bilirubin Urine: NEGATIVE
Glucose, UA: NEGATIVE mg/dL
Hgb urine dipstick: NEGATIVE
Ketones, ur: NEGATIVE mg/dL
Leukocytes,Ua: NEGATIVE
Nitrite: NEGATIVE
Protein, ur: NEGATIVE mg/dL
Specific Gravity, Urine: 1.017 (ref 1.005–1.030)
pH: 7 (ref 5.0–8.0)

## 2019-11-05 LAB — APTT: aPTT: 29 seconds (ref 24–36)

## 2019-11-05 LAB — SARS CORONAVIRUS 2 BY RT PCR (HOSPITAL ORDER, PERFORMED IN ~~LOC~~ HOSPITAL LAB): SARS Coronavirus 2: NEGATIVE

## 2019-11-05 MED ORDER — SODIUM CHLORIDE 0.9 % IV SOLN: 1.0000 g | Freq: Once | INTRAVENOUS | Status: DC

## 2019-11-05 MED ORDER — MORPHINE SULFATE (PF) 4 MG/ML IV SOLN
INTRAVENOUS | Status: AC
  Administered 2019-11-05: 4 mg via INTRAVENOUS
  Filled 2019-11-05: qty 1

## 2019-11-05 MED ORDER — ONDANSETRON HCL 4 MG PO TABS: 4.0000 mg | ORAL_TABLET | Freq: Four times a day (QID) | ORAL | Status: DC | PRN

## 2019-11-05 MED ORDER — VANCOMYCIN HCL IN DEXTROSE 1-5 GM/200ML-% IV SOLN: 1000.0000 mg | Freq: Once | INTRAVENOUS | Status: DC

## 2019-11-05 MED ORDER — SERTRALINE HCL 50 MG PO TABS
25.0000 mg | ORAL_TABLET | Freq: Every day | ORAL | Status: DC
  Administered 2019-11-05 – 2019-11-10 (×5): 25 mg via ORAL
  Filled 2019-11-05 (×5): qty 1

## 2019-11-05 MED ORDER — GABAPENTIN 300 MG PO CAPS
300.0000 mg | ORAL_CAPSULE | Freq: Three times a day (TID) | ORAL | Status: DC
  Administered 2019-11-05 – 2019-11-08 (×8): 300 mg via ORAL
  Filled 2019-11-05 (×9): qty 1

## 2019-11-05 MED ORDER — MORPHINE SULFATE (PF) 2 MG/ML IV SOLN
2.0000 mg | Freq: Once | INTRAVENOUS | Status: AC
  Administered 2019-11-05: 2 mg via INTRAVENOUS
  Filled 2019-11-05: qty 1

## 2019-11-05 MED ORDER — SODIUM CHLORIDE 0.9 % IV SOLN
2.0000 g | INTRAVENOUS | Status: DC
  Administered 2019-11-05 – 2019-11-08 (×4): 2 g via INTRAVENOUS
  Filled 2019-11-05: qty 2
  Filled 2019-11-05 (×2): qty 20
  Filled 2019-11-05: qty 2
  Filled 2019-11-05 (×2): qty 20

## 2019-11-05 MED ORDER — METOPROLOL TARTRATE 25 MG PO TABS
25.0000 mg | ORAL_TABLET | Freq: Two times a day (BID) | ORAL | Status: DC
  Administered 2019-11-05 – 2019-11-10 (×10): 25 mg via ORAL
  Filled 2019-11-05 (×12): qty 1

## 2019-11-05 MED ORDER — ATORVASTATIN CALCIUM 10 MG PO TABS
10.0000 mg | ORAL_TABLET | Freq: Every day | ORAL | Status: DC
  Administered 2019-11-05 – 2019-11-10 (×5): 10 mg via ORAL
  Filled 2019-11-05 (×6): qty 1

## 2019-11-05 MED ORDER — FOLIC ACID 1 MG PO TABS: 1.0000 mg | ORAL_TABLET | Freq: Every day | ORAL | Status: DC

## 2019-11-05 MED ORDER — HYDRALAZINE HCL 50 MG PO TABS
50.0000 mg | ORAL_TABLET | Freq: Two times a day (BID) | ORAL | Status: DC
  Administered 2019-11-05 – 2019-11-10 (×8): 50 mg via ORAL
  Filled 2019-11-05 (×11): qty 1

## 2019-11-05 MED ORDER — DOCUSATE SODIUM 100 MG PO CAPS
200.0000 mg | ORAL_CAPSULE | Freq: Two times a day (BID) | ORAL | Status: DC
  Administered 2019-11-05 – 2019-11-08 (×5): 200 mg via ORAL
  Filled 2019-11-05 (×6): qty 2

## 2019-11-05 MED ORDER — METRONIDAZOLE IN NACL 5-0.79 MG/ML-% IV SOLN
500.0000 mg | Freq: Three times a day (TID) | INTRAVENOUS | Status: DC
  Administered 2019-11-05 – 2019-11-09 (×12): 500 mg via INTRAVENOUS
  Filled 2019-11-05 (×14): qty 100

## 2019-11-05 MED ORDER — AMLODIPINE BESYLATE 5 MG PO TABS
5.0000 mg | ORAL_TABLET | Freq: Every day | ORAL | Status: DC
  Administered 2019-11-05 – 2019-11-10 (×4): 5 mg via ORAL
  Filled 2019-11-05 (×6): qty 1

## 2019-11-05 MED ORDER — PANTOPRAZOLE SODIUM 40 MG PO TBEC
40.0000 mg | DELAYED_RELEASE_TABLET | Freq: Every day | ORAL | Status: DC
  Administered 2019-11-05 – 2019-11-08 (×4): 40 mg via ORAL
  Filled 2019-11-05 (×4): qty 1

## 2019-11-05 MED ORDER — ALLOPURINOL 100 MG PO TABS
300.0000 mg | ORAL_TABLET | Freq: Every day | ORAL | Status: DC
  Administered 2019-11-06 – 2019-11-10 (×5): 300 mg via ORAL
  Filled 2019-11-05 (×4): qty 3
  Filled 2019-11-05: qty 1
  Filled 2019-11-05: qty 3
  Filled 2019-11-05: qty 1

## 2019-11-05 MED ORDER — VANCOMYCIN HCL 500 MG/100ML IV SOLN
500.0000 mg | INTRAVENOUS | Status: DC
  Administered 2019-11-07: 500 mg via INTRAVENOUS
  Filled 2019-11-05 (×2): qty 100

## 2019-11-05 MED ORDER — VANCOMYCIN HCL IN DEXTROSE 1-5 GM/200ML-% IV SOLN
1000.0000 mg | Freq: Once | INTRAVENOUS | Status: AC
  Administered 2019-11-05: 1000 mg via INTRAVENOUS
  Filled 2019-11-05: qty 200

## 2019-11-05 MED ORDER — SODIUM CHLORIDE 0.9 % IV SOLN
1.0000 g | Freq: Once | INTRAVENOUS | Status: AC
  Administered 2019-11-05: 1 g via INTRAVENOUS
  Filled 2019-11-05: qty 10

## 2019-11-05 MED ORDER — POLYSACCHARIDE IRON COMPLEX 150 MG PO CAPS
150.0000 mg | ORAL_CAPSULE | Freq: Every morning | ORAL | Status: DC
  Administered 2019-11-07 – 2019-11-10 (×4): 150 mg via ORAL
  Filled 2019-11-05 (×7): qty 1

## 2019-11-05 MED ORDER — COLCHICINE 0.6 MG PO TABS
0.6000 mg | ORAL_TABLET | Freq: Every day | ORAL | Status: DC
  Administered 2019-11-06: 0.6 mg via ORAL
  Filled 2019-11-05 (×3): qty 1

## 2019-11-05 MED ORDER — VANCOMYCIN HCL 10 G IV SOLR: 1.0000 g | Freq: Once | INTRAVENOUS | Status: DC

## 2019-11-05 MED ORDER — MORPHINE SULFATE (PF) 4 MG/ML IV SOLN: 4.0000 mg | Freq: Once | INTRAVENOUS | Status: AC

## 2019-11-05 MED ORDER — SODIUM CHLORIDE 0.45 % IV SOLN: INTRAVENOUS | Status: DC

## 2019-11-05 MED ORDER — MORPHINE SULFATE (PF) 2 MG/ML IV SOLN
2.0000 mg | INTRAVENOUS | Status: DC | PRN
  Administered 2019-11-05 – 2019-11-09 (×5): 2 mg via INTRAVENOUS
  Filled 2019-11-05 (×5): qty 1

## 2019-11-05 MED ORDER — ONDANSETRON HCL 4 MG/2ML IJ SOLN
4.0000 mg | Freq: Four times a day (QID) | INTRAMUSCULAR | Status: DC | PRN
  Administered 2019-11-05: 4 mg via INTRAVENOUS
  Filled 2019-11-05: qty 2

## 2019-11-05 MED ORDER — HEPARIN SODIUM (PORCINE) 5000 UNIT/ML IJ SOLN
5000.0000 [IU] | Freq: Three times a day (TID) | INTRAMUSCULAR | Status: DC
  Administered 2019-11-05 – 2019-11-10 (×13): 5000 [IU] via SUBCUTANEOUS
  Filled 2019-11-05 (×14): qty 1

## 2019-11-05 NOTE — ED Notes (Signed)
Report called to [facility] rn

## 2019-11-05 NOTE — Consult Note (Signed)
[facility] P[facility] / FOOT AND ANKLE SURGERY CONSULTATION NOTE  Requesting Physician: Dr. Francine Graven  Reason for consult: R foot necrosis   Chief Complaint: R foot pain, necrosis, infection, wound   HPI: [name] is a 86 y.o. female who presents resting in bed moaning in pain.  Patient is a poor historian overall and do not get much history from the patient today.  She states though that she does have pain to her right foot and leg.  Patient has a history of necrosis to the right foot and has been seeing [facility] vascular surgery for this issue.  She has had a CT angiogram within the past month that showed significant vascular disease.  Patient presented to the ED due to worsening pain and discomfort to the right lower extremity and worsening of wound.  Podiatry team was consulted for evaluation of wound.  PMHx:  Past Medical History:  Diagnosis Date  . Arthritis   . GERD (gastroesophageal reflux disease)   . Hypertension   . Neck fracture ([place]) 2015   C2    Surgical Hx:  Past Surgical History:  Procedure Laterality Date  . ABDOMINAL HYSTERECTOMY    . CHOLECYSTECTOMY  08/06/14  . ESOPHAGOGASTRODUODENOSCOPY (EGD) WITH PROPOFOL N/A 08/03/2016   Procedure: ESOPHAGOGASTRODUODENOSCOPY (EGD) WITH PROPOFOL;  Surgeon: Ronald Lobo, MD;  Location: [facility];  Service: Gastroenterology;  Laterality: N/A;  . EYE SURGERY  2015   cataract  . LOWER EXTREMITY ANGIOGRAPHY Right 10/20/2019   Procedure: LOWER EXTREMITY ANGIOGRAPHY;  Surgeon: Algernon Huxley, MD;  Location: [place] [facility];  Service: Cardiovascular;  Laterality: Right;    FHx:  Family History  Problem Relation Age of Onset  . Rheumatologic disease Mother     Social History:  reports that she has never smoked. She has never used smokeless tobacco. She reports that she does not drink alcohol and does not use drugs.  Allergies: No Known Allergies  Review of Systems: Unable to obtain due to patient  status.  (Not in a hospital admission)   Physical Exam: General: Alert and oriented.  No apparent distress.  Vascular: DP/PT pulses nonpalpable bilateral.  Capillary fill time delayed to the right foot.  No hair growth to digits of both feet.  Edema present to the right foot and ankle no erythema.  Right lower extremity is cool to touch  Neuro: Light touch sensation intact to digits bilaterally.  Derm: Right foot appears to have a wound between the first and second toes that probes to the subcutaneous tissue but also has extensive necrosis to the dorsum of the foot that extends past the tarsometatarsal joints.  Also has some gangrenous changes to the plantar aspects of digits 1 through 5.     MSK: Pain on palpation right foot globally.  Results for orders placed or performed during the hospital encounter of 11/05/19 (from the past 48 hour(s))  Lactic acid, plasma     Status: Abnormal   Collection Time: 11/05/19  5:31 AM  Result Value Ref Range   Lactic Acid, Venous 3.1 (HH) 0.5 - 1.9 mmol/L    Comment: CRITICAL RESULT CALLED TO, READ BACK BY AND VERIFIED WITH MEGAN BROWN@0601  ON 11/04/19 BY HKP Performed at [facility], [address] [place], [place] [address]   Comprehensive metabolic panel     Status: Abnormal   Collection Time: 11/05/19  5:31 AM  Result Value Ref Range   Sodium 141 135 - 145 mmol/L   Potassium 4.7  3.5 - 5.1 mmol/L   Chloride 104 98 - 111 mmol/L   CO2 25 22 - 32 mmol/L   Glucose, Bld 111 (H) 70 - 99 mg/dL    Comment: Glucose reference range applies only to samples taken after fasting for at least 8 hours.   BUN 45 (H) 8 - 23 mg/dL   Creatinine, Ser 1.52 (H) 0.44 - 1.00 mg/dL   Calcium 9.2 8.9 - 10.3 mg/dL   Total Protein 8.8 (H) 6.5 - 8.1 g/dL   Albumin 2.9 (L) 3.5 - 5.0 g/dL   AST 27 15 - 41 U/L   ALT 24 0 - 44 U/L   Alkaline Phosphatase 70 38 - 126 U/L   Total Bilirubin 0.6 0.3 - 1.2 mg/dL   GFR calc non Af Amer 28 (L) >60 mL/min   GFR  calc Af Amer 33 (L) >60 mL/min   Anion gap 12 5 - 15    Comment: Performed at [facility], [address] [place], [place] [address]  CBC WITH DIFFERENTIAL     Status: Abnormal   Collection Time: 11/05/19  5:31 AM  Result Value Ref Range   WBC 16.3 (H) 4.0 - 10.5 K/uL   RBC 3.55 (L) 3.87 - 5.11 MIL/uL   Hemoglobin 9.3 (L) 12.0 - 15.0 g/dL   HCT 27.8 (L) 36 - 46 %   MCV 78.3 (L) 80.0 - 100.0 fL   MCH 26.2 26.0 - 34.0 pg   MCHC 33.5 30.0 - 36.0 g/dL   RDW 15.4 11.5 - 15.5 %   Platelets 544 (H) 150 - 400 K/uL   nRBC 0.0 0.0 - 0.2 %   Neutrophils Relative % 80 %   Neutro Abs 12.9 (H) 1.7 - 7.7 K/uL   Lymphocytes Relative 14 %   Lymphs Abs 2.2 0.7 - 4.0 K/uL   Monocytes Relative 6 %   Monocytes Absolute 1.0 0 - 1 K/uL   Eosinophils Relative 0 %   Eosinophils Absolute 0.0 0 - 0 K/uL   Basophils Relative 0 %   Basophils Absolute 0.0 0 - 0 K/uL   Immature Granulocytes 0 %   Abs Immature Granulocytes 0.07 0.00 - 0.07 K/uL    Comment: Performed at [facility], [address] [place], [place] [address]  APTT     Status: None   Collection Time: 11/05/19  5:31 AM  Result Value Ref Range   aPTT 29 24 - 36 seconds    Comment: Performed at [facility], [address] [place], [place] [address]  Protime-INR     Status: None   Collection Time: 11/05/19  5:31 AM  Result Value Ref Range   Prothrombin Time 14.9 11.4 - 15.2 seconds   INR 1.2 0.8 - 1.2    Comment: (NOTE) INR goal varies based on device and disease states. Performed at [facility], [address] [place], [place] [address]   Blood Culture (routine x 2)     Status: None (Preliminary result)   Collection Time: 11/05/19  5:31 AM   Specimen: BLOOD LEFT WRIST  Result Value Ref Range   Specimen Description BLOOD LEFT WRIST    Special Requests      BOTTLES DRAWN AEROBIC AND ANAEROBIC Blood Culture adequate volume   Culture      NO GROWTH < 12 HOURS Performed at [facility], [address] [place], [place] [address]    Report Status PENDING   Blood Culture (routine x 2)  Status: None (Preliminary result)   Collection Time: 11/05/19  5:36 AM   Specimen: Left Antecubital; Blood  Result Value Ref Range   Specimen Description LEFT ANTECUBITAL    Special Requests      BOTTLES DRAWN AEROBIC AND ANAEROBIC Blood Culture adequate volume   Culture      NO GROWTH < 12 HOURS Performed at [facility], [address], [place], [place] [address]    Report Status PENDING   SARS Coronavirus 2 by RT PCR (hospital order, performed in [place] hospital lab) Nasopharyngeal Nasopharyngeal Swab     Status: None   Collection Time: 11/05/19  6:50 AM   Specimen: Nasopharyngeal Swab  Result Value Ref Range   SARS Coronavirus 2 NEGATIVE NEGATIVE    Comment: (NOTE) SARS-CoV-2 target nucleic acids are NOT DETECTED.  The SARS-CoV-2 RNA is generally detectable in upper and lower respiratory specimens during the acute phase of infection. The lowest concentration of SARS-CoV-2 viral copies this assay can detect is 250 copies / mL. A negative result does not preclude SARS-CoV-2 infection and should not be used as the sole basis for treatment or other patient management decisions.  A negative result may occur with improper specimen collection / handling, submission of specimen other than nasopharyngeal swab, presence of viral mutation(s) within the areas targeted by this assay, and inadequate number of viral copies (<250 copies / mL). A negative result must be combined with clinical observations, patient history, and epidemiological information.  Fact Sheet for Patients:   StrictlyIdeas.no  Fact Sheet for Healthcare Providers: BankingDealers.co.za  This test is not yet approved or  cleared by the [place] FDA and has been authorized for detection and/or diagnosis of SARS-CoV-2 by FDA under an Emergency Use  Authorization (EUA).  This EUA will remain in effect (meaning this test can be used) for the duration of the COVID-19 declaration under Section 564(b)(1) of the Act, 21 U.S.C. section 360bbb-3(b)(1), unless the authorization is terminated or revoked sooner.  Performed at [facility], [address] [place], [place] [address]   Lactic acid, plasma     Status: None   Collection Time: 11/05/19  7:15 AM  Result Value Ref Range   Lactic Acid, Venous 1.7 0.5 - 1.9 mmol/L    Comment: Performed at [facility], [address], [place], [place] [address]   DG Foot Complete Right  Result Date: 11/05/2019 CLINICAL DATA:  Right leg pain.  Osteomyelitis. EXAM: RIGHT FOOT COMPLETE - 3+ VIEW COMPARISON:  Right foot radiographs 09/11/2019 FINDINGS: Bone is scratched at the foot is diffusely demineralized. No focal erosive lesions are present. Microvascular calcifications are evident. No acute or healing fracture is present. IMPRESSION: 1. No acute or healing fracture. 2. Diffuse demineralization. 3. Microvascular calcifications suggesting diabetes. 4. No focal osseous erosion. While osteomyelitis is not identified, it is not excluded. Electronically Signed   By: San Morelle M.D.   On: 11/05/2019 07:21    Blood pressure 117/68, pulse 89, temperature 99.9 F (37.7 C), temperature source Oral, resp. rate 18, height 5' (1.524 m), weight 52.2 kg, SpO2 100 %.   Assessment 1. Extensive gangrenous necrosis of right foot 2. Right foot subcutaneous ulceration between first and second toes 3. PVD severe  Plan -Patient seen and examined. -X-rays reviewed discussed with patient in detail. -Believe at this time that the right lower extremity is not salvageable due to the blood flow present and the amount of necrosis present.  Discussed this with patient in  detail. -Defer to vascular surgery for further intervention and amputation more proximally.  Vascular surgery consultation has  been placed. -Wound culture taken and sent off.  Appreciate better recommendations for antibiotics.  Believe that blood flow limits the antibiotic therapy to the foot. -Dressed with Betadine soaked gauze wet to dry dressing.  Orders placed for dressing changes daily.  Podiatry team to sign off at this time.  Please reconsult if any problems arise.  Caroline More, DPM 11/05/2019, 1:38 PM

## 2019-11-05 NOTE — ED Notes (Signed)
Pt has not shown any urinary output, bladder scan performed and shows 534ml, MD notified and orders initiated.

## 2019-11-05 NOTE — ED Provider Notes (Signed)
[facility] Emergency Department Provider Note  ____________________________________________   First MD Initiated Contact with Patient 11/05/19 [phone]     (approximate)  I have reviewed the triage vital signs and the nursing notes.  Level 5 caveat history review of system limited secondary to patient being a poor historian.  History primarily obtained from the patient's son and EMS HISTORY  Chief Complaint Leg Pain    HPI [name] is a 86 y.o. female with below list of previous medical conditions including peripheral artery disease status post angioplasty performed on 10/20/2019 by Dr. Lucky Cowboy presents to the emergency department with 10 out of 10 of the right leg.  Patient however states that she "hurts all over".  I did review Dr. Bunnie Domino note from 10/20/2019.  Patient has a chronic ulcer on the dorsal aspect of her right foot extensive peripheral artery disease of the right leg     Past Medical History:  Diagnosis Date  . Arthritis   . GERD (gastroesophageal reflux disease)   . Hypertension   . Neck fracture ([place]) 2015   C2    Patient Active Problem List   Diagnosis Date Noted  . Gangrene of right foot ([place]) 11/05/2019  . Atherosclerosis of native arteries of the extremities with ulceration ([place]) 10/17/2019  . Bedbug bite 04/22/2019  . Hypotension 04/22/2019  . Gout 04/22/2019  . AKI (acute kidney injury) ([place]) 04/22/2019  . Cognitive impairment 04/22/2019  . Acute anemia 04/21/2019  . HTN (hypertension) 08/02/2016  . UGIB (upper gastrointestinal bleed) 08/02/2016  . Acute blood loss anemia 08/02/2016  . Climacteric arthritis, lower leg 03/22/2016  . Depression 03/22/2016  . Esophageal reflux 03/22/2016  . Osteoarthritis of knee 09/07/2015  . Closed fracture of second cervical vertebra ([place]) 09/01/2015  . Abdominal pain 03/17/2015  . Chronic kidney disease 04/28/2014    Past Surgical History:  Procedure Laterality Date  . ABDOMINAL  HYSTERECTOMY    . CHOLECYSTECTOMY  08/06/14  . ESOPHAGOGASTRODUODENOSCOPY (EGD) WITH PROPOFOL N/A 08/03/2016   Procedure: ESOPHAGOGASTRODUODENOSCOPY (EGD) WITH PROPOFOL;  Surgeon: Ronald Lobo, MD;  Location: [facility];  Service: Gastroenterology;  Laterality: N/A;  . EYE SURGERY  2015   cataract  . LOWER EXTREMITY ANGIOGRAPHY Right 10/20/2019   Procedure: LOWER EXTREMITY ANGIOGRAPHY;  Surgeon: Algernon Huxley, MD;  Location: [place] [facility];  Service: Cardiovascular;  Laterality: Right;    Prior to Admission medications   Medication Sig Start Date End Date Taking? Authorizing Provider  allopurinol (ZYLOPRIM) 300 MG tablet Take 300 mg by mouth daily.    [provider]  amLODipine (NORVASC) 5 MG tablet Take 1 tablet (5 mg total) by mouth daily. 04/26/19 04/25/20  Kathie Dike, MD  aspirin EC 81 MG tablet Take 1 tablet (81 mg total) by mouth daily. 10/20/19   Algernon Huxley, MD  atorvastatin (LIPITOR) 10 MG tablet Take 1 tablet (10 mg total) by mouth daily. 10/20/19 10/19/20  Algernon Huxley, MD  clopidogrel (PLAVIX) 75 MG tablet Take 1 tablet (75 mg total) by mouth daily. 10/20/19   Algernon Huxley, MD  COLCRYS 0.6 MG tablet Take 1 tablet by mouth daily as needed. 04/14/19   [provider]  diclofenac Sodium (VOLTAREN) 1 % GEL Apply 2 g topically 2 (two) times daily. 04/09/19   [provider]  docusate sodium (COLACE) 100 MG capsule Docusate Sodium 100 MG Oral Capsule QTY: 0 capsule Days: 0 Refills: 0  Written: 06/01/15 Patient Instructions: 06/01/15   [provider]  doxycycline (VIBRA-TABS) 100 MG tablet Take 1 tablet (100 mg total) by mouth 2 (two) times daily. 09/18/19   [name], DPM  folic acid (FOLVITE) 1 MG tablet Folic Acid 1 MG Oral Tablet QTY: 90 each Days: 90 Refills: 0  Written: 04/19/18 Patient Instructions: TAKE ONE TABLET BY MOUTH EVERY DAY 04/19/18   [provider]  gabapentin (NEURONTIN) 300 MG capsule Take 300 mg by mouth 3 (three)  times daily. 10/10/19   [provider]  hydrALAZINE (APRESOLINE) 50 MG tablet Take 50 mg by mouth 2 (two) times daily.     [provider]  HYDROcodone-acetaminophen (NORCO) 7.5-325 MG tablet Take 1 tablet by mouth every 6 (six) hours as needed. 10/10/19   [provider]  HYDROcodone-acetaminophen (NORCO/VICODIN) 5-325 MG tablet Take 1 tablet by mouth 2 (two) times daily.     [provider]  metoprolol tartrate (LOPRESSOR) 25 MG tablet Take 25 mg by mouth 2 (two) times daily. 07/11/19   [provider]  pantoprazole (PROTONIX) 40 MG tablet Pantoprazole Sodium 40 MG Oral Tablet Delayed Release QTY: 30 each Days: 30 Refills: 2  Written: 07/05/18 Patient Instructions: TAKE 1 TABLET BY MOUTH DAILY 07/05/18   [provider]  POLY-IRON 150 FORTE 150-25-1 MG-MCG-MG CAPS Take 1 capsule by mouth every morning. 03/28/19   [provider]  sertraline (ZOLOFT) 25 MG tablet Take 25 mg by mouth daily with breakfast.     [provider]  tiZANidine (ZANAFLEX) 2 MG tablet Take 4 mg by mouth at bedtime.    [provider]  UNABLE TO FIND APPLY TWICE DAILY TO KNEE 01/31/19   [provider]    Allergies Patient has no known allergies.  Family History  Problem Relation Age of Onset  . Rheumatologic disease Mother     Social History Social History   Tobacco Use  . Smoking status: Never Smoker  . Smokeless tobacco: Never Used  Substance Use Topics  . Alcohol use: No    Alcohol/week: 0.0 standard drinks  . Drug use: No    Review of Systems Constitutional: No fever/chills Eyes: No visual changes. ENT: No sore throat. Cardiovascular: Denies chest pain. Respiratory: Denies shortness of breath. Gastrointestinal: No abdominal pain.  No nausea, no vomiting.  No diarrhea.  No constipation. Genitourinary: Negative for dysuria. Musculoskeletal: Negative for neck pain.  Negative for back pain. Integumentary: Negative  for rash. Neurological: Negative for headaches, focal weakness or numbness.   ____________________________________________   PHYSICAL EXAM:  VITAL SIGNS: ED Triage Vitals  Enc Vitals Group     BP 11/05/19 0514 (!) 168/75     Pulse Rate 11/05/19 0514 90     Resp 11/05/19 0514 16     Temp 11/05/19 0514 97.8 F (36.6 C)     Temp Source 11/05/19 0514 Oral     SpO2 11/05/19 0514 95 %     Weight 11/05/19 0518 52.2 kg (115 lb)     Height 11/05/19 0518 1.524 m (5')     Head Circumference --      Peak Flow --      Pain Score --      Pain Loc --      Pain Edu? --      Excl. in [place]? --     Constitutional: Alert and oriented.  Apparent discomfort Eyes: Conjunctivae are normal.  Head: Atraumatic.Marland Kitchen Mouth/Throat: Patient is wearing a mask. Neck: No stridor.  No meningeal signs.   Cardiovascular:  Normal rate, regular rhythm. Good peripheral circulation. Grossly normal heart sounds. Respiratory: Normal respiratory effort.  No retractions. Gastrointestinal: Soft and nontender. No distention.  Musculoskeletal: Nonpalpable PT DP right lower extremity. Neurologic:  Normal speech and language. No gross focal neurologic deficits are appreciated.  Skin: Right foot gangrenous skin changes malodorous ulcer on the dorsal aspect Psychiatric: Mood and affect are normal. Speech and behavior are normal.  ____________________________________________   LABS (all labs ordered are listed, but only abnormal results are displayed)  Labs Reviewed  LACTIC ACID, PLASMA - Abnormal; Notable for the following components:      Result Value   Lactic Acid, Venous 3.1 (*)    All other components within normal limits  COMPREHENSIVE METABOLIC PANEL - Abnormal; Notable for the following components:   Glucose, Bld 111 (*)    BUN 45 (*)    Creatinine, Ser 1.52 (*)    Total Protein 8.8 (*)    Albumin 2.9 (*)    GFR calc non Af Amer 28 (*)    GFR calc Af Amer 33 (*)    All other components within normal limits    CBC WITH DIFFERENTIAL/PLATELET - Abnormal; Notable for the following components:   WBC 16.3 (*)    RBC 3.55 (*)    Hemoglobin 9.3 (*)    HCT 27.8 (*)    MCV 78.3 (*)    Platelets 544 (*)    Neutro Abs 12.9 (*)    All other components within normal limits  CULTURE, BLOOD (ROUTINE X 2)  CULTURE, BLOOD (ROUTINE X 2)  URINE CULTURE  APTT  PROTIME-INR  LACTIC ACID, PLASMA  URINALYSIS, ROUTINE W REFLEX MICROSCOPIC   ____________________________________________  EKG  ED ECG REPORT I, [place] Strider Vallance, the attending physician, personally viewed and interpreted this ECG.   Date: 11/05/2019  EKG Time: 5:15 AM  Rate: 88  Rhythm: Normal sinus rhythm  Axis: Normal  Intervals: Normal  ST&T Change: None  ____________________________________________  RADIOLOGY I, [place], personally viewed and evaluated these images (plain radiographs) as part of my medical decision making, as well as reviewing the written report by the radiologist.  ED MD interpretation: No acute or doing fracture.  Diffuse demineralization on right foot x-ray per radiology  Official radiology report(s): DG Foot Complete Right  Result Date: 11/05/2019 CLINICAL DATA:  Right leg pain.  Osteomyelitis. EXAM: RIGHT FOOT COMPLETE - 3+ VIEW COMPARISON:  Right foot radiographs 09/11/2019 FINDINGS: Bone is scratched at the foot is diffusely demineralized. No focal erosive lesions are present. Microvascular calcifications are evident. No acute or healing fracture is present. IMPRESSION: 1. No acute or healing fracture. 2. Diffuse demineralization. 3. Microvascular calcifications suggesting diabetes. 4. No focal osseous erosion. While osteomyelitis is not identified, it is not excluded. Electronically Signed   By: San Morelle M.D.   On: 11/05/2019 07:21    ____________________________________________   PROCEDURES   .Critical Care Performed by: Gregor Hams, MD Authorized by: Gregor Hams, MD    Critical care provider statement:    Critical care time (minutes):  30   Critical care time was exclusive of:  Separately billable procedures and treating other patients   Critical care was necessary to treat or prevent imminent or life-threatening deterioration of the following conditions:  Sepsis   Critical care was time spent personally by me on the following activities:  Development of treatment plan with patient or surrogate, discussions with consultants, evaluation of patient's response to treatment, examination of patient, obtaining  history from patient or surrogate, ordering and performing treatments and interventions, ordering and review of laboratory studies, ordering and review of radiographic studies, pulse oximetry, re-evaluation of patient's condition and review of old charts     ____________________________________________   INITIAL IMPRESSION / MDM / [place] / ED COURSE  As part of my medical decision making, I reviewed the following data within the electronic MEDICAL RECORD NUMBER   86 year old female presented with above-stated history and physical exam differential diagnosis including but not limited to infected right foot ulcer with gangrenous changes with known severe peripheral artery disease.  Concern for possible sepsis as well as a sepsis protocol was initiated.  Broad-spectrum IV antibiotic therapy administered.  Patient received 2 mg of IV morphine initially with no improvement in pain and as such an additional 4 mg of morphine was administered.  Patient discussed with Dr. Delana Meyer vascular surgery who agreed with management thus far and will consult on the patient.  Patient subsequently discussed with Dr. Sidney Ace hospitalist for admission for further evaluation and management. ____________________________________________  FINAL CLINICAL IMPRESSION(S) / ED DIAGNOSES  Final diagnoses:  Gangrene of right foot ([place])     MEDICATIONS GIVEN DURING THIS  VISIT:  Medications  vancomycin (VANCOCIN) IVPB 1000 mg/200 mL premix (1,000 mg Intravenous New Bag/Given 11/05/19 0647)  morphine 2 MG/ML injection 2 mg (2 mg Intravenous Given 11/05/19 0539)  morphine 4 MG/ML injection 4 mg (4 mg Intravenous Given 11/05/19 0603)  cefTRIAXone (ROCEPHIN) 1 g in sodium chloride 0.9 % 100 mL IVPB (0 g Intravenous Stopped 11/05/19 0645)     ED Discharge Orders    None      *Please note:  [name] was evaluated in Emergency Department on 11/05/2019 for the symptoms described in the history of present illness. She was evaluated in the context of the global COVID-19 pandemic, which necessitated consideration that the patient might be at risk for infection with the SARS-CoV-2 virus that causes COVID-19. Institutional protocols and algorithms that pertain to the evaluation of patients at risk for COVID-19 are in a state of rapid change based on information released by regulatory bodies including the CDC and federal and state organizations. These policies and algorithms were followed during the patient's care in the ED.  Some ED evaluations and interventions may be delayed as a result of limited staffing during and after the pandemic.*  Note:  This document was prepared using Dragon voice recognition software and may include unintentional dictation errors.   Gregor Hams, MD 11/05/19 [phone]

## 2019-11-05 NOTE — ED Notes (Signed)
Per MD no need to cath for urine at this time

## 2019-11-05 NOTE — ED Notes (Addendum)
Dr Owens Shark updated to critical lactic acid level. No new orders.  Dr. Owens Shark states no fluids d/t no hypotension.

## 2019-11-05 NOTE — Consult Note (Signed)
[facility] Consult Note  MRN : [id number]  [name] is a 86 y.o. (26-Aug-1920) female who presents with chief complaint of  Chief Complaint  Patient presents with  . Leg Pain   History of Present Illness:  The patient is a 86 year old female with multiple medical issues (see below) known to our service as we have treated her lower extremity atherosclerotic disease in the past who presents to the [facility] emergency department from home reporting right leg pain.  Patient is a poor historian.  Information for this consult was obtained from previous epic notation by other team members.  Patient was brought to the emergency department by family members with a chief complaint of progressively worsening right lower extremity pain and wound formation.  On October 20, 2019, the patient underwent:  1. Ultrasound guidance for vascular access left femoral artery 2. Catheter placement into right common femoral artery from left femoral approach 3. Aortogram and selective right lower extremity angiogram including selective images of the right peroneal artery and right anterior tibial artery 4. Percutaneous transluminal angioplasty of proximal right peroneal artery and tibioperoneal trunk with 3 mm diameter by 22 cm length angioplasty balloon 5.  Percutaneous transluminal angioplasty of the right distal SFA and above-knee popliteal artery with 5 mm diameter by 15 cm length Lutonix drug-coated angioplasty balloon             6.  Percutaneous transluminal angioplasty of the right anterior tibial artery with 3 mm diameter angioplasty balloon proximally and 2 mm diameter angioplasty balloon into the foot 7. StarClose closure device right femoral artery  The patient was seen by podiatry who feels the right foot wound has progressed to the point that the foot is not salvageable.   Vascular surgery was consulted by Dr. Francine Graven for possible surgical intervention.  Current Facility-Administered Medications  Medication Dose Route Frequency Provider Last Rate Last Admin  . 0.45 % sodium chloride infusion   Intravenous Continuous Agbata, Tochukwu, MD 75 mL/hr at 11/05/19 1048 New Bag at 11/05/19 1048  . allopurinol (ZYLOPRIM) tablet 300 mg  300 mg Oral Daily Agbata, Tochukwu, MD      . amLODipine (NORVASC) tablet 5 mg  5 mg Oral Daily Agbata, Tochukwu, MD   5 mg at 11/05/19 1040  . atorvastatin (LIPITOR) tablet 10 mg  10 mg Oral Daily Agbata, Tochukwu, MD   10 mg at 11/05/19 1041  . cefTRIAXone (ROCEPHIN) 2 g in sodium chloride 0.9 % 100 mL IVPB  2 g Intravenous Q24H Agbata, Tochukwu, MD      . colchicine tablet 0.6 mg  0.6 mg Oral Daily Agbata, Tochukwu, MD      . docusate sodium (COLACE) capsule 200 mg  200 mg Oral BID Agbata, Tochukwu, MD   200 mg at 11/05/19 1040  . gabapentin (NEURONTIN) capsule 300 mg  300 mg Oral TID Agbata, Tochukwu, MD   300 mg at 11/05/19 1633  . heparin injection 5,000 Units  5,000 Units Subcutaneous Q8H Agbata, Tochukwu, MD   5,000 Units at 11/05/19 1633  . hydrALAZINE (APRESOLINE) tablet 50 mg  50 mg Oral BID Agbata, Tochukwu, MD   50 mg at 11/05/19 1040  . iron polysaccharides (NIFEREX) capsule 150 mg  150 mg Oral q morning - 10a Agbata, Tochukwu, MD      . metoprolol tartrate (LOPRESSOR) tablet 25 mg  25 mg Oral BID Agbata, Tochukwu, MD   25 mg at 11/05/19 1040  . metroNIDAZOLE (  FLAGYL) IVPB 500 mg  500 mg Intravenous Q8H Agbata, Tochukwu, MD   Stopped at 11/05/19 1335  . morphine 2 MG/ML injection 2 mg  2 mg Intravenous Q4H PRN Agbata, Tochukwu, MD   2 mg at 11/05/19 1039  . ondansetron (ZOFRAN) tablet 4 mg  4 mg Oral Q6H PRN Agbata, Tochukwu, MD       Or  . ondansetron (ZOFRAN) injection 4 mg  4 mg Intravenous Q6H PRN Agbata, Tochukwu, MD   4 mg at 11/05/19 1040  . pantoprazole (PROTONIX) EC tablet 40 mg  40 mg Oral Daily Agbata, Tochukwu, MD    40 mg at 11/05/19 1040  . sertraline (ZOLOFT) tablet 25 mg  25 mg Oral Q breakfast Agbata, Tochukwu, MD   25 mg at 11/05/19 1040  . [START ON 11/07/2019] vancomycin (VANCOREADY) IVPB 500 mg/100 mL  500 mg Intravenous Q48H Zeigler, Sandi Mealy, RPH       Current Outpatient Medications  Medication Sig Dispense Refill  . allopurinol (ZYLOPRIM) 300 MG tablet Take 300 mg by mouth daily.    Marland Kitchen amLODipine (NORVASC) 5 MG tablet Take 1 tablet (5 mg total) by mouth daily. 30 tablet 11  . aspirin EC 81 MG tablet Take 1 tablet (81 mg total) by mouth daily. 150 tablet 2  . atorvastatin (LIPITOR) 10 MG tablet Take 1 tablet (10 mg total) by mouth daily. 30 tablet 11  . clopidogrel (PLAVIX) 75 MG tablet Take 1 tablet (75 mg total) by mouth daily. 30 tablet 11  . COLCRYS 0.6 MG tablet Take 1 tablet by mouth daily as needed.    . diclofenac Sodium (VOLTAREN) 1 % GEL Apply 2 g topically 2 (two) times daily.    Marland Kitchen gabapentin (NEURONTIN) 300 MG capsule Take 300 mg by mouth 3 (three) times daily.    . hydrALAZINE (APRESOLINE) 50 MG tablet Take 50 mg by mouth 2 (two) times daily.     Marland Kitchen HYDROcodone-acetaminophen (NORCO) 7.5-325 MG tablet Take 1 tablet by mouth every 6 (six) hours as needed.    . hyoscyamine (LEVSIN SL) 0.125 MG SL tablet Place 0.125 mg under the tongue every 4 (four) hours as needed.    Marland Kitchen LORazepam (ATIVAN) 0.5 MG tablet Take 0.5 mg by mouth every 4 (four) hours as needed.    . metoprolol tartrate (LOPRESSOR) 25 MG tablet Take 25 mg by mouth 2 (two) times daily.    . Morphine Sulfate (MORPHINE CONCENTRATE) 10 mg / 0.5 ml concentrated solution Take 0.5 mLs by mouth every 4 (four) hours as needed for pain.    . pantoprazole (PROTONIX) 40 MG tablet Pantoprazole Sodium 40 MG Oral Tablet Delayed Release QTY: 30 each Days: 30 Refills: 2  Written: 07/05/18 Patient Instructions: TAKE 1 TABLET BY MOUTH DAILY    . POLY-IRON 150 FORTE 150-25-1 MG-MCG-MG CAPS Take 1 capsule by mouth every morning.    . sertraline  (ZOLOFT) 25 MG tablet Take 25 mg by mouth daily with breakfast.     . tiZANidine (ZANAFLEX) 2 MG tablet Take 4 mg by mouth at bedtime.    Marland Kitchen UNABLE TO FIND APPLY TWICE DAILY TO KNEE     Past Medical History:  Diagnosis Date  . Arthritis   . GERD (gastroesophageal reflux disease)   . Hypertension   . Neck fracture ([place]) 2015   C2   Past Surgical History:  Procedure Laterality Date  . ABDOMINAL HYSTERECTOMY    . CHOLECYSTECTOMY  08/06/14  . ESOPHAGOGASTRODUODENOSCOPY (EGD) WITH PROPOFOL N/A 08/03/2016  Procedure: ESOPHAGOGASTRODUODENOSCOPY (EGD) WITH PROPOFOL;  Surgeon: Ronald Lobo, MD;  Location: [facility];  Service: Gastroenterology;  Laterality: N/A;  . EYE SURGERY  2015   cataract  . LOWER EXTREMITY ANGIOGRAPHY Right 10/20/2019   Procedure: LOWER EXTREMITY ANGIOGRAPHY;  Surgeon: Algernon Huxley, MD;  Location: [facility];  Service: Cardiovascular;  Laterality: Right;   Social History Social History   Tobacco Use  . Smoking status: Never Smoker  . Smokeless tobacco: Never Used  Substance Use Topics  . Alcohol use: No    Alcohol/week: 0.0 standard drinks  . Drug use: No   Family History Family History  Problem Relation Age of Onset  . Rheumatologic disease Mother   No family history of peripheral artery disease venous disease or renal disease.  No Known Allergies  REVIEW OF SYSTEMS (Negative unless checked)  Constitutional: [] Weight loss  [] Fever  [] Chills Cardiac: [] Chest pain   [] Chest pressure   [] Palpitations   [] Shortness of breath when laying flat   [] Shortness of breath at rest   [] Shortness of breath with exertion. Vascular:  [] Pain in legs with walking   [x] Pain in legs at rest   [x] Pain in legs when laying flat   [] Claudication   [] Pain in feet when walking  [x] Pain in feet at rest  [x] Pain in feet when laying flat   [] History of DVT   [] Phlebitis   [] Swelling in legs   [] Varicose veins   [x] Non-healing ulcers Pulmonary:   [] Uses home oxygen    [] Productive cough   [] Hemoptysis   [] Wheeze  [] COPD   [] Asthma Neurologic:  [] Dizziness  [] Blackouts   [] Seizures   [] History of stroke   [] History of TIA  [] Aphasia   [] Temporary blindness   [] Dysphagia   [] Weakness or numbness in arms   [] Weakness or numbness in legs Musculoskeletal:  [] Arthritis   [] Joint swelling   [] Joint pain   [] Low back pain Hematologic:  [] Easy bruising  [] Easy bleeding   [] Hypercoagulable state   [] Anemic  [] Hepatitis Gastrointestinal:  [] Blood in stool   [] Vomiting blood  [] Gastroesophageal reflux/heartburn   [] Difficulty swallowing. Genitourinary:  [] Chronic kidney disease   [] Difficult urination  [] Frequent urination  [] Burning with urination   [] Blood in urine Skin:  [] Rashes   [] Ulcers   [] Wounds Psychological:  [] History of anxiety   []  History of major depression.  Physical Examination  Vitals:   11/05/19 0514 11/05/19 0518 11/05/19 0700 11/05/19 0951  BP: (!) 168/75  (!) 147/61 117/68  Pulse: 90  83 89  Resp: 16  (!) 25 18  Temp: 97.8 F (36.6 C)   99.9 F (37.7 C)  TempSrc: Oral   Oral  SpO2: 95%  100% 100%  Weight:  52.2 kg    Height:  5' (1.524 m)     Body mass index is 22.46 kg/m. Gen:  WD/WN, NAD Head: [place]/AT, No temporalis wasting. Prominent temp pulse not noted. Ear/Nose/Throat: Hearing grossly intact, nares w/o erythema or drainage, oropharynx w/o Erythema/Exudate Eyes: Sclera non-icteric, conjunctiva clear Neck: Trachea midline.  No JVD.  Pulmonary:  Good air movement, respirations not labored, equal bilaterally.  Cardiac: RRR, normal S1, S2. Vascular:  Vessel Right Left  Radial Palpable Palpable  Ulnar Palpable Palpable  Brachial Palpable Palpable  Carotid Palpable, without bruit Palpable, without bruit  Aorta Not palpable N/A  Femoral Palpable Palpable  Popliteal Palpable Palpable  PT Non-Palpable Non-Palpable  DP Non-Palpable Non-Palpable   Right lower extremity: Thigh soft.  Calf soft.  Unable to  palpate pedal pulses.   Gangrenous changes to tissue on the dorsal aspect of the right foot.  Gastrointestinal: soft, non-tender/non-distended. No guarding/reflex.  Musculoskeletal: M/S 5/5 throughout.  Extremities without ischemic changes.  No deformity or atrophy. No edema. Neurologic: Sensation grossly intact in extremities.  Symmetrical.  Speech is fluent. Motor exam as listed above. Psychiatric: Judgment intact, Mood & affect appropriate for pt's clinical situation. Dermatologic: As above Lymph : No Cervical, Axillary, or Inguinal lymphadenopathy.  CBC Lab Results  Component Value Date   WBC 16.3 (H) 11/05/2019   HGB 9.3 (L) 11/05/2019   HCT 27.8 (L) 11/05/2019   MCV 78.3 (L) 11/05/2019   PLT 544 (H) 11/05/2019   BMET    Component Value Date/Time   NA 141 11/05/2019 0531   NA 146 (H) 08/03/2014 1006   NA 140 06/13/2014 1943   K 4.7 11/05/2019 0531   K 4.0 06/13/2014 1943   CL 104 11/05/2019 0531   CL 101 06/13/2014 1943   CO2 25 11/05/2019 0531   CO2 33 (H) 06/13/2014 1943   GLUCOSE 111 (H) 11/05/2019 0531   GLUCOSE 99 06/13/2014 1943   BUN 45 (H) 11/05/2019 0531   BUN 8 (L) 08/03/2014 1006   BUN 14 06/13/2014 1943   CREATININE 1.52 (H) 11/05/2019 0531   CREATININE 1.10 06/13/2014 1943   CALCIUM 9.2 11/05/2019 0531   CALCIUM 9.0 06/13/2014 1943   GFRNONAA 28 (L) 11/05/2019 0531   GFRNONAA 49 (L) 06/13/2014 1943   GFRAA 33 (L) 11/05/2019 0531   GFRAA 60 (L) 06/13/2014 1943   Estimated Creatinine Clearance: 15.2 mL/min (A) (by C-G formula based on SCr of 1.52 mg/dL (H)).  COAG Lab Results  Component Value Date   INR 1.2 11/05/2019   INR 1.07 08/01/2016   Radiology PERIPHERAL VASCULAR CATHETERIZATION  Result Date: 10/20/2019 See op note  DG Foot Complete Right  Result Date: 11/05/2019 CLINICAL DATA:  Right leg pain.  Osteomyelitis. EXAM: RIGHT FOOT COMPLETE - 3+ VIEW COMPARISON:  Right foot radiographs 09/11/2019 FINDINGS: Bone is scratched at the foot is diffusely demineralized.  No focal erosive lesions are present. Microvascular calcifications are evident. No acute or healing fracture is present. IMPRESSION: 1. No acute or healing fracture. 2. Diffuse demineralization. 3. Microvascular calcifications suggesting diabetes. 4. No focal osseous erosion. While osteomyelitis is not identified, it is not excluded. Electronically Signed   By: San Morelle M.D.   On: 11/05/2019 07:21   Assessment/Plan The patient is a 86 year old female well-known to our service as we have treated her in the past for peripheral artery disease.  Patient presents to the [facility] emergency department with a chief complaint of progressively worsening right foot pain  1.  Gangrenous changes to the right foot: Patient with known history of atherosclerotic disease to the right lower extremity.  Most recent intervention on October 20, 2019.  Patient underwent a right lower extremity angiogram with intervention.  Unfortunately, the patient presents today with significant worsening in her right foot wound.  Patient was seen by podiatry who has assessed the wound and the foot to be unsalvageable.  Vascular surgery was consulted for surgical intervention.  Due to the patient's age, multiple medical issues and significant deterioration in her right lower extremity there has been a notable deterioration in her ability to ambulate.  A below the knee amputation would present a greater risk of complication due to contracture.  Without the ability to ambulate after surgery the patient would most likely experiencing  contracture and breakdown of the below the knee amputation stump.  To avoid this, in the setting of severe sepsis would recommend the patient undergo an above-the-knee amputation.  Due to the patient's baseline confusion, patient unable to consent at this time.  I have reached out to the family number on file [name].  I have called both her home number and cell number.  I was  unable to leave a message on the voicemail due to being full.  2.  Chronic kidney disease: Creatinine today 1.52 We will continue to monitor in the setting of sepsis.  3. Hypertension: On appropriate medications. Encouraged good control as its slows the progression of atherosclerotic disease  Discussed with Dr. Mayme Genta, PA-C  11/05/2019 5:39 PM  This note was created with Dragon medical transcription system.  Any error is purely unintentional

## 2019-11-05 NOTE — Progress Notes (Signed)
CODE SEPSIS - PHARMACY COMMUNICATION  **Broad Spectrum Antibiotics should be administered within 1 hour of Sepsis diagnosis**  Time Code Sepsis Called/Page Received: 0712  Antibiotics Ordered: Vancomycin/Ceftriaxone  Time of 1st antibiotic administration: 0606  Additional action taken by pharmacy: none  If necessary, Name of Provider/Nurse Contacted: [place] ,PharmD Clinical Pharmacist  11/05/2019  7:25 AM

## 2019-11-05 NOTE — ED Notes (Signed)
Pt's urine was collected via catheter for urine culture. Unable to edit collection.

## 2019-11-05 NOTE — ED Notes (Addendum)
Refusing to allow RN to assess affected foot. Foul odor noted.

## 2019-11-05 NOTE — ED Triage Notes (Signed)
Pt to ED from home reporting right leg pain but pt is not answering many questions and is groaning with pain and teaful upon arrival. Pt has hx of right toe complications that family reports was potentially going to be amputated. Foot wrapped upon arrival. Pt is confused at baseline but family was unable to elaborate on how confused.   Pt also has a dressing to the left groin but unknown reason for dressing.

## 2019-11-05 NOTE — ED Notes (Signed)
Pt spoke with daughter on the phone

## 2019-11-05 NOTE — Progress Notes (Signed)
Addendum to consent:  I spoke with Ms. [name] by phone.  She is the patient's daughter.  The contact that was listed in the computer is a nonrelative.  After going over [name] situation and the gangrenous changes to the entire dorsum of the foot up to the ankle as well as the reasons the patient was admitted which was for sepsis with lactic acidosis and leukocytosis as well as mental status changes [name] had a much better understanding of the gravity of the situation.  I also discussed comfort care measures and hospice with [name].  [name] expressed that she wants [place] to do what ever it takes to keep her mother alive and once we had reviewed the situation she agrees without reservation that we proceed with amputation.  After this discussion I feel comfortable with proceeding with our plan Dr. Lucky Cowboy will perform a right above-knee amputation tomorrow or surgery time is scheduled for 730.  [name] was aware of this.

## 2019-11-05 NOTE — ED Notes (Signed)
Pt daughter is at the bedside.

## 2019-11-05 NOTE — Progress Notes (Signed)
Pharmacy Antibiotic Note  [name] is a 86 y.o. female admitted on 11/05/2019 with ischemic/gangrenous Right foot .  Pharmacy has been consulted for vancomycin dosing. Patient also receiving ceftriaxone and metronidazole.  Podiatry performed debridement of necrotizing tissue on 5/11 in office,  Cultures at that time reveal Pan-susceptible E. Coli and P. Mirabilis.    Today, 11/05/2019  Received vancomycin 1gm and ceftriaxone 1gm in ED today  CKD - Scr 1.52 today  Afebrile  WBC elevated  Plan:  Vancomycin 500 IV every q48 hours.  Goal trough 10-15 mcg/mL. - May increase trough goal if likelihood of osteomyelitis is more definitive  Ceftriaxone 2gm IV Q24H and metronidazole 500mg  IV q8h  Monitor renal function closely  Await surgical plan  Height: 5' (152.4 cm) Weight: 52.2 kg (115 lb) IBW/kg (Calculated) : 45.5  Temp (24hrs), Avg:97.8 F (36.6 C), Min:97.8 F (36.6 C), Max:97.8 F (36.6 C)  Recent Labs  Lab 11/05/19 0531 11/05/19 0715  WBC 16.3*  --   CREATININE 1.52*  --   LATICACIDVEN 3.1* 1.7    Estimated Creatinine Clearance: 15.2 mL/min (A) (by C-G formula based on SCr of 1.52 mg/dL (H)).    No Known Allergies  Antimicrobials this admission: 6/23 Vancomycin >> 6/23 metronidazole >> 6/23 ceftriaxone >>  Dose adjustments this admission:  Microbiology results: 7/23 BCx: pending  Thank you for allowing pharmacy to be a part of this patient's care.  Doreene Eland, PharmD, BCPS.   Work Cell: [phone] 11/05/2019 9:15 AM

## 2019-11-05 NOTE — ED Notes (Signed)
Pt crying out c/o abd pain.

## 2019-11-05 NOTE — Progress Notes (Signed)
Contacted by [name] [phone]), states she is patients daughter. She said she was told by her brother her mother was going to have her leg cut off in the morning. Ms. [name] stated "if you cut off my momma's leg, I will sue the hospital". She was upset a physician had not spoken with her. Explained we had made multiple attempts to contact her. She stated she wanted to speak with someone about her mother. Spoke with Dr. Delana Meyer, requested he speak with [name].

## 2019-11-05 NOTE — ED Notes (Signed)
purewick placed on patient. Pt clean and dry. Pt stiffens up and becomes difficult to move around in bed when staff assisting. Refusing to roll back and forth despite multiple explanation about what we are doing to prevent urinary incontinence to breakdown skin.

## 2019-11-05 NOTE — H&P (Addendum)
History and Physical    [name] OQH:[id number] DOB: [birth year] DOA: 11/05/2019  PCP: Jodi Marble, MD   Patient coming from: Home  I have personally briefly reviewed patient's old medical records in [place]  Chief Complaint: Right leg pain Patient is a poor historian most of the history was obtained from medical records  HPI: [name] is a 86 y.o. female with medical history significant for peripheral vascular disease, chronic kidney disease, arthritis, GERD and hypertension who presents to the emergency room for evaluation of severe pain involving the right leg.  Patient admitted to have ulceration involving the right first and second toe webspace and in the proximal foot. Patient was noted to have a right first webspace ulceration about 6 weeks ago and was seen by podiatry on 09/23/19 where she had excisional debridement of eschar on the dorsum of the first webspace extending proximally.  Patient had arterial Dopplers which were consistent with severe peripheral arterial disease and she was referred to vascular surgery and status post angioplasty of the proximal right peroneal artery and tibioperoneal trunk.  Angioplasty of the right distal SFA, above-knee popliteal and angioplasty of the right anterior tibial artery. She was brought into the emergency room by family members for evaluation of severe pain in her right leg.  Patient is unable to provide any history and I am unable to do review of systems. Labs reveal a lactate of 3.1, white cell count of 16,000 with a left shift Right foot x-ray showed microvascular calcifications suggesting diabetes. No focal osseous erosion. While osteomyelitis is not identified, it is not excluded.  ED Course: Elderly female seen in the emergency room for evaluation of severe right lower extremity pain.  Patient is status post recent angioplasty of the right lower extremity and has gangrene of the right foot with ischemic pain.  Lactic  acid was elevated at 3.1 and she has a white count of 16,000.  Patient received IV vancomycin and Rocephin in the emergency room and will be admitted to the hospital for further evaluation.  Review of Systems: As per HPI otherwise 10 point review of systems negative.    Past Medical History:  Diagnosis Date  . Arthritis   . GERD (gastroesophageal reflux disease)   . Hypertension   . Neck fracture ([place]) 2015   C2    Past Surgical History:  Procedure Laterality Date  . ABDOMINAL HYSTERECTOMY    . CHOLECYSTECTOMY  08/06/14  . ESOPHAGOGASTRODUODENOSCOPY (EGD) WITH PROPOFOL N/A 08/03/2016   Procedure: ESOPHAGOGASTRODUODENOSCOPY (EGD) WITH PROPOFOL;  Surgeon: Ronald Lobo, MD;  Location: [facility];  Service: Gastroenterology;  Laterality: N/A;  . EYE SURGERY  2015   cataract  . LOWER EXTREMITY ANGIOGRAPHY Right 10/20/2019   Procedure: LOWER EXTREMITY ANGIOGRAPHY;  Surgeon: Algernon Huxley, MD;  Location: [facility];  Service: Cardiovascular;  Laterality: Right;     reports that she has never smoked. She has never used smokeless tobacco. She reports that she does not drink alcohol and does not use drugs.  No Known Allergies  Family History  Problem Relation Age of Onset  . Rheumatologic disease Mother      Prior to Admission medications   Medication Sig Start Date End Date Taking? Authorizing Provider  allopurinol (ZYLOPRIM) 300 MG tablet Take 300 mg by mouth daily.    [provider]  amLODipine (NORVASC) 5 MG tablet Take 1 tablet (5 mg total) by mouth daily. 04/26/19 04/25/20  Kathie Dike, MD  aspirin EC 81 MG tablet Take 1 tablet (81 mg total) by mouth daily. 10/20/19   Algernon Huxley, MD  atorvastatin (LIPITOR) 10 MG tablet Take 1 tablet (10 mg total) by mouth daily. 10/20/19 10/19/20  Algernon Huxley, MD  clopidogrel (PLAVIX) 75 MG tablet Take 1 tablet (75 mg total) by mouth daily. 10/20/19   Algernon Huxley, MD  COLCRYS 0.6 MG tablet Take 1 tablet by mouth daily as  needed. 04/14/19   [provider]  diclofenac Sodium (VOLTAREN) 1 % GEL Apply 2 g topically 2 (two) times daily. 04/09/19   [provider]  docusate sodium (COLACE) 100 MG capsule Docusate Sodium 100 MG Oral Capsule QTY: 0 capsule Days: 0 Refills: 0  Written: 06/01/15 Patient Instructions: 06/01/15   [provider]  doxycycline (VIBRA-TABS) 100 MG tablet Take 1 tablet (100 mg total) by mouth 2 (two) times daily. 09/18/19   [name], DPM  folic acid (FOLVITE) 1 MG tablet Folic Acid 1 MG Oral Tablet QTY: 90 each Days: 90 Refills: 0  Written: 04/19/18 Patient Instructions: TAKE ONE TABLET BY MOUTH EVERY DAY 04/19/18   [provider]  gabapentin (NEURONTIN) 300 MG capsule Take 300 mg by mouth 3 (three) times daily. 10/10/19   [provider]  hydrALAZINE (APRESOLINE) 50 MG tablet Take 50 mg by mouth 2 (two) times daily.     [provider]  HYDROcodone-acetaminophen (NORCO) 7.5-325 MG tablet Take 1 tablet by mouth every 6 (six) hours as needed. 10/10/19   [provider]  HYDROcodone-acetaminophen (NORCO/VICODIN) 5-325 MG tablet Take 1 tablet by mouth 2 (two) times daily.     [provider]  metoprolol tartrate (LOPRESSOR) 25 MG tablet Take 25 mg by mouth 2 (two) times daily. 07/11/19   [provider]  pantoprazole (PROTONIX) 40 MG tablet Pantoprazole Sodium 40 MG Oral Tablet Delayed Release QTY: 30 each Days: 30 Refills: 2  Written: 07/05/18 Patient Instructions: TAKE 1 TABLET BY MOUTH DAILY 07/05/18   [provider]  POLY-IRON 150 FORTE 150-25-1 MG-MCG-MG CAPS Take 1 capsule by mouth every morning. 03/28/19   [provider]  sertraline (ZOLOFT) 25 MG tablet Take 25 mg by mouth daily with breakfast.     [provider]  tiZANidine (ZANAFLEX) 2 MG tablet Take 4 mg by mouth at bedtime.    [provider]  UNABLE TO FIND APPLY TWICE DAILY TO KNEE 01/31/19   [provider]     Physical Exam: Vitals:   11/05/19 0514 11/05/19 0518 11/05/19 0700  BP: (!) 168/75  (!) 147/61  Pulse: 90  83  Resp: 16  (!) 25  Temp: 97.8 F (36.6 C)    TempSrc: Oral    SpO2: 95%  100%  Weight:  52.2 kg   Height:  5' (1.524 m)      Vitals:   11/05/19 0514 11/05/19 0518 11/05/19 0700  BP: (!) 168/75  (!) 147/61  Pulse: 90  83  Resp: 16  (!) 25  Temp: 97.8 F (36.6 C)    TempSrc: Oral    SpO2: 95%  100%  Weight:  52.2 kg   Height:  5' (1.524 m)     Constitutional: NAD, alert and oriented only to person.  Noted to be in severe pain Eyes: PERRL, lids and conjunctivae pallor ENMT: Mucous membranes are moist.  Neck: normal, supple, no masses, no thyromegaly Respiratory: clear to auscultation bilaterally, no wheezing, no crackles. Normal respiratory effort. No accessory  muscle use.  Cardiovascular: Regular rate and rhythm, no murmurs / rubs / gallops. No extremity edema. 2+ pedal pulses. No carotid bruits.  Abdomen: no tenderness, no masses palpated. No hepatosplenomegaly. Bowel sounds positive.  Musculoskeletal: no clubbing / cyanosis. No joint deformity upper and lower extremities.  Gangrene involving the dorsum of the right foot with ulceration in the first and second toe webspace Skin: no rashes, lesions, ulcers.  Neurologic: No gross focal neurologic deficit.  Generalized weakness Psychiatric: Normal mood and affect.   Labs on Admission: I have personally reviewed following labs and imaging studies  CBC: Recent Labs  Lab 11/05/19 0531  WBC 16.3*  NEUTROABS 12.9*  HGB 9.3*  HCT 27.8*  MCV 78.3*  PLT 601*   Basic Metabolic Panel: Recent Labs  Lab 11/05/19 0531  NA 141  K 4.7  CL 104  CO2 25  GLUCOSE 111*  BUN 45*  CREATININE 1.52*  CALCIUM 9.2   GFR: Estimated Creatinine Clearance: 15.2 mL/min (A) (by C-G formula based on SCr of 1.52 mg/dL (H)). Liver Function Tests: Recent Labs  Lab 11/05/19 0531  AST 27  ALT 24  ALKPHOS 70  BILITOT  0.6  PROT 8.8*  ALBUMIN 2.9*   No results for input(s): LIPASE, AMYLASE in the last 168 hours. No results for input(s): AMMONIA in the last 168 hours. Coagulation Profile: Recent Labs  Lab 11/05/19 0531  INR 1.2   Cardiac Enzymes: No results for input(s): CKTOTAL, CKMB, CKMBINDEX, TROPONINI in the last 168 hours. BNP (last 3 results) No results for input(s): PROBNP in the last 8760 hours. HbA1C: No results for input(s): HGBA1C in the last 72 hours. CBG: No results for input(s): GLUCAP in the last 168 hours. Lipid Profile: No results for input(s): CHOL, HDL, LDLCALC, TRIG, CHOLHDL, LDLDIRECT in the last 72 hours. Thyroid Function Tests: No results for input(s): TSH, T4TOTAL, FREET4, T3FREE, THYROIDAB in the last 72 hours. Anemia Panel: No results for input(s): VITAMINB12, FOLATE, FERRITIN, TIBC, IRON, RETICCTPCT in the last 72 hours. Urine analysis:    Component Value Date/Time   COLORURINE YELLOW (A) 04/21/2019 2247   APPEARANCEUR HAZY (A) 04/21/2019 2247   LABSPEC 1.019 04/21/2019 2247   PHURINE 5.0 04/21/2019 [place] 04/21/2019 2247   HGBUR NEGATIVE 04/21/2019 2247   BILIRUBINUR NEGATIVE 04/21/2019 2247   KETONESUR 5 (A) 04/21/2019 2247   PROTEINUR NEGATIVE 04/21/2019 2247   NITRITE NEGATIVE 04/21/2019 2247   LEUKOCYTESUR NEGATIVE 04/21/2019 2247    Radiological Exams on Admission: DG Foot Complete Right  Result Date: 11/05/2019 CLINICAL DATA:  Right leg pain.  Osteomyelitis. EXAM: RIGHT FOOT COMPLETE - 3+ VIEW COMPARISON:  Right foot radiographs 09/11/2019 FINDINGS: Bone is scratched at the foot is diffusely demineralized. No focal erosive lesions are present. Microvascular calcifications are evident. No acute or healing fracture is present. IMPRESSION: 1. No acute or healing fracture. 2. Diffuse demineralization. 3. Microvascular calcifications suggesting diabetes. 4. No focal osseous erosion. While osteomyelitis is not identified, it is not excluded.  Electronically Signed   By: San Morelle M.D.   On: 11/05/2019 07:21    EKG: Independently reviewed.   Normal sinus rhythm  Assessment/Plan Principal Problem:   Gangrene of right foot (HCC) Active Problems:   HTN (hypertension)   CKD (chronic kidney disease) stage 4, GFR 15-29 ml/min (HCC)   Depression   Cognitive impairment   Atherosclerosis of native arteries of the extremities with ulceration (HCC)   Gangrene due to peripheral vascular disease ([place])  Gangrene of right foot with associated cellulitis And severe ischemic pain Patient with known severe peripheral vascular disease status post recent angioplasty Lactic acid on admission was 2.1 and she has a white count of 16,000 with a left shift Place patient on empiric antibiotic therapy with vancomycin and meropenem adjusted to renal function Wound culture from 09/23/19 yielded pansensitive E. coli and Proteus Will request podiatry and vascular surgery consult   Severe peripheral vascular disease Status post recent stent angioplasty and now presents with gangrene involving the right foot We will hold aspirin and Plavix for possible procedure  ??  Amputation Continue statins and beta-blockers   Hypertension Continue amlodipine, hydralazine and metoprolol   Depression Continue Zoloft   Chronic kidney disease stage IV We will monitor renal function closely during this hospitalization and will request nephrology consult if any worsening   DVT prophylaxis: Heparin Code Status: Full code Family Communication: Called patient's emergency contact Nona Dell, unable to leave a voice message Disposition Plan: Back to previous home environment Consults called: Vascular surgery, podiatry    Collier Bullock MD [facility]     11/05/2019, 8:34 AM

## 2019-11-06 ENCOUNTER — Encounter
Admission: EM | Disposition: A | Discharge status: Skilled Nursing Facility | Payer: Self-pay | Source: Home / Self Care | Attending: Hospitalist

## 2019-11-06 ENCOUNTER — Inpatient Hospital Stay: Payer: Medicare Other | Admitting: Anesthesiology

## 2019-11-06 ENCOUNTER — Encounter: Payer: Self-pay | Admitting: Internal Medicine

## 2019-11-06 DIAGNOSIS — Z90710 Acquired absence of both cervix and uterus: Secondary | ICD-10-CM | POA: Diagnosis not present

## 2019-11-06 DIAGNOSIS — I96 Gangrene, not elsewhere classified: Secondary | ICD-10-CM

## 2019-11-06 DIAGNOSIS — M109 Gout, unspecified: Secondary | ICD-10-CM | POA: Diagnosis present

## 2019-11-06 DIAGNOSIS — B9620 Unspecified Escherichia coli [E. coli] as the cause of diseases classified elsewhere: Secondary | ICD-10-CM | POA: Diagnosis present

## 2019-11-06 DIAGNOSIS — Z7982 Long term (current) use of aspirin: Secondary | ICD-10-CM | POA: Diagnosis not present

## 2019-11-06 DIAGNOSIS — I70261 Atherosclerosis of native arteries of extremities with gangrene, right leg: Secondary | ICD-10-CM | POA: Diagnosis present

## 2019-11-06 DIAGNOSIS — L03115 Cellulitis of right lower limb: Secondary | ICD-10-CM | POA: Diagnosis present

## 2019-11-06 DIAGNOSIS — R4189 Other symptoms and signs involving cognitive functions and awareness: Secondary | ICD-10-CM | POA: Diagnosis present

## 2019-11-06 DIAGNOSIS — L97519 Non-pressure chronic ulcer of other part of right foot with unspecified severity: Secondary | ICD-10-CM | POA: Diagnosis present

## 2019-11-06 DIAGNOSIS — D62 Acute posthemorrhagic anemia: Secondary | ICD-10-CM | POA: Diagnosis not present

## 2019-11-06 DIAGNOSIS — N179 Acute kidney failure, unspecified: Secondary | ICD-10-CM | POA: Diagnosis present

## 2019-11-06 DIAGNOSIS — I739 Peripheral vascular disease, unspecified: Secondary | ICD-10-CM | POA: Diagnosis present

## 2019-11-06 DIAGNOSIS — Z7902 Long term (current) use of antithrombotics/antiplatelets: Secondary | ICD-10-CM | POA: Diagnosis not present

## 2019-11-06 DIAGNOSIS — I129 Hypertensive chronic kidney disease with stage 1 through stage 4 chronic kidney disease, or unspecified chronic kidney disease: Secondary | ICD-10-CM | POA: Diagnosis present

## 2019-11-06 DIAGNOSIS — Z79891 Long term (current) use of opiate analgesic: Secondary | ICD-10-CM | POA: Diagnosis not present

## 2019-11-06 DIAGNOSIS — N1831 Chronic kidney disease, stage 3a: Secondary | ICD-10-CM | POA: Diagnosis present

## 2019-11-06 DIAGNOSIS — F329 Major depressive disorder, single episode, unspecified: Secondary | ICD-10-CM | POA: Diagnosis present

## 2019-11-06 DIAGNOSIS — Z9049 Acquired absence of other specified parts of digestive tract: Secondary | ICD-10-CM | POA: Diagnosis not present

## 2019-11-06 DIAGNOSIS — K219 Gastro-esophageal reflux disease without esophagitis: Secondary | ICD-10-CM | POA: Diagnosis present

## 2019-11-06 DIAGNOSIS — D631 Anemia in chronic kidney disease: Secondary | ICD-10-CM | POA: Diagnosis present

## 2019-11-06 DIAGNOSIS — Z20822 Contact with and (suspected) exposure to covid-19: Secondary | ICD-10-CM | POA: Diagnosis present

## 2019-11-06 DIAGNOSIS — Z79899 Other long term (current) drug therapy: Secondary | ICD-10-CM | POA: Diagnosis not present

## 2019-11-06 HISTORY — PX: AMPUTATION: SHX166

## 2019-11-06 LAB — APTT: aPTT: <24 seconds — ABNORMAL LOW (ref 24–36)

## 2019-11-06 LAB — CBC
HCT: 27.8 % — ABNORMAL LOW (ref 36.0–46.0)
Hemoglobin: 9.3 g/dL — ABNORMAL LOW (ref 12.0–15.0)
MCH: 26.4 pg (ref 26.0–34.0)
MCHC: 33.5 g/dL (ref 30.0–36.0)
MCV: 79 fL — ABNORMAL LOW (ref 80.0–100.0)
Platelets: 480 10*3/uL — ABNORMAL HIGH (ref 150–400)
RBC: 3.52 MIL/uL — ABNORMAL LOW (ref 3.87–5.11)
RDW: 15.5 % (ref 11.5–15.5)
WBC: 17.2 10*3/uL — ABNORMAL HIGH (ref 4.0–10.5)
nRBC: 0 % (ref 0.0–0.2)

## 2019-11-06 LAB — BASIC METABOLIC PANEL
Anion gap: 8 (ref 5–15)
BUN: 34 mg/dL — ABNORMAL HIGH (ref 8–23)
CO2: 26 mmol/L (ref 22–32)
Calcium: 8.5 mg/dL — ABNORMAL LOW (ref 8.9–10.3)
Chloride: 103 mmol/L (ref 98–111)
Creatinine, Ser: 1.41 mg/dL — ABNORMAL HIGH (ref 0.44–1.00)
GFR calc Af Amer: 36 mL/min — ABNORMAL LOW (ref >60)
GFR calc non Af Amer: 31 mL/min — ABNORMAL LOW (ref >60)
Glucose, Bld: 95 mg/dL (ref 70–99)
Potassium: 4.8 mmol/L (ref 3.5–5.1)
Sodium: 137 mmol/L (ref 135–145)

## 2019-11-06 LAB — PROTIME-INR
INR: 1.2 (ref 0.8–1.2)
Prothrombin Time: 14.9 seconds (ref 11.4–15.2)

## 2019-11-06 LAB — MAGNESIUM: Magnesium: 1.9 mg/dL (ref 1.7–2.4)

## 2019-11-06 SURGERY — AMPUTATION, ABOVE KNEE
Anesthesia: General | Site: Knee | Laterality: Right

## 2019-11-06 MED ORDER — PHENYLEPHRINE HCL (PRESSORS) 10 MG/ML IV SOLN
INTRAVENOUS | Status: DC | PRN
  Administered 2019-11-06: 100 ug via INTRAVENOUS

## 2019-11-06 MED ORDER — FENTANYL CITRATE (PF) 100 MCG/2ML IJ SOLN
INTRAMUSCULAR | Status: DC | PRN
  Administered 2019-11-06: 50 ug via INTRAVENOUS
  Administered 2019-11-06 (×4): 25 ug via INTRAVENOUS

## 2019-11-06 MED ORDER — CEFAZOLIN SODIUM-DEXTROSE 1-4 GM/50ML-% IV SOLN
1.0000 g | INTRAVENOUS | Status: AC
  Administered 2019-11-06: 1 g via INTRAVENOUS
  Filled 2019-11-06: qty 50

## 2019-11-06 MED ORDER — PROPOFOL 10 MG/ML IV BOLUS
INTRAVENOUS | Status: DC | PRN
  Administered 2019-11-06: 50 mg via INTRAVENOUS
  Administered 2019-11-06: 30 mg via INTRAVENOUS

## 2019-11-06 MED ORDER — CHLORHEXIDINE GLUCONATE CLOTH 2 % EX PADS
6.0000 | MEDICATED_PAD | Freq: Once | CUTANEOUS | Status: AC
  Administered 2019-11-06: 6 via TOPICAL

## 2019-11-06 MED ORDER — FENTANYL CITRATE (PF) 100 MCG/2ML IJ SOLN
INTRAMUSCULAR | Status: AC
  Filled 2019-11-06: qty 2

## 2019-11-06 MED ORDER — PHENYLEPHRINE HCL (PRESSORS) 10 MG/ML IV SOLN
INTRAVENOUS | Status: AC
  Filled 2019-11-06: qty 1

## 2019-11-06 MED ORDER — COLCHICINE 0.6 MG PO TABS: 0.6000 mg | ORAL_TABLET | Freq: Every day | ORAL | Status: DC | PRN

## 2019-11-06 MED ORDER — ONDANSETRON HCL 4 MG/2ML IJ SOLN
INTRAMUSCULAR | Status: DC | PRN
  Administered 2019-11-06: 4 mg via INTRAVENOUS

## 2019-11-06 MED ORDER — LACTATED RINGERS IV SOLN: INTRAVENOUS | Status: DC | PRN

## 2019-11-06 MED ORDER — ONDANSETRON HCL 4 MG/2ML IJ SOLN: 4.0000 mg | Freq: Once | INTRAMUSCULAR | Status: DC | PRN

## 2019-11-06 MED ORDER — PROPOFOL 10 MG/ML IV BOLUS
INTRAVENOUS | Status: AC
  Filled 2019-11-06: qty 20

## 2019-11-06 MED ORDER — LIDOCAINE HCL (PF) 2 % IJ SOLN
INTRAMUSCULAR | Status: AC
  Filled 2019-11-06: qty 5

## 2019-11-06 MED ORDER — LIDOCAINE HCL (CARDIAC) PF 100 MG/5ML IV SOSY
PREFILLED_SYRINGE | INTRAVENOUS | Status: DC | PRN
  Administered 2019-11-06: 50 mg via INTRAVENOUS

## 2019-11-06 MED ORDER — FENTANYL CITRATE (PF) 100 MCG/2ML IJ SOLN: 25.0000 ug | INTRAMUSCULAR | Status: DC | PRN

## 2019-11-06 MED ORDER — CHLORHEXIDINE GLUCONATE CLOTH 2 % EX PADS
6.0000 | MEDICATED_PAD | Freq: Every day | CUTANEOUS | Status: DC
  Administered 2019-11-06 – 2019-11-07 (×2): 6 via TOPICAL

## 2019-11-06 MED ORDER — GLYCOPYRROLATE 0.2 MG/ML IJ SOLN
INTRAMUSCULAR | Status: AC
  Filled 2019-11-06: qty 1

## 2019-11-06 MED ORDER — HYDROCODONE-ACETAMINOPHEN 5-325 MG PO TABS
1.0000 | ORAL_TABLET | Freq: Four times a day (QID) | ORAL | Status: DC | PRN
  Administered 2019-11-07: 2 via ORAL
  Filled 2019-11-06: qty 2

## 2019-11-06 SURGICAL SUPPLY — 36 items
BLADE SAGITTAL WIDE XTHICK NO (BLADE) ×2 IMPLANT
BNDG COHESIVE 4X5 TAN STRL (GAUZE/BANDAGES/DRESSINGS) ×2 IMPLANT
BNDG ELASTIC 6X5.8 VLCR NS LF (GAUZE/BANDAGES/DRESSINGS) ×2 IMPLANT
BNDG GAUZE 4.5X4.1 6PLY STRL (MISCELLANEOUS) ×4 IMPLANT
BRUSH SCRUB EZ  4% CHG (MISCELLANEOUS) ×1
BRUSH SCRUB EZ 4% CHG (MISCELLANEOUS) ×1 IMPLANT
CANISTER SUCT 1200ML W/VALVE (MISCELLANEOUS) ×2 IMPLANT
CHLORAPREP W/TINT 26ML (MISCELLANEOUS) ×2 IMPLANT
COVER WAND RF STERILE (DRAPES) ×2 IMPLANT
DRAPE INCISE IOBAN 66X45 STRL (DRAPES) ×2 IMPLANT
DRAPE INCISE IOBAN 66X60 STRL (DRAPES) ×2 IMPLANT
ELECT CAUTERY BLADE 6.4 (BLADE) ×2 IMPLANT
ELECT REM PT RETURN 9FT ADLT (ELECTROSURGICAL) ×2
ELECTRODE REM PT RTRN 9FT ADLT (ELECTROSURGICAL) ×1 IMPLANT
GAUZE XEROFORM 1X8 LF (GAUZE/BANDAGES/DRESSINGS) ×4 IMPLANT
GLOVE BIO SURGEON STRL SZ7 (GLOVE) ×6 IMPLANT
GLOVE INDICATOR 7.5 STRL GRN (GLOVE) ×4 IMPLANT
GOWN STRL REUS W/ TWL LRG LVL3 (GOWN DISPOSABLE) ×1 IMPLANT
GOWN STRL REUS W/ TWL XL LVL3 (GOWN DISPOSABLE) ×2 IMPLANT
GOWN STRL REUS W/TWL LRG LVL3 (GOWN DISPOSABLE) ×2
GOWN STRL REUS W/TWL XL LVL3 (GOWN DISPOSABLE) ×2
HANDLE YANKAUER SUCT BULB TIP (MISCELLANEOUS) ×2 IMPLANT
KIT TURNOVER KIT A (KITS) ×2 IMPLANT
LABEL OR SOLS (LABEL) ×2 IMPLANT
NS IRRIG 1000ML POUR BTL (IV SOLUTION) ×2 IMPLANT
PACK EXTREMITY (MISCELLANEOUS) ×2 IMPLANT
PAD ABD DERMACEA PRESS 5X9 (GAUZE/BANDAGES/DRESSINGS) ×4 IMPLANT
PAD PREP 24X41 OB/GYN DISP (PERSONAL CARE ITEMS) ×2 IMPLANT
SPONGE LAP 18X18 RF (DISPOSABLE) ×4 IMPLANT
STAPLER SKIN PROX 35W (STAPLE) ×2 IMPLANT
STOCKINETTE M/LG 89821 (MISCELLANEOUS) ×2 IMPLANT
SUT SILK 2 0 (SUTURE) ×1
SUT SILK 2 0 SH (SUTURE) ×4 IMPLANT
SUT SILK 2-0 18XBRD TIE 12 (SUTURE) ×1 IMPLANT
SUT VIC AB 0 CT1 36 (SUTURE) ×6 IMPLANT
SUT VIC AB 2-0 CT1 (SUTURE) ×4 IMPLANT

## 2019-11-06 NOTE — Progress Notes (Signed)
Pharmacy Antibiotic Note  [name] is a 86 y.o. female admitted on 11/05/2019 with ischemic/gangrenous Right foot .  Pharmacy has been consulted for vancomycin dosing. Patient also receiving ceftriaxone and metronidazole.  Podiatry performed debridement of necrotizing tissue on 5/11 in office,  Cultures at that time reveal Pan-susceptible E. Coli and P. Mirabilis.    Today, 11/06/2019  CKD - Scr 1.52 >> 1.41  Tmax 99.9 F last 24h  WBC elevated, 16.3 >> 17.2  S/p right AKA by vascular  Plan:  Vancomycin 500 IV every q48 hours.  Goal trough 10-15 mcg/mL.  Ceftriaxone 2gm IV Q24H and metronidazole 500mg  IV q8h  Monitor renal function closely. Daily Scr per protocol while on vancomycin  May be able to d/c antibiotics in 1-2 days per provider now that source of infection has been removed   Height: 5' (152.4 cm) Weight: 52.2 kg (115 lb 1.3 oz) IBW/kg (Calculated) : 45.5  Temp (24hrs), Avg:98.7 F (37.1 C), Min:98 F (36.7 C), Max:99.9 F (37.7 C)  Recent Labs  Lab 11/05/19 0531 11/05/19 0715 11/06/19 0533  WBC 16.3*  --  17.2*  CREATININE 1.52*  --  1.41*  LATICACIDVEN 3.1* 1.7  --     Estimated Creatinine Clearance: 16.4 mL/min (A) (by C-G formula based on SCr of 1.41 mg/dL (H)).    No Known Allergies  Antimicrobials this admission: 6/23 vancomycin >> 6/23 metronidazole >> 6/23 ceftriaxone >>  Dose adjustments this admission:  Microbiology results: 6/23 BCx: NG x 1 day 6/23 Wound Cx: pending 6/23 UCx: pending  Thank you for allowing pharmacy to be a part of this patient's care.  [place] Resident 11/06/2019 9:07 AM

## 2019-11-06 NOTE — Op Note (Signed)
  [facility]   OPERATIVE NOTE   PROCEDURE:  Right above-the-knee amputation  PRE-OPERATIVE DIAGNOSIS: Right foot gangrene  POST-OPERATIVE DIAGNOSIS: same as above  SURGEON:  Leotis Pain, MD  ASSISTANT(S): Hezzie Bump, PA-C  ANESTHESIA: general  ESTIMATED BLOOD LOSS: 100 cc  FINDING(S): none  SPECIMEN(S):  Right above-the-knee amputation  INDICATIONS:   [name] is a 86 y.o. female who presents with right foot gangrene.  The patient is scheduled for a right above-the-knee amputation.  I discussed in depth with the patient the risks, benefits, and alternatives to this procedure.  The patient is aware that the risk of this operation included but are not limited to:  bleeding, infection, myocardial infarction, stroke, death, failure to heal amputation wound, and possible need for more proximal amputation.  The patient is aware of the risks and agrees proceed forward with the procedure. An assistant was present during the procedure to help facilitate the exposure and expedite the procedure.  DESCRIPTION: After full informed written consent was obtained from the patient, the patient was taken to the operating room, and placed supine upon the operating table.  Prior to induction, the patient received IV antibiotics.  The patient was then prepped and draped in the standard fashion for a right above-the-knee amputation.  After obtaining adequate anesthesia, the patient was prepped and draped in the standard fashion for a above-the-knee amputation.  I marked out the anterior and posterior flaps for a fish-mouth type of amputation. The assistant provided retraction and mobilization to help facilitate exposure and expedite the procedure throughout the entire procedure.  This included following suture, using retractors, and optimizing lighting. I made the incisions for these flaps, and then dissected through the subcutaneous tissue, fascia, and muscles circumferentially.   I elevated  the periosteal tissue 4-5 cm more proximal than the anterior skin flap.  I then transected the femur with a power saw at this level.  Then I smoothed out the rough edges of the bone.  At this point, the specimen was passed off the field as the above-the-knee amputation.  At this point, I clamped all visibly bleeding arteries and veins using a combination of suture ligation with silk suture and electrocautery.   Bleeding continued to be controlled with electrocautery and suture ligature.  The stump was washed off with sterile normal saline and no further active bleeding was noted.  I reapproximated the anterior and posterior fascia  with interrupted stitches of 0 Vicryl.  This was completed along the entire length of anterior and posterior fascia until there were no more loose space in the fascial line. The subcutaneous tissue was then approximated with 2-0 vicryl sutures. The skin was then  reapproximated with staples.  The stump was washed off and dried.  The incision was dressed with Xeroform and ABD pads, and  then fluffs were applied.  Kerlix was wrapped around the leg and then gently an ACE wrap was applied.  A large Ioban was then placed over the ACE wrap to secure the dressing. The patient was then awakened and take to the recovery room in stable condition.   COMPLICATIONS: none  CONDITION: stable  Leotis Pain  11/06/2019, 8:39 AM   This note was created with Dragon Medical transcription system. Any errors in dictation are purely unintentional.

## 2019-11-06 NOTE — Anesthesia Preprocedure Evaluation (Signed)
Anesthesia Evaluation  Patient identified by MRN, date of birth, ID band Patient awake    Reviewed: Allergy & Precautions, NPO status , Patient's Chart, lab work & pertinent test results  History of Anesthesia Complications Negative for: history of anesthetic complications  Airway Mallampati: IV       Dental   Pulmonary neg sleep apnea, neg COPD, Not current smoker,           Cardiovascular hypertension, Pt. on medications (-) Past MI and (-) CHF (-) dysrhythmias (-) Valvular Problems/Murmurs     Neuro/Psych neg Seizures Depression Dementia    GI/Hepatic Neg liver ROS, GERD  Medicated,  Endo/Other  neg diabetes  Renal/GU Renal diseasenegative Renal ROS     Musculoskeletal   Abdominal   Peds  Hematology  (+) anemia ,   Anesthesia Other Findings   Reproductive/Obstetrics                             Anesthesia Physical Anesthesia Plan  ASA: III  Anesthesia Plan: General   Post-op Pain Management:    Induction: Intravenous  PONV Risk Score and Plan: 3 and Ondansetron, Dexamethasone and Treatment may vary due to age or medical condition  Airway Management Planned: LMA  Additional Equipment:   Intra-op Plan:   Post-operative Plan:   Informed Consent: I have reviewed the patients History and Physical, chart, labs and discussed the procedure including the risks, benefits and alternatives for the proposed anesthesia with the patient or authorized representative who has indicated his/her understanding and acceptance.       Plan Discussed with:   Anesthesia Plan Comments:         Anesthesia Quick Evaluation

## 2019-11-06 NOTE — Progress Notes (Signed)
PROGRESS NOTE    [name]  IDP:[id number] DOB: 05-27-1920 DOA: 11/05/2019 PCP: Jodi Marble, MD    Assessment & Plan:   Principal Problem:   Gangrene of right foot ([facility]) Active Problems:   HTN (hypertension)   CKD (chronic kidney disease) stage 4, GFR 15-29 ml/min (HCC)   Depression   Cognitive impairment   Atherosclerosis of native arteries of the extremities with ulceration (HCC)   Gangrene due to peripheral vascular disease (HCC)    [name] is a 86 y.o. female with medical history significant for peripheral vascular disease, chronic kidney disease, arthritis, GERD and hypertension who presents to the emergency room for evaluation of severe pain involving the right leg.   Gangrene of right foot with associated cellulitis And severe ischemic pain Patient with known severe peripheral vascular disease status post recent angioplasty Lactic acid on admission was 2.1 and she has a white count of 16,000 with a left shift Place patient on empiric antibiotic therapy with vancomycin and meropenem adjusted to renal function Wound culture from 09/23/19 yielded pansensitive E. coli and Proteus PLAN: --Right AKA today --continue IV abx for now --Pain control --PT    Severe peripheral vascular disease Status post recent stent angioplasty  --hold aspirin and Plavix for possible procedure   Continue statins and beta-blockers   Hypertension Continue amlodipine, hydralazine and metoprolol   Depression Continue Zoloft   Chronic kidney disease stage IV We will monitor renal function closely during this hospitalization and will request nephrology consult if any worsening   DVT prophylaxis: Heparin SQ Code Status: Full code  Family Communication:  Status is: inpatient Dispo:   The patient is from: home Anticipated d/c is to: likely SNF rehab Anticipated d/c date is: 2-3 days Patient currently is not medically stable to d/c due to: just post-op, need PT  eval and surgical clearance.   Subjective and Interval History:  Pt received right AKA today.  Post-op, pt complained of pain at the stump site.  Pt was not able to answer other questions.  No fever, N/V/D.   Objective: Vitals:   11/06/19 0955 11/06/19 1032 11/06/19 1129 11/06/19 1947  BP: (!) 132/48 (!) 125/51 (!) 144/55 (!) 119/49  Pulse: 71 72 66 72  Resp: 16 16 16    Temp: 98.4 F (36.9 C) 98.8 F (37.1 C) 97.6 F (36.4 C) 98.5 F (36.9 C)  TempSrc: Oral Oral Oral Oral  SpO2: 100% 94% 100% 100%  Weight:      Height:        Intake/Output Summary (Last 24 hours) at 11/06/2019 2126 Last data filed at 11/06/2019 1700 Gross per 24 hour  Intake 2313 ml  Output 1170 ml  Net 1143 ml   Filed Weights   11/05/19 0518 11/06/19 0645  Weight: 52.2 kg 52.2 kg    Examination:   Constitutional: In mild distress, moaning, alert, not oriented, confused HEENT: conjunctivae and lids normal, EOMI CV: RRR no M,R,G. Distal pulses +2.  No cyanosis.   RESP: CTA B/L, normal respiratory effort  GI: +BS, NTND Extremities: No effusions, edema in LLE.  Right AKA stump site wrapped. SKIN: warm, dry Neuro: II - XII grossly intact.      Data Reviewed: I have personally reviewed following labs and imaging studies  CBC: Recent Labs  Lab 11/05/19 0531 11/06/19 0533  WBC 16.3* 17.2*  NEUTROABS 12.9*  --   HGB 9.3* 9.3*  HCT 27.8* 27.8*  MCV 78.3* 79.0*  PLT 544* 480*  Basic Metabolic Panel: Recent Labs  Lab 11/05/19 0531 11/06/19 0533  NA 141 137  K 4.7 4.8  CL 104 103  CO2 25 26  GLUCOSE 111* 95  BUN 45* 34*  CREATININE 1.52* 1.41*  CALCIUM 9.2 8.5*  MG  --  1.9   GFR: Estimated Creatinine Clearance: 16.4 mL/min (A) (by C-G formula based on SCr of 1.41 mg/dL (H)). Liver Function Tests: Recent Labs  Lab 11/05/19 0531  AST 27  ALT 24  ALKPHOS 70  BILITOT 0.6  PROT 8.8*  ALBUMIN 2.9*   No results for input(s): LIPASE, AMYLASE in the last 168 hours. No results for  input(s): AMMONIA in the last 168 hours. Coagulation Profile: Recent Labs  Lab 11/05/19 0531 11/06/19 0533  INR 1.2 1.2   Cardiac Enzymes: No results for input(s): CKTOTAL, CKMB, CKMBINDEX, TROPONINI in the last 168 hours. BNP (last 3 results) No results for input(s): PROBNP in the last 8760 hours. HbA1C: No results for input(s): HGBA1C in the last 72 hours. CBG: No results for input(s): GLUCAP in the last 168 hours. Lipid Profile: No results for input(s): CHOL, HDL, LDLCALC, TRIG, CHOLHDL, LDLDIRECT in the last 72 hours. Thyroid Function Tests: No results for input(s): TSH, T4TOTAL, FREET4, T3FREE, THYROIDAB in the last 72 hours. Anemia Panel: No results for input(s): VITAMINB12, FOLATE, FERRITIN, TIBC, IRON, RETICCTPCT in the last 72 hours. Sepsis Labs: Recent Labs  Lab 11/05/19 0531 11/05/19 0715  LATICACIDVEN 3.1* 1.7    Recent Results (from the past 240 hour(s))  Blood Culture (routine x 2)     Status: None (Preliminary result)   Collection Time: 11/05/19  5:31 AM   Specimen: BLOOD LEFT WRIST  Result Value Ref Range Status   Specimen Description BLOOD LEFT WRIST  Final   Special Requests   Final    BOTTLES DRAWN AEROBIC AND ANAEROBIC Blood Culture adequate volume   Culture   Final    NO GROWTH 1 DAY Performed at [facility], [address], [place], [place] [address]    Report Status PENDING  Incomplete  Blood Culture (routine x 2)     Status: None (Preliminary result)   Collection Time: 11/05/19  5:36 AM   Specimen: Left Antecubital; Blood  Result Value Ref Range Status   Specimen Description LEFT ANTECUBITAL  Final   Special Requests   Final    BOTTLES DRAWN AEROBIC AND ANAEROBIC Blood Culture adequate volume   Culture   Final    NO GROWTH 1 DAY Performed at [facility], [address], [place], [place] [address]    Report Status PENDING  Incomplete  SARS Coronavirus 2 by RT PCR (hospital order, performed in [place] hospital lab)  Nasopharyngeal Nasopharyngeal Swab     Status: None   Collection Time: 11/05/19  6:50 AM   Specimen: Nasopharyngeal Swab  Result Value Ref Range Status   SARS Coronavirus 2 NEGATIVE NEGATIVE Final    Comment: (NOTE) SARS-CoV-2 target nucleic acids are NOT DETECTED.  The SARS-CoV-2 RNA is generally detectable in upper and lower respiratory specimens during the acute phase of infection. The lowest concentration of SARS-CoV-2 viral copies this assay can detect is 250 copies / mL. A negative result does not preclude SARS-CoV-2 infection and should not be used as the sole basis for treatment or other patient management decisions.  A negative result may occur with improper specimen collection / handling, submission of specimen other than nasopharyngeal swab, presence of viral mutation(s) within the areas targeted  by this assay, and inadequate number of viral copies (<250 copies / mL). A negative result must be combined with clinical observations, patient history, and epidemiological information.  Fact Sheet for Patients:   StrictlyIdeas.no  Fact Sheet for Healthcare Providers: BankingDealers.co.za  This test is not yet approved or  cleared by the [place] FDA and has been authorized for detection and/or diagnosis of SARS-CoV-2 by FDA under an Emergency Use Authorization (EUA).  This EUA will remain in effect (meaning this test can be used) for the duration of the COVID-19 declaration under Section 564(b)(1) of the Act, 21 U.S.C. section 360bbb-3(b)(1), unless the authorization is terminated or revoked sooner.  Performed at [facility], [address], [place], [place] [address]   Aerobic/Anaerobic Culture (surgical/deep wound)     Status: None (Preliminary result)   Collection Time: 11/05/19 12:36 PM   Specimen: Wound  Result Value Ref Range Status   Specimen Description   Final    WOUND Performed at [facility], [address], [place], [place] [address]    Special Requests   Final    NONE Performed at [facility], [address] [place], [place] [address]    Gram Stain   Final    RARE WBC PRESENT, PREDOMINANTLY PMN ABUNDANT GRAM NEGATIVE RODS ABUNDANT GRAM POSITIVE COCCI IN PAIRS IN CLUSTERS    Culture   Final    TOO [place] TO READ Performed at [facility], [place] [address] [place], [place] [address]    Report Status PENDING  Incomplete      Radiology Studies: DG Foot Complete Right  Result Date: 11/05/2019 CLINICAL DATA:  Right leg pain.  Osteomyelitis. EXAM: RIGHT FOOT COMPLETE - 3+ VIEW COMPARISON:  Right foot radiographs 09/11/2019 FINDINGS: Bone is scratched at the foot is diffusely demineralized. No focal erosive lesions are present. Microvascular calcifications are evident. No acute or healing fracture is present. IMPRESSION: 1. No acute or healing fracture. 2. Diffuse demineralization. 3. Microvascular calcifications suggesting diabetes. 4. No focal osseous erosion. While osteomyelitis is not identified, it is not excluded. Electronically Signed   By: San Morelle M.D.   On: 11/05/2019 07:21     Scheduled Meds: . allopurinol  300 mg Oral Daily  . amLODipine  5 mg Oral Daily  . atorvastatin  10 mg Oral Daily  . Chlorhexidine Gluconate Cloth  6 each Topical Daily  . docusate sodium  200 mg Oral BID  . gabapentin  300 mg Oral TID  . heparin  5,000 Units Subcutaneous Q8H  . hydrALAZINE  50 mg Oral BID  . iron polysaccharides  150 mg Oral q morning - 10a  . metoprolol tartrate  25 mg Oral BID  . pantoprazole  40 mg Oral Daily  . sertraline  25 mg Oral Q breakfast   Continuous Infusions: . sodium chloride Stopped (11/06/19 1657)  . cefTRIAXone (ROCEPHIN)  IV 200 mL/hr at 11/06/19 1700  . metronidazole Stopped (11/06/19 1504)  . [START ON 11/07/2019] vancomycin       LOS: 1 day     Enzo Bi, MD [facility] If 7PM-7AM, please contact  night-coverage 11/06/2019, 9:26 PM

## 2019-11-06 NOTE — H&P (Signed)
[facility] History & Physical Update  The patient was interviewed and re-examined.  The patient's previous History and Physical has been reviewed and is unchanged.  There is no change in the plan of care. We plan to proceed with the scheduled procedure.  Leotis Pain, MD  11/06/2019, 7:22 AM

## 2019-11-06 NOTE — Anesthesia Postprocedure Evaluation (Signed)
Anesthesia Post Note  Patient: [name]  Procedure(s) Performed: AMPUTATION ABOVE KNEE (Right Knee)  Patient location during evaluation: PACU Anesthesia Type: General Level of consciousness: awake and alert Pain management: pain level controlled Vital Signs Assessment: post-procedure vital signs reviewed and stable Respiratory status: spontaneous breathing and respiratory function stable Cardiovascular status: stable Anesthetic complications: no   No complications documented.   Last Vitals:  Vitals:   11/06/19 0845 11/06/19 0900  BP: 136/62 (!) 116/50  Pulse: 68 71  Resp: 14 15  Temp: 36.8 C   SpO2: 98% 95%    Last Pain:  Vitals:   11/06/19 0900  TempSrc:   PainSc: Asleep                 Herberta Pickron K

## 2019-11-06 NOTE — Progress Notes (Signed)
Called Pt's daughter Odette Fraction to get consent for her mother's surgery at 0730 today. Elisha Headland, RN and [employer], RN did a telephone consent and [name] gave permission for surgery with Dr. Lucky Cowboy for a right above the knee amputation. Will continue to monitor.

## 2019-11-06 NOTE — Transfer of Care (Signed)
Immediate Anesthesia Transfer of Care Note  Patient: [name]  Procedure(s) Performed: AMPUTATION ABOVE KNEE (Right Knee)  Patient Location: PACU  Anesthesia Type:General  Level of Consciousness: awake  Airway & Oxygen Therapy: Patient Spontanous Breathing  Post-op Assessment: Report given to RN  Post vital signs: stable  Last Vitals:  Vitals Value Taken Time  BP 136/62 11/06/19 0845  Temp    Pulse 68 11/06/19 0847  Resp 14 11/06/19 0847  SpO2 97 % 11/06/19 0847  Vitals shown include unvalidated device data.  Last Pain:  Vitals:   11/06/19 0645  TempSrc: Tympanic  PainSc: Asleep         Complications: No complications documented.

## 2019-11-06 NOTE — Progress Notes (Signed)
   11/06/19 0720  Clinical Encounter Type  Visited With Patient  Visit Type Initial  Referral From Chaplain  Consult/Referral To Chaplain  While rounding in [place], chaplain briefly visited with patient. Patient was somewhat sleep, but looked up when chaplain spoke to her. Chaplain prayed for patient and left. It was a pleasure being in the present of this senior. In her patient, chaplain thanked God for the wisdom of Ms. Diop.

## 2019-11-06 NOTE — Progress Notes (Signed)
Order received from Dr Lai to discontinue telemetry 

## 2019-11-07 ENCOUNTER — Encounter: Payer: Self-pay | Admitting: Vascular Surgery

## 2019-11-07 LAB — CBC
HCT: 20.7 % — ABNORMAL LOW (ref 36.0–46.0)
Hemoglobin: 7.2 g/dL — ABNORMAL LOW (ref 12.0–15.0)
MCH: 26.7 pg (ref 26.0–34.0)
MCHC: 34.8 g/dL (ref 30.0–36.0)
MCV: 76.7 fL — ABNORMAL LOW (ref 80.0–100.0)
Platelets: 399 10*3/uL (ref 150–400)
RBC: 2.7 MIL/uL — ABNORMAL LOW (ref 3.87–5.11)
RDW: 15 % (ref 11.5–15.5)
WBC: 14.3 10*3/uL — ABNORMAL HIGH (ref 4.0–10.5)
nRBC: 0 % (ref 0.0–0.2)

## 2019-11-07 LAB — BASIC METABOLIC PANEL
Anion gap: 10 (ref 5–15)
BUN: 25 mg/dL — ABNORMAL HIGH (ref 8–23)
CO2: 22 mmol/L (ref 22–32)
Calcium: 7.8 mg/dL — ABNORMAL LOW (ref 8.9–10.3)
Chloride: 105 mmol/L (ref 98–111)
Creatinine, Ser: 1.24 mg/dL — ABNORMAL HIGH (ref 0.44–1.00)
GFR calc Af Amer: 42 mL/min — ABNORMAL LOW (ref >60)
GFR calc non Af Amer: 36 mL/min — ABNORMAL LOW (ref >60)
Glucose, Bld: 77 mg/dL (ref 70–99)
Potassium: 4 mmol/L (ref 3.5–5.1)
Sodium: 137 mmol/L (ref 135–145)

## 2019-11-07 LAB — URINE CULTURE: Culture: NO GROWTH

## 2019-11-07 LAB — PREPARE RBC (CROSSMATCH)

## 2019-11-07 LAB — MAGNESIUM: Magnesium: 1.6 mg/dL — ABNORMAL LOW (ref 1.7–2.4)

## 2019-11-07 MED ORDER — MAGNESIUM SULFATE 2 GM/50ML IV SOLN
2.0000 g | Freq: Once | INTRAVENOUS | Status: AC
  Administered 2019-11-07: 2 g via INTRAVENOUS
  Filled 2019-11-07: qty 50

## 2019-11-07 NOTE — Progress Notes (Signed)
Pharmacy Antibiotic Note  [name] is a 86 y.o. female admitted on 11/05/2019 with ischemic/gangrenous Right foot .  Pharmacy has been consulted for vancomycin dosing. Patient also receiving ceftriaxone and metronidazole.  Podiatry performed debridement of necrotizing tissue on 5/11 in office,  Cultures at that time reveal Pan-susceptible E. Coli and P. Mirabilis.    Today, 11/07/2019  CKD - Scr 1.52 >> 1.41 >> 1.24  Afebrile last 24h  WBC trending down, 17.2 >> 14.3  POD #1 right AKA by vascular  Plan:  Vancomycin 500 IV every q48 hours.  Goal trough 10-15 mcg/mL.  Ceftriaxone 2gm IV Q24H and metronidazole 500mg  IV q8h  Monitor renal function closely. Daily Scr per protocol while on vancomycin  May be able to d/c antibiotics tomorrow per provider if no sign of systemic infection now that source has been removed   Height: 5' (152.4 cm) Weight: 52.2 kg (115 lb 1.3 oz) IBW/kg (Calculated) : 45.5  Temp (24hrs), Avg:98.3 F (36.8 C), Min:97.6 F (36.4 C), Max:98.8 F (37.1 C)  Recent Labs  Lab 11/05/19 0531 11/05/19 0715 11/06/19 0533 11/07/19 0433  WBC 16.3*  --  17.2* 14.3*  CREATININE 1.52*  --  1.41* 1.24*  LATICACIDVEN 3.1* 1.7  --   --     Estimated Creatinine Clearance: 18.6 mL/min (A) (by C-G formula based on SCr of 1.24 mg/dL (H)).    No Known Allergies  Antimicrobials this admission: 6/23 vancomycin >> 6/23 metronidazole >> 6/23 ceftriaxone >>  Dose adjustments this admission:  Microbiology results: 6/23 BCx: NG x 2 day 6/23 Wound Cx: GNR, GPC in pairs in clusters 6/23 UCx: no growth  Thank you for allowing pharmacy to be a part of this patient's care.  [place] Resident 11/07/2019 8:40 AM

## 2019-11-07 NOTE — Care Management Important Message (Signed)
Important Message  Patient Details  Name: [name] MRN: [id number] Date of Birth: 08-27-1920   Medicare Important Message Given:  Yes  Initial Medicare IM given by Patient Access Associate on 11/06/2019 at 10:38am.     Dannette Barbara 11/07/2019, 8:09 AM

## 2019-11-07 NOTE — Progress Notes (Signed)
Foley catheter removed today due to void by 10pm.

## 2019-11-07 NOTE — Progress Notes (Signed)
PROGRESS NOTE    [name]  IRS:[id number] DOB: 28-Aug-1920 DOA: 11/05/2019 PCP: Jodi Marble, MD    Assessment & Plan:   Principal Problem:   Gangrene of right foot ([place]) Active Problems:   HTN (hypertension)   CKD (chronic kidney disease) stage 4, GFR 15-29 ml/min (HCC)   Depression   Cognitive impairment   Atherosclerosis of native arteries of the extremities with ulceration (HCC)   Gangrene due to peripheral vascular disease (HCC)    [name] is a 86 y.o. female with medical history significant for peripheral vascular disease, chronic kidney disease, arthritis, GERD and hypertension who presents to the emergency room for evaluation of severe pain involving the right leg.   Gangrene of right foot with associated cellulitis And severe ischemic pain Patient with known severe peripheral vascular disease status post recent angioplasty S/p right AKA on 6/24 Lactic acid on admission was 2.1 and she has a white count of 16,000 with a left shift Place patient on empiric antibiotic therapy with vancomycin and meropenem adjusted to renal function Wound culture from 09/23/19 yielded pansensitive E. coli and Proteus PLAN: --continue IV abx for now --Pain control --PT    Severe peripheral vascular disease Status post recent stent angioplasty  --resume home aspirin and Plavix tomorrow  --Continue statins and beta-blockers  Acute on chronic anemia  2/2 blood loss from surgery --Monitor Hgb and transfuse to keep Hgb >7   Hypertension Continue amlodipine, hydralazine and metoprolol   Depression Continue Zoloft   Chronic kidney disease stage IV We will monitor renal function closely during this hospitalization and will request nephrology consult if any worsening   DVT prophylaxis: Heparin SQ Code Status: Full code  Family Communication:  Status is: inpatient Dispo:   The patient is from: home Anticipated d/c is to: likely SNF rehab Anticipated d/c  date is: 2-3 days Patient currently is not medically stable to d/c due to:  just post-op, need PT eval and surgical clearance.   Subjective and Interval History:  Pt couldn't answer ROS questions, but appeared to be in pain.  Ate her meals with assistance.  No fever, N/V/D.   Objective: Vitals:   11/06/19 1947 11/07/19 0432 11/07/19 0920 11/07/19 1159  BP: (!) 119/49 (!) 120/55 (!) 123/57 (!) 117/50  Pulse: 72 79 81 70  Resp:  18 12 15   Temp: 98.5 F (36.9 C) 98.3 F (36.8 C) 98.5 F (36.9 C) 98.8 F (37.1 C)  TempSrc: Oral Oral Oral Oral  SpO2: 100% 100% 100% 99%  Weight:      Height:        Intake/Output Summary (Last 24 hours) at 11/07/2019 1829 Last data filed at 11/07/2019 1617 Gross per 24 hour  Intake 1032.7 ml  Output 1150 ml  Net -117.3 ml   Filed Weights   11/05/19 0518 11/06/19 0645  Weight: 52.2 kg 52.2 kg    Examination:   Constitutional: NAD, alert, not oriented, confused HEENT: conjunctivae and lids normal, EOMI CV: RRR no M,R,G. Distal pulses +2.  No cyanosis.   RESP: CTA B/L, normal respiratory effort  GI: +BS, NTND Extremities: No effusions, edema in LLE.  Right AKA stump site wrapped. SKIN: warm, dry Neuro: II - XII grossly intact.      Data Reviewed: I have personally reviewed following labs and imaging studies  CBC: Recent Labs  Lab 11/05/19 0531 11/06/19 0533 11/07/19 0433  WBC 16.3* 17.2* 14.3*  NEUTROABS 12.9*  --   --  HGB 9.3* 9.3* 7.2*  HCT 27.8* 27.8* 20.7*  MCV 78.3* 79.0* 76.7*  PLT 544* 480* 001   Basic Metabolic Panel: Recent Labs  Lab 11/05/19 0531 11/06/19 0533 11/07/19 0433  NA 141 137 137  K 4.7 4.8 4.0  CL 104 103 105  CO2 25 26 22   GLUCOSE 111* 95 77  BUN 45* 34* 25*  CREATININE 1.52* 1.41* 1.24*  CALCIUM 9.2 8.5* 7.8*  MG  --  1.9 1.6*   GFR: Estimated Creatinine Clearance: 18.6 mL/min (A) (by C-G formula based on SCr of 1.24 mg/dL (H)). Liver Function Tests: Recent Labs  Lab 11/05/19 0531    AST 27  ALT 24  ALKPHOS 70  BILITOT 0.6  PROT 8.8*  ALBUMIN 2.9*   No results for input(s): LIPASE, AMYLASE in the last 168 hours. No results for input(s): AMMONIA in the last 168 hours. Coagulation Profile: Recent Labs  Lab 11/05/19 0531 11/06/19 0533  INR 1.2 1.2   Cardiac Enzymes: No results for input(s): CKTOTAL, CKMB, CKMBINDEX, TROPONINI in the last 168 hours. BNP (last 3 results) No results for input(s): PROBNP in the last 8760 hours. HbA1C: No results for input(s): HGBA1C in the last 72 hours. CBG: No results for input(s): GLUCAP in the last 168 hours. Lipid Profile: No results for input(s): CHOL, HDL, LDLCALC, TRIG, CHOLHDL, LDLDIRECT in the last 72 hours. Thyroid Function Tests: No results for input(s): TSH, T4TOTAL, FREET4, T3FREE, THYROIDAB in the last 72 hours. Anemia Panel: No results for input(s): VITAMINB12, FOLATE, FERRITIN, TIBC, IRON, RETICCTPCT in the last 72 hours. Sepsis Labs: Recent Labs  Lab 11/05/19 0531 11/05/19 0715  LATICACIDVEN 3.1* 1.7    Recent Results (from the past 240 hour(s))  Blood Culture (routine x 2)     Status: None (Preliminary result)   Collection Time: 11/05/19  5:31 AM   Specimen: BLOOD LEFT WRIST  Result Value Ref Range Status   Specimen Description BLOOD LEFT WRIST  Final   Special Requests   Final    BOTTLES DRAWN AEROBIC AND ANAEROBIC Blood Culture adequate volume   Culture   Final    NO GROWTH 2 DAYS Performed at [facility], [address] [place], [place] [address]    Report Status PENDING  Incomplete  Blood Culture (routine x 2)     Status: None (Preliminary result)   Collection Time: 11/05/19  5:36 AM   Specimen: Left Antecubital; Blood  Result Value Ref Range Status   Specimen Description LEFT ANTECUBITAL  Final   Special Requests   Final    BOTTLES DRAWN AEROBIC AND ANAEROBIC Blood Culture adequate volume   Culture   Final    NO GROWTH 2 DAYS Performed at [facility], [address], [place], [place] [address]    Report Status PENDING  Incomplete  SARS Coronavirus 2 by RT PCR (hospital order, performed in [place] hospital lab) Nasopharyngeal Nasopharyngeal Swab     Status: None   Collection Time: 11/05/19  6:50 AM   Specimen: Nasopharyngeal Swab  Result Value Ref Range Status   SARS Coronavirus 2 NEGATIVE NEGATIVE Final    Comment: (NOTE) SARS-CoV-2 target nucleic acids are NOT DETECTED.  The SARS-CoV-2 RNA is generally detectable in upper and lower respiratory specimens during the acute phase of infection. The lowest concentration of SARS-CoV-2 viral copies this assay can detect is 250 copies / mL. A negative result does not preclude SARS-CoV-2 infection and should not be used as the sole basis for  treatment or other patient management decisions.  A negative result may occur with improper specimen collection / handling, submission of specimen other than nasopharyngeal swab, presence of viral mutation(s) within the areas targeted by this assay, and inadequate number of viral copies (<250 copies / mL). A negative result must be combined with clinical observations, patient history, and epidemiological information.  Fact Sheet for Patients:   StrictlyIdeas.no  Fact Sheet for Healthcare Providers: BankingDealers.co.za  This test is not yet approved or  cleared by the [place] FDA and has been authorized for detection and/or diagnosis of SARS-CoV-2 by FDA under an Emergency Use Authorization (EUA).  This EUA will remain in effect (meaning this test can be used) for the duration of the COVID-19 declaration under Section 564(b)(1) of the Act, 21 U.S.C. section 360bbb-3(b)(1), unless the authorization is terminated or revoked sooner.  Performed at [facility], [address] [place], [place] [address]   Aerobic/Anaerobic Culture (surgical/deep wound)     Status: None (Preliminary  result)   Collection Time: 11/05/19 12:36 PM   Specimen: Wound  Result Value Ref Range Status   Specimen Description   Final    WOUND Performed at [facility], [address] [place], [place] [address]    Special Requests   Final    NONE Performed at [facility], [address] [place], [place] [address]    Gram Stain   Final    RARE WBC PRESENT, PREDOMINANTLY PMN ABUNDANT GRAM NEGATIVE RODS ABUNDANT GRAM POSITIVE COCCI IN PAIRS IN CLUSTERS    Culture   Final    MODERATE PROTEUS MIRABILIS HOLDING FOR POSSIBLE ANAEROBE Performed at [facility], [place] [address], [place], [place] [address]    Report Status PENDING  Incomplete  Urine culture     Status: None   Collection Time: 11/05/19  2:57 PM   Specimen: In/Out Cath Urine  Result Value Ref Range Status   Specimen Description   Final    IN/OUT CATH URINE Performed at [facility] [address] [place], [place] [address]    Special Requests   Final    NONE Performed at [facility], [address], [place], [place] [address]    Culture   Final    NO GROWTH Performed at [facility], [place] [address] [place], [place] [address]    Report Status 11/07/2019 FINAL  Final      Radiology Studies: No results found.   Scheduled Meds: . allopurinol  300 mg Oral Daily  . amLODipine  5 mg Oral Daily  . atorvastatin  10 mg Oral Daily  . Chlorhexidine Gluconate Cloth  6 each Topical Daily  . docusate sodium  200 mg Oral BID  . gabapentin  300 mg Oral TID  . heparin  5,000 Units Subcutaneous Q8H  . hydrALAZINE  50 mg Oral BID  . iron polysaccharides  150 mg Oral q morning - 10a  . metoprolol tartrate  25 mg Oral BID  . pantoprazole  40 mg Oral Daily  . sertraline  25 mg Oral Q breakfast   Continuous Infusions: . sodium chloride 75 mL/hr at 11/07/19 1219  . cefTRIAXone (ROCEPHIN)  IV 2 g (11/07/19 1748)  . metronidazole 500 mg (11/07/19 1229)  . vancomycin 500 mg  (11/07/19 0602)     LOS: 2 days     Enzo Bi, MD [facility] If 7PM-7AM, please contact night-coverage 11/07/2019, 6:29 PM

## 2019-11-07 NOTE — Progress Notes (Signed)
[facility] Daily Progress Note   Subjective: 11/06/19: Right above-the-knee amputation  Sleeping comfortably.   Objective: Vitals:   11/06/19 1947 11/07/19 0432 11/07/19 0920 11/07/19 1159  BP: (!) 119/49 (!) 120/55 (!) 123/57 (!) 117/50  Pulse: 72 79 81 70  Resp:  18 12 15   Temp: 98.5 F (36.9 C) 98.3 F (36.8 C) 98.5 F (36.9 C) 98.8 F (37.1 C)  TempSrc: Oral Oral Oral Oral  SpO2: 100% 100% 100% 99%  Weight:      Height:        Intake/Output Summary (Last 24 hours) at 11/07/2019 1618 Last data filed at 11/07/2019 1617 Gross per 24 hour  Intake 1945.7 ml  Output 1400 ml  Net 545.7 ml   Physical Exam: A&Ox1, NAD CV: RRR Pulmonary: CTA Bilaterally Abdomen: Soft, Nontender, Nondistended Vascular:  Right Lower Extremity: Thigh soft. OR dressing clean, dry and intact.    Laboratory: CBC    Component Value Date/Time   WBC 14.3 (H) 11/07/2019 0433   HGB 7.2 (L) 11/07/2019 0433   HGB 9.3 (L) 06/13/2014 1943   HCT 20.7 (L) 11/07/2019 0433   HCT 29.0 (L) 06/13/2014 1943   PLT 399 11/07/2019 0433   PLT 316 06/13/2014 1943   BMET    Component Value Date/Time   NA 137 11/07/2019 0433   NA 146 (H) 08/03/2014 1006   NA 140 06/13/2014 1943   K 4.0 11/07/2019 0433   K 4.0 06/13/2014 1943   CL 105 11/07/2019 0433   CL 101 06/13/2014 1943   CO2 22 11/07/2019 0433   CO2 33 (H) 06/13/2014 1943   GLUCOSE 77 11/07/2019 0433   GLUCOSE 99 06/13/2014 1943   BUN 25 (H) 11/07/2019 0433   BUN 8 (L) 08/03/2014 1006   BUN 14 06/13/2014 1943   CREATININE 1.24 (H) 11/07/2019 0433   CREATININE 1.10 06/13/2014 1943   CALCIUM 7.8 (L) 11/07/2019 0433   CALCIUM 9.0 06/13/2014 1943   GFRNONAA 36 (L) 11/07/2019 0433   GFRNONAA 49 (L) 06/13/2014 1943   GFRAA 42 (L) 11/07/2019 0433   GFRAA 60 (L) 06/13/2014 1943   Assessment/Planning: 86 year old female s/p RLE AKA  POD#1  1) H&H - two gram drop. AM CBC - consider tranfusion of H&H continues to drop.  2)  Leukocytosis improving 3) Dressing change Sunday 4) PT / OT / Ambulation  Discussed with Dr. Ellis Parents Lessly Stigler PA-C 11/07/2019 4:18 PM

## 2019-11-07 NOTE — Progress Notes (Signed)
PT Cancellation Note  Patient Details Name: [name] MRN: [id number] DOB: January 25, 1921   Cancelled Treatment:    Reason Eval/Treat Not Completed: Fatigue/lethargy limiting ability to participate (Consult received and chart reviewed.  Patient sleeping soundly upon arrival; stirs briefly to sternal rub, but does not open eyes or maintain alertness for participation with session.  Will continue efforts next date as medically appropriate and available.)  Kristain Filo H. Owens Shark, PT, DPT, NCS 11/07/19, 11:52 AM [phone]

## 2019-11-08 LAB — PREPARE RBC (CROSSMATCH)

## 2019-11-08 LAB — CBC
HCT: 20.9 % — ABNORMAL LOW (ref 36.0–46.0)
Hemoglobin: 6.9 g/dL — ABNORMAL LOW (ref 12.0–15.0)
MCH: 26 pg (ref 26.0–34.0)
MCHC: 33 g/dL (ref 30.0–36.0)
MCV: 78.9 fL — ABNORMAL LOW (ref 80.0–100.0)
Platelets: 408 10*3/uL — ABNORMAL HIGH (ref 150–400)
RBC: 2.65 MIL/uL — ABNORMAL LOW (ref 3.87–5.11)
RDW: 14.9 % (ref 11.5–15.5)
WBC: 15 10*3/uL — ABNORMAL HIGH (ref 4.0–10.5)
nRBC: 0 % (ref 0.0–0.2)

## 2019-11-08 LAB — BASIC METABOLIC PANEL
Anion gap: 6 (ref 5–15)
BUN: 21 mg/dL (ref 8–23)
CO2: 24 mmol/L (ref 22–32)
Calcium: 7.8 mg/dL — ABNORMAL LOW (ref 8.9–10.3)
Chloride: 108 mmol/L (ref 98–111)
Creatinine, Ser: 1.19 mg/dL — ABNORMAL HIGH (ref 0.44–1.00)
GFR calc Af Amer: 44 mL/min — ABNORMAL LOW (ref >60)
GFR calc non Af Amer: 38 mL/min — ABNORMAL LOW (ref >60)
Glucose, Bld: 107 mg/dL — ABNORMAL HIGH (ref 70–99)
Potassium: 3.7 mmol/L (ref 3.5–5.1)
Sodium: 138 mmol/L (ref 135–145)

## 2019-11-08 LAB — MAGNESIUM: Magnesium: 2.2 mg/dL (ref 1.7–2.4)

## 2019-11-08 MED ORDER — PANTOPRAZOLE SODIUM 40 MG PO PACK: 40.0000 mg | PACK | Freq: Every day | ORAL | Status: DC

## 2019-11-08 MED ORDER — ASPIRIN 81 MG PO CHEW
81.0000 mg | CHEWABLE_TABLET | Freq: Every day | ORAL | Status: DC
  Administered 2019-11-09 – 2019-11-10 (×2): 81 mg via ORAL
  Filled 2019-11-08 (×2): qty 1

## 2019-11-08 MED ORDER — DOCUSATE SODIUM 50 MG/5ML PO LIQD
200.0000 mg | Freq: Two times a day (BID) | ORAL | Status: DC
  Administered 2019-11-08 – 2019-11-10 (×4): 200 mg via ORAL
  Filled 2019-11-08 (×6): qty 20

## 2019-11-08 MED ORDER — CLOPIDOGREL BISULFATE 75 MG PO TABS
75.0000 mg | ORAL_TABLET | Freq: Every day | ORAL | Status: DC
  Administered 2019-11-08: 75 mg via ORAL
  Filled 2019-11-08: qty 1

## 2019-11-08 MED ORDER — SODIUM CHLORIDE 0.9% IV SOLUTION: Freq: Once | INTRAVENOUS | Status: AC

## 2019-11-08 MED ORDER — ASPIRIN EC 81 MG PO TBEC
81.0000 mg | DELAYED_RELEASE_TABLET | Freq: Every day | ORAL | Status: DC
  Administered 2019-11-08: 81 mg via ORAL
  Filled 2019-11-08: qty 1

## 2019-11-08 MED ORDER — GABAPENTIN 250 MG/5ML PO SOLN
300.0000 mg | Freq: Three times a day (TID) | ORAL | Status: DC
  Administered 2019-11-08 – 2019-11-10 (×6): 300 mg via ORAL
  Filled 2019-11-08 (×10): qty 6

## 2019-11-08 MED ORDER — PANTOPRAZOLE SODIUM 40 MG PO PACK
40.0000 mg | PACK | Freq: Every day | ORAL | Status: DC
  Administered 2019-11-09 – 2019-11-10 (×2): 40 mg via ORAL
  Filled 2019-11-08 (×3): qty 20

## 2019-11-08 NOTE — Progress Notes (Signed)
PROGRESS NOTE    [name]  QTM:[id number] DOB: 07/03/1920 DOA: 11/05/2019 PCP: Jodi Marble, MD    Assessment & Plan:   Principal Problem:   Gangrene of right foot ([place]) Active Problems:   HTN (hypertension)   CKD (chronic kidney disease) stage 4, GFR 15-29 ml/min (HCC)   Depression   Cognitive impairment   Atherosclerosis of native arteries of the extremities with ulceration (HCC)   Gangrene due to peripheral vascular disease (HCC)    [name] is a 86 y.o. AA female with medical history significant for peripheral vascular disease, chronic kidney disease, arthritis, GERD and hypertension who presents to the emergency room for evaluation of severe pain involving the right leg.   Gangrene of right foot with associated cellulitis And severe ischemic pain Patient with known severe peripheral vascular disease status post recent angioplasty S/p right AKA on 6/24 Lactic acid on admission was 2.1 and she has a white count of 16,000 with a left shift Place patient on empiric antibiotic therapy with vancomycin and meropenem adjusted to renal function Wound culture from 09/23/19 yielded pansensitive E. coli and Proteus PLAN: --continue IV abx for now --Pain control --PT    Severe peripheral vascular disease Status post recent stent angioplasty  --resume home aspirin and Plavix tomorrow  --Continue statins and beta-blockers  Acute on chronic anemia  2/2 blood loss from surgery --Monitor Hgb and transfuse to keep Hgb >7 --1u pRBC today for Hgb 6.9.  Hypertension Continue amlodipine, hydralazine and metoprolol   Depression Continue Zoloft   Chronic kidney disease stage IV We will monitor renal function closely during this hospitalization and will request nephrology consult if any worsening   DVT prophylaxis: Heparin SQ Code Status: Full code  Family Communication:  Status is: inpatient Dispo:   The patient is from: home Anticipated d/c is to:  likely SNF rehab Anticipated d/c date is: 2-3 days Patient currently is not medically stable to d/c due to:  need PT eval and surgical clearance.  Also need to ensure stable Hgb.   Subjective and Interval History:  Pt kept her eyes squeezed shut and refused to follow commands today.  Couldn't answer ROS questions.  Received 1u pRBC for Hgb 6.9.   Objective: Vitals:   11/08/19 0842 11/08/19 1216 11/08/19 1504 11/08/19 1530  BP: 128/65 (!) 116/52 (!) 112/50 (!) 116/52  Pulse: 80 68 69 71  Resp:  14 19 18   Temp:  98 F (36.7 C) 98.4 F (36.9 C) 98.1 F (36.7 C)  TempSrc:  Oral Oral Axillary  SpO2:  100% 99% 99%  Weight:      Height:        Intake/Output Summary (Last 24 hours) at 11/08/2019 1536 Last data filed at 11/08/2019 0056 Gross per 24 hour  Intake 1433.46 ml  Output 775 ml  Net 658.46 ml   Filed Weights   11/05/19 0518 11/06/19 0645  Weight: 52.2 kg 52.2 kg    Examination:   Constitutional: NAD, sleeping, refused to open eyes CV: RRR no M,R,G. Distal pulses +2.  No cyanosis.   RESP: CTA B/L, normal respiratory effort  GI: +BS, NTND Extremities: No effusions, edema in LLE.  Right AKA stump site wrapped. SKIN: warm, dry Neuro: II - XII grossly intact.      Data Reviewed: I have personally reviewed following labs and imaging studies  CBC: Recent Labs  Lab 11/05/19 0531 11/06/19 0533 11/07/19 0433 11/08/19 0731  WBC 16.3* 17.2* 14.3* 15.0*  NEUTROABS  12.9*  --   --   --   HGB 9.3* 9.3* 7.2* 6.9*  HCT 27.8* 27.8* 20.7* 20.9*  MCV 78.3* 79.0* 76.7* 78.9*  PLT 544* 480* 399 622*   Basic Metabolic Panel: Recent Labs  Lab 11/05/19 0531 11/06/19 0533 11/07/19 0433 11/08/19 0731  NA 141 137 137 138  K 4.7 4.8 4.0 3.7  CL 104 103 105 108  CO2 25 26 22 24   GLUCOSE 111* 95 77 107*  BUN 45* 34* 25* 21  CREATININE 1.52* 1.41* 1.24* 1.19*  CALCIUM 9.2 8.5* 7.8* 7.8*  MG  --  1.9 1.6* 2.2   GFR: Estimated Creatinine Clearance: 19.4 mL/min (A) (by  C-G formula based on SCr of 1.19 mg/dL (H)). Liver Function Tests: Recent Labs  Lab 11/05/19 0531  AST 27  ALT 24  ALKPHOS 70  BILITOT 0.6  PROT 8.8*  ALBUMIN 2.9*   No results for input(s): LIPASE, AMYLASE in the last 168 hours. No results for input(s): AMMONIA in the last 168 hours. Coagulation Profile: Recent Labs  Lab 11/05/19 0531 11/06/19 0533  INR 1.2 1.2   Cardiac Enzymes: No results for input(s): CKTOTAL, CKMB, CKMBINDEX, TROPONINI in the last 168 hours. BNP (last 3 results) No results for input(s): PROBNP in the last 8760 hours. HbA1C: No results for input(s): HGBA1C in the last 72 hours. CBG: No results for input(s): GLUCAP in the last 168 hours. Lipid Profile: No results for input(s): CHOL, HDL, LDLCALC, TRIG, CHOLHDL, LDLDIRECT in the last 72 hours. Thyroid Function Tests: No results for input(s): TSH, T4TOTAL, FREET4, T3FREE, THYROIDAB in the last 72 hours. Anemia Panel: No results for input(s): VITAMINB12, FOLATE, FERRITIN, TIBC, IRON, RETICCTPCT in the last 72 hours. Sepsis Labs: Recent Labs  Lab 11/05/19 0531 11/05/19 0715  LATICACIDVEN 3.1* 1.7    Recent Results (from the past 240 hour(s))  Blood Culture (routine x 2)     Status: None (Preliminary result)   Collection Time: 11/05/19  5:31 AM   Specimen: BLOOD LEFT WRIST  Result Value Ref Range Status   Specimen Description BLOOD LEFT WRIST  Final   Special Requests   Final    BOTTLES DRAWN AEROBIC AND ANAEROBIC Blood Culture adequate volume   Culture   Final    NO GROWTH 3 DAYS Performed at [facility], [address], [place], [place] [address]    Report Status PENDING  Incomplete  Blood Culture (routine x 2)     Status: None (Preliminary result)   Collection Time: 11/05/19  5:36 AM   Specimen: Left Antecubital; Blood  Result Value Ref Range Status   Specimen Description LEFT ANTECUBITAL  Final   Special Requests   Final    BOTTLES DRAWN AEROBIC AND ANAEROBIC Blood Culture  adequate volume   Culture   Final    NO GROWTH 3 DAYS Performed at [facility], [address] [place], [place] [address]    Report Status PENDING  Incomplete  SARS Coronavirus 2 by RT PCR (hospital order, performed in [place] hospital lab) Nasopharyngeal Nasopharyngeal Swab     Status: None   Collection Time: 11/05/19  6:50 AM   Specimen: Nasopharyngeal Swab  Result Value Ref Range Status   SARS Coronavirus 2 NEGATIVE NEGATIVE Final    Comment: (NOTE) SARS-CoV-2 target nucleic acids are NOT DETECTED.  The SARS-CoV-2 RNA is generally detectable in upper and lower respiratory specimens during the acute phase of infection. The lowest concentration of SARS-CoV-2 viral copies this assay  can detect is 250 copies / mL. A negative result does not preclude SARS-CoV-2 infection and should not be used as the sole basis for treatment or other patient management decisions.  A negative result may occur with improper specimen collection / handling, submission of specimen other than nasopharyngeal swab, presence of viral mutation(s) within the areas targeted by this assay, and inadequate number of viral copies (<250 copies / mL). A negative result must be combined with clinical observations, patient history, and epidemiological information.  Fact Sheet for Patients:   StrictlyIdeas.no  Fact Sheet for Healthcare Providers: BankingDealers.co.za  This test is not yet approved or  cleared by the [place] FDA and has been authorized for detection and/or diagnosis of SARS-CoV-2 by FDA under an Emergency Use Authorization (EUA).  This EUA will remain in effect (meaning this test can be used) for the duration of the COVID-19 declaration under Section 564(b)(1) of the Act, 21 U.S.C. section 360bbb-3(b)(1), unless the authorization is terminated or revoked sooner.  Performed at [facility], [address],  [place], [place] [address]   Aerobic/Anaerobic Culture (surgical/deep wound)     Status: None (Preliminary result)   Collection Time: 11/05/19 12:36 PM   Specimen: Wound  Result Value Ref Range Status   Specimen Description   Final    WOUND Performed at [facility], [address] [place], [place] [address]    Special Requests   Final    NONE Performed at [facility], [address] [place], [place] [address]    Gram Stain   Final    RARE WBC PRESENT, PREDOMINANTLY PMN ABUNDANT GRAM NEGATIVE RODS ABUNDANT GRAM POSITIVE COCCI IN PAIRS IN CLUSTERS    Culture   Final    MODERATE PROTEUS MIRABILIS CULTURE REINCUBATED FOR BETTER GROWTH FEW BACTEROIDES VULGATUS BETA LACTAMASE POSITIVE Performed at [facility], [place] [address] [place], [place] [address]    Report Status PENDING  Incomplete   Organism ID, Bacteria PROTEUS MIRABILIS  Final      Susceptibility   Proteus mirabilis - MIC*    AMPICILLIN <=2 SENSITIVE Sensitive     CEFAZOLIN <=4 SENSITIVE Sensitive     CEFEPIME <=0.12 SENSITIVE Sensitive     CEFTAZIDIME <=1 SENSITIVE Sensitive     CEFTRIAXONE <=0.25 SENSITIVE Sensitive     CIPROFLOXACIN <=0.25 SENSITIVE Sensitive     GENTAMICIN <=1 SENSITIVE Sensitive     IMIPENEM 2 SENSITIVE Sensitive     TRIMETH/SULFA <=20 SENSITIVE Sensitive     AMPICILLIN/SULBACTAM <=2 SENSITIVE Sensitive     PIP/TAZO <=4 SENSITIVE Sensitive     * MODERATE PROTEUS MIRABILIS  Urine culture     Status: None   Collection Time: 11/05/19  2:57 PM   Specimen: In/Out Cath Urine  Result Value Ref Range Status   Specimen Description   Final    IN/OUT CATH URINE Performed at [facility], [address] [place], [place] [address]    Special Requests   Final    NONE Performed at [facility], [address], [place], [place] [address]    Culture   Final    NO GROWTH Performed at [facility], [place] [address] [place], [place] [address]    Report  Status 11/07/2019 FINAL  Final      Radiology Studies: No results found.   Scheduled Meds: . sodium chloride   Intravenous Once  . allopurinol  300 mg Oral Daily  . amLODipine  5 mg  Oral Daily  . [START ON 11/09/2019] aspirin  81 mg Oral Daily  . atorvastatin  10 mg Oral Daily  . docusate  200 mg Oral BID  . gabapentin  300 mg Oral TID  . heparin  5,000 Units Subcutaneous Q8H  . hydrALAZINE  50 mg Oral BID  . iron polysaccharides  150 mg Oral q morning - 10a  . metoprolol tartrate  25 mg Oral BID  . [START ON 11/09/2019] pantoprazole sodium  40 mg Oral Daily  . sertraline  25 mg Oral Q breakfast   Continuous Infusions: . cefTRIAXone (ROCEPHIN)  IV Stopped (11/07/19 1818)  . metronidazole 500 mg (11/08/19 1359)  . vancomycin Stopped (11/07/19 0704)     LOS: 3 days     Enzo Bi, MD [facility] If 7PM-7AM, please contact night-coverage 11/08/2019, 3:36 PM

## 2019-11-08 NOTE — Progress Notes (Signed)
PT Cancellation Note  Patient Details Name: [name] MRN: [id number] DOB: 1920-09-14   Cancelled Treatment:    Reason Eval/Treat Not Completed: Patient not medically ready.Consult received, chart reviewed. Pt s/p R AKA on 6/24. Bed rest orders still active. Hgb noted to drop, down to 6.9 this morning. Will hold PT evaluation at this time and re-attempt at later date/time as pt is more medically appropriate and once bed rest orders have been removed.   [address] [place] , [place] DPT 11/08/2019, 12:14 PM

## 2019-11-08 NOTE — Progress Notes (Signed)
OT Cancellation Note  Patient Details Name: [name] MRN: [id number] DOB: 1920/12/11   Cancelled Treatment:    Reason Eval/Treat Not Completed: Medical issues which prohibited therapy. Consult received, chart reviewed. Pt s/p R AKA on 6/24. Bed rest orders still active. Hgb noted to drop, down to 6.9 this morning. Will hold OT evaluation at this time and re-attempt at later date/time as pt is more medically appropriate and once bed rest orders have been removed.   Jeni Salles, MPH, MS, OTR/L ascom [phone] 11/08/19, 9:04 AM

## 2019-11-09 LAB — BASIC METABOLIC PANEL
Anion gap: 8 (ref 5–15)
BUN: 16 mg/dL (ref 8–23)
CO2: 23 mmol/L (ref 22–32)
Calcium: 7.9 mg/dL — ABNORMAL LOW (ref 8.9–10.3)
Chloride: 108 mmol/L (ref 98–111)
Creatinine, Ser: 1.02 mg/dL — ABNORMAL HIGH (ref 0.44–1.00)
GFR calc Af Amer: 53 mL/min — ABNORMAL LOW (ref >60)
GFR calc non Af Amer: 46 mL/min — ABNORMAL LOW (ref >60)
Glucose, Bld: 139 mg/dL — ABNORMAL HIGH (ref 70–99)
Potassium: 3.2 mmol/L — ABNORMAL LOW (ref 3.5–5.1)
Sodium: 139 mmol/L (ref 135–145)

## 2019-11-09 LAB — TYPE AND SCREEN
ABO/RH(D): B POS
Antibody Screen: NEGATIVE
Unit division: 0
Unit division: 0
Unit division: 0

## 2019-11-09 LAB — CBC
HCT: 27 % — ABNORMAL LOW (ref 36.0–46.0)
Hemoglobin: 9.2 g/dL — ABNORMAL LOW (ref 12.0–15.0)
MCH: 27.5 pg (ref 26.0–34.0)
MCHC: 34.1 g/dL (ref 30.0–36.0)
MCV: 80.6 fL (ref 80.0–100.0)
Platelets: 410 10*3/uL — ABNORMAL HIGH (ref 150–400)
RBC: 3.35 MIL/uL — ABNORMAL LOW (ref 3.87–5.11)
RDW: 14.6 % (ref 11.5–15.5)
WBC: 15.1 10*3/uL — ABNORMAL HIGH (ref 4.0–10.5)
nRBC: 0 % (ref 0.0–0.2)

## 2019-11-09 LAB — MAGNESIUM: Magnesium: 2.1 mg/dL (ref 1.7–2.4)

## 2019-11-09 LAB — BPAM RBC
Blood Product Expiration Date: 202107062359
Blood Product Expiration Date: 202107222359
Blood Product Expiration Date: 202107222359
ISSUE DATE / TIME: 202106261508
Unit Type and Rh: 1700
Unit Type and Rh: 7300
Unit Type and Rh: 7300

## 2019-11-09 MED ORDER — ENSURE ENLIVE PO LIQD
237.0000 mL | Freq: Two times a day (BID) | ORAL | Status: DC
  Administered 2019-11-09 – 2019-11-10 (×3): 237 mL via ORAL

## 2019-11-09 MED ORDER — CLOPIDOGREL BISULFATE 75 MG PO TABS
75.0000 mg | ORAL_TABLET | Freq: Every day | ORAL | Status: DC
  Administered 2019-11-10: 75 mg via ORAL
  Filled 2019-11-09: qty 1

## 2019-11-09 MED ORDER — POTASSIUM CHLORIDE 20 MEQ PO PACK
40.0000 meq | PACK | Freq: Once | ORAL | Status: AC
  Administered 2019-11-09: 40 meq via ORAL
  Filled 2019-11-09: qty 2

## 2019-11-09 MED ORDER — AMOXICILLIN-POT CLAVULANATE 875-125 MG PO TABS: 1.0000 | ORAL_TABLET | Freq: Two times a day (BID) | ORAL | Status: DC

## 2019-11-09 MED ORDER — AMOXICILLIN-POT CLAVULANATE 500-125 MG PO TABS
1.0000 | ORAL_TABLET | Freq: Two times a day (BID) | ORAL | Status: DC
  Administered 2019-11-09 – 2019-11-10 (×3): 500 mg via ORAL
  Filled 2019-11-09 (×6): qty 1

## 2019-11-09 NOTE — Evaluation (Addendum)
Physical Therapy Evaluation Patient Details Name: IYLAH DWORKIN MRN: [id number] DOB: 1920-10-17 Today's Date: 11/09/2019   History of Present Illness  86 y.o.female with medical history significant for peripheral vascular disease, chronic kidney disease, arthritis, GERD and hypertension who presents to the emergency room for evaluation of severe pain involving the right leg. Now s/p right AKA on 11/06/19.  Clinical Impression  Pt showed good effort and showed age appropriate functional strength in U&LEs with testing.  However with standing she had buckling in L knee and even with assist to [facility] and heavy cuing to do any hopping/heel-toe/etc she was unable to [facility] with UEs/walker enough to attempt any mobility at EOB.  Pt will needs STR at discharge and very likely will need a w/c for safe mobility per improvement with hopping/unweighting at rehab facility.    Follow Up Recommendations SNF    Equipment Recommendations   (TBD at next venue of care)    Recommendations for Other Services       Precautions / Restrictions Precautions Precautions: Fall Restrictions Weight Bearing Restrictions: Yes RLE Weight Bearing: Non weight bearing      Mobility  Bed Mobility Overal bed mobility: Needs Assistance Bed Mobility: Supine to Sit     Supine to sit: Min guard     General bed mobility comments: Pt was able to transition to EOB w/o direct assist, cuing for sequencing and use of UEs  Transfers Overall transfer level: Needs assistance Equipment used: Rolling walker (2 wheeled) Transfers: Sit to/from Stand Sit to Stand: Mod assist;Max assist         General transfer comment: multiple standing bouts with varying need for assist.  First attempt pt unable to get to fully upright and had R knee buckling issues.  Better, but still unsteady with subsequent standing efforts  Ambulation/Gait             General Gait Details: unable to unweight L LE with UEs enough to move foot  even minimally (any direction)  Stairs            Wheelchair Mobility    Modified Rankin (Stroke Patients Only)       Balance Overall balance assessment: Needs assistance Sitting-balance support: No upper extremity supported Sitting balance-Leahy Scale: Good Sitting balance - Comments: posterior lean noted c BUE overhead      Standing balance-Leahy Scale: Poor Standing balance comment: highly reliant on UEs/walker, able to maintain static standing with close CGA one assisted to upright but guarded and R knee indicating fatigue and close to buckling much of static standing attempt                             Pertinent Vitals/Pain Pain Assessment: No/denies pain    Home Living Family/patient expects to be discharged to:: Skilled nursing facility                      Prior Function Level of Independence: Independent with assistive device(s)         Comments: Pt indicates that she was able to be relatively active, not reliant on walker, out of the house some     Hand Dominance   Dominant Hand: Right    Extremity/Trunk Assessment   Upper Extremity Assessment Upper Extremity Assessment: Generalized weakness;Overall WFL for tasks assessed (age appropriate limitations, good tricep strength)    Lower Extremity Assessment Lower Extremity Assessment: Overall WFL for tasks assessed (3+ to  4-/5 strength in L LE and R hip) RLE Deficits / Details: R AKA        Communication   Communication: HOH  Cognition Arousal/Alertness: Awake/alert Behavior During Therapy: WFL for tasks assessed/performed Overall Cognitive Status: Within Functional Limits for tasks assessed                                 General Comments: Increased time and multimodal cues to follow 2 step commands      General Comments      Exercises Other Exercises Other Exercises: introduced some basic post amputation exercises/positioning - pt HOH making this  somewhat difficult   Assessment/Plan    PT Assessment Patient needs continued PT services  PT Problem List Decreased strength;Decreased range of motion;Decreased activity tolerance;Decreased balance;Decreased mobility;Decreased coordination;Decreased safety awareness;Decreased knowledge of use of DME;Decreased knowledge of precautions;Pain       PT Treatment Interventions DME instruction;Gait training;Functional mobility training;Therapeutic activities;Therapeutic exercise;Balance training;Neuromuscular re-education;Patient/family education    PT Goals (Current goals can be found in the Care Plan section)  Acute Rehab PT Goals Patient Stated Goal: eventually get back home with family PT Goal Formulation: With patient Time For Goal Achievement: 11/23/19 Potential to Achieve Goals: Fair    Frequency 7X/week   Barriers to discharge        Co-evaluation               AM-PAC PT "6 Clicks" Mobility  Outcome Measure Help needed turning from your back to your side while in a flat bed without using bedrails?: None Help needed moving from lying on your back to sitting on the side of a flat bed without using bedrails?: A Little Help needed moving to and from a bed to a chair (including a wheelchair)?: Total Help needed standing up from a chair using your arms (e.g., wheelchair or bedside chair)?: Total Help needed to walk in hospital room?: Total Help needed climbing 3-5 steps with a railing? : Total 6 Click Score: 11    End of Session Equipment Utilized During Treatment: Gait belt Activity Tolerance: Patient tolerated treatment well;Patient limited by fatigue Patient left: with bed alarm set;with call bell/phone within reach (seated EOB wanting to eat more of her lunch) Nurse Communication: Mobility status PT Visit Diagnosis: Muscle weakness (generalized) (M62.81);Difficulty in walking, not elsewhere classified (R26.2)    Time: 1017-5102 PT Time Calculation (min) (ACUTE  ONLY): 32 min   Charges:   PT Evaluation $PT Eval Low Complexity: 1 Low PT Treatments $Therapeutic Activity: 8-22 mins        Kreg Shropshire, DPT 11/09/2019, 4:31 PM

## 2019-11-09 NOTE — NC FL2 (Signed)
[place] LEVEL OF CARE SCREENING TOOL     IDENTIFICATION  Patient Name: [name] Birthdate: 1920/10/07 Sex: female Admission Date (Current Location): 11/05/2019  [place] and [place] Number:  Engineering geologist and Address:  [facility], [address], [place], [place] [address]      Provider Number:    Attending Physician Name and Address:  Enzo Bi, MD  Relative Name and Phone Number:  Odette Fraction (Daughter) [phone]    Current Level of Care:   Recommended Level of Care: [place] Prior Approval Number:    Date Approved/Denied:   PASRR Number: [id number]  Discharge Plan: SNF    Current Diagnoses: Patient Active Problem List   Diagnosis Date Noted  . Gangrene of right foot ([place]) 11/05/2019  . Gangrene due to peripheral vascular disease ([place]) 11/05/2019  . Atherosclerosis of native arteries of the extremities with ulceration ([place]) 10/17/2019  . Bedbug bite 04/22/2019  . Hypotension 04/22/2019  . Gout 04/22/2019  . AKI (acute kidney injury) ([place]) 04/22/2019  . Cognitive impairment 04/22/2019  . Acute anemia 04/21/2019  . HTN (hypertension) 08/02/2016  . UGIB (upper gastrointestinal bleed) 08/02/2016  . Acute blood loss anemia 08/02/2016  . Climacteric arthritis, lower leg 03/22/2016  . Depression 03/22/2016  . Esophageal reflux 03/22/2016  . Osteoarthritis of knee 09/07/2015  . Closed fracture of second cervical vertebra ([place]) 09/01/2015  . Abdominal pain 03/17/2015  . CKD (chronic kidney disease) stage 4, GFR 15-29 ml/min (HCC) 04/28/2014    Orientation RESPIRATION BLADDER Height & Weight     Self, Place    Incontinent Weight: 115 lb 1.3 oz (52.2 kg) Height:  5' (152.4 cm)  BEHAVIORAL SYMPTOMS/MOOD NEUROLOGICAL BOWEL NUTRITION STATUS      Incontinent    AMBULATORY STATUS COMMUNICATION OF NEEDS Skin   Extensive Assist Verbally                         Personal Care Assistance  Level of Assistance  Bathing, Feeding, Dressing, Total care Bathing Assistance: Maximum assistance Feeding assistance: Maximum assistance Dressing Assistance: Maximum assistance Total Care Assistance: Maximum assistance   Functional Limitations Info             SPECIAL CARE FACTORS FREQUENCY                       Contractures      Additional Factors Info  Code Status Code Status Info: Full             Current Medications (11/09/2019):  This is the current hospital active medication list Current Facility-Administered Medications  Medication Dose Route Frequency Provider Last Rate Last Admin  . allopurinol (ZYLOPRIM) tablet 300 mg  300 mg Oral Daily Algernon Huxley, MD   300 mg at 11/08/19 0843  . amLODipine (NORVASC) tablet 5 mg  5 mg Oral Daily Algernon Huxley, MD   5 mg at 11/08/19 0844  . amoxicillin-clavulanate (AUGMENTIN) 500-125 MG per tablet 500 mg  1 tablet Oral BID Rocky Morel, RPH      . aspirin chewable tablet 81 mg  81 mg Oral Daily Rocky Morel, RPH      . atorvastatin (LIPITOR) tablet 10 mg  10 mg Oral Daily Algernon Huxley, MD   10 mg at 11/08/19 0842  . colchicine tablet 0.6 mg  0.6 mg Oral Daily PRN Enzo Bi, MD      .  docusate (COLACE) 50 MG/5ML liquid 200 mg  200 mg Oral BID Rocky Morel, RPH   200 mg at 11/08/19 2151  . gabapentin (NEURONTIN) 250 MG/5ML solution 300 mg  300 mg Oral TID Rocky Morel, RPH   300 mg at 11/08/19 2344  . heparin injection 5,000 Units  5,000 Units Subcutaneous Q8H Algernon Huxley, MD   5,000 Units at 11/09/19 0615  . hydrALAZINE (APRESOLINE) tablet 50 mg  50 mg Oral BID Algernon Huxley, MD   50 mg at 11/08/19 2149  . HYDROcodone-acetaminophen (NORCO/VICODIN) 5-325 MG per tablet 1-2 tablet  1-2 tablet Oral Q6H PRN Algernon Huxley, MD   2 tablet at 11/07/19 0936  . iron polysaccharides (NIFEREX) capsule 150 mg  150 mg Oral q morning - 10a Algernon Huxley, MD   150 mg at 11/08/19 0843  . metoprolol tartrate (LOPRESSOR) tablet 25 mg  25  mg Oral BID Algernon Huxley, MD   25 mg at 11/08/19 2149  . morphine 2 MG/ML injection 2 mg  2 mg Intravenous Q4H PRN Algernon Huxley, MD   2 mg at 11/09/19 0236  . ondansetron (ZOFRAN) tablet 4 mg  4 mg Oral Q6H PRN Algernon Huxley, MD       Or  . ondansetron (ZOFRAN) injection 4 mg  4 mg Intravenous Q6H PRN Algernon Huxley, MD   4 mg at 11/05/19 1040  . pantoprazole sodium (PROTONIX) 40 mg/20 mL oral suspension 40 mg  40 mg Oral Daily Rocky Morel, RPH      . potassium chloride (KLOR-CON) packet 40 mEq  40 mEq Oral Once Enzo Bi, MD      . sertraline (ZOLOFT) tablet 25 mg  25 mg Oral Q breakfast Algernon Huxley, MD   25 mg at 11/08/19 9480     Discharge Medications: Please see discharge summary for a list of discharge medications.  Relevant Imaging Results:  Relevant Lab Results:   Additional Information SS# [id number]  Meriel Flavors, LCSW

## 2019-11-09 NOTE — Evaluation (Signed)
Occupational Therapy Evaluation Patient Details Name: [name] MRN: [id number] DOB: 08-05-1920 Today's Date: 11/09/2019    History of Present Illness [name] is a 86 y.o. AA female with medical history significant for peripheral vascular disease, chronic kidney disease, arthritis, GERD and hypertension who presents to the emergency room for evaluation of severe pain involving the right leg. Now s/p right AKA on 11/06/19.   Clinical Impression   [name] was seen for OT evaluation this date. Prior to hospital admission, pt was MOD I for ADLs and required assist for IADLs. Pt presents to acute OT demonstrating impaired ADL performance and functional mobility 2/2 decreased functional strength/balance, decreased activity tolerance, and decreased [place]. Pt currently requires CGA don/doff B sock seated EOB. SETUP self-feeding sitting at EOB. MIN A simulated UBD - posterior lean noted sitting EOB c overhead mobility. Pt reports fatigue following PT session - ADL t/f assessment deferred. Pt would benefit from skilled OT to address noted impairments and functional limitations (see below for any additional details) in order to maximize safety and independence while minimizing falls risk and caregiver burden. Upon hospital discharge, recommend STR to maximize pt safety and return to PLOF.     Follow Up Recommendations  SNF    Equipment Recommendations   (TBD at next venue of care)    Recommendations for Other Services       Precautions / Restrictions Precautions Precautions: Fall Restrictions Weight Bearing Restrictions: Yes RLE Weight Bearing: Non weight bearing      Mobility Bed Mobility               General bed mobility comments: Pt received and left seated EOB  Transfers                 General transfer comment: Not tested this session     Balance Overall balance assessment: Needs assistance Sitting-balance support:  (LLE supported) Sitting balance-Leahy  Scale: Good Sitting balance - Comments: posterior lean noted c BUE overhead                                    ADL either performed or assessed with clinical judgement   ADL Overall ADL's : Needs assistance/impaired                                       General ADL Comments: CGA don/doff B sock seated EOB. SETUP self-feeding sitting at EOB. MIN A simulated UBD - posterior lean noted sitting EOB c overhead mobility      Vision         Perception     Praxis      Pertinent Vitals/Pain Pain Assessment: No/denies pain     Hand Dominance Right   Extremity/Trunk Assessment Upper Extremity Assessment Upper Extremity Assessment: Generalized weakness   Lower Extremity Assessment Lower Extremity Assessment: RLE deficits/detail;Generalized weakness RLE Deficits / Details: R AKA        Communication Communication Communication: No difficulties   Cognition Arousal/Alertness: Awake/alert Behavior During Therapy: WFL for tasks assessed/performed Overall Cognitive Status: Within Functional Limits for tasks assessed                                 General Comments: Increased time and multimodal cues to follow  2 step commands   General Comments       Exercises Exercises: Other exercises Other Exercises Other Exercises: Pt educated re: OT role, DME recs, falls prevention, ECS, dressing techniques Other Exercises: LBD, self-feeding/drinking, sitting balance/tolerance, simulated UBD   Shoulder Instructions      Home Living Family/patient expects to be discharged to:: Skilled nursing facility                                        Prior Functioning/Environment Level of Independence: Independent with assistive device(s)        Comments: Pt reports MOD I for ADLs (sits to bathe/dress), assist for IADLs        OT Problem List: Decreased strength;Decreased activity tolerance;Impaired balance (sitting and/or  standing);Decreased knowledge of use of DME or AE      OT Treatment/Interventions: Self-care/ADL training;Therapeutic exercise;Energy conservation;DME and/or AE instruction;Therapeutic activities;Patient/family education;Balance training    OT Goals(Current goals can be found in the care plan section) Acute Rehab OT Goals Patient Stated Goal: To return home  OT Goal Formulation: With patient Time For Goal Achievement: 11/23/19 Potential to Achieve Goals: Fair ADL Goals Pt Will Perform Grooming: with modified independence;sitting Pt Will Perform Lower Body Dressing: sitting/lateral leans;with supervision Pt Will Transfer to Toilet: stand pivot transfer;bedside commode;with min assist;with transfer board (c LRAD PRN)  OT Frequency: Min 2X/week   Barriers to D/C: Inaccessible home environment          Co-evaluation              AM-PAC OT "6 Clicks" Daily Activity     Outcome Measure Help from another person eating meals?: None Help from another person taking care of personal grooming?: A Little Help from another person toileting, which includes using toliet, bedpan, or urinal?: A Lot Help from another person bathing (including washing, rinsing, drying)?: A Lot Help from another person to put on and taking off regular upper body clothing?: A Little Help from another person to put on and taking off regular lower body clothing?: A Lot 6 Click Score: 16   End of Session    Activity Tolerance: Patient tolerated treatment well Patient left: in bed;with call bell/phone within reach;with bed alarm set  OT Visit Diagnosis: Unsteadiness on feet (R26.81);Other abnormalities of gait and mobility (R26.89)                Time: 3762-8315 OT Time Calculation (min): 9 min Charges:  OT General Charges $OT Visit: 1 Visit OT Evaluation $OT Eval Low Complexity: 1 Low  Dessie Coma, M.S. OTR/L  11/09/19, 3:55 PM

## 2019-11-09 NOTE — Progress Notes (Signed)
PROGRESS NOTE    [name]  ZOX:[id number] DOB: 1921-01-14 DOA: 11/05/2019 PCP: Jodi Marble, MD    Assessment & Plan:   Principal Problem:   Gangrene of right foot ([place]) Active Problems:   HTN (hypertension)   CKD (chronic kidney disease) stage 4, GFR 15-29 ml/min (HCC)   Depression   Cognitive impairment   Atherosclerosis of native arteries of the extremities with ulceration (HCC)   Gangrene due to peripheral vascular disease (HCC)    [name] is a 86 y.o. AA female with medical history significant for peripheral vascular disease, chronic kidney disease, arthritis, GERD and hypertension who presents to the emergency room for evaluation of severe pain involving the right leg.   Gangrene of right foot with associated cellulitis And severe ischemic pain Patient with known severe peripheral vascular disease status post recent angioplasty S/p right AKA on 6/24 Lactic acid on admission was 2.1 and she has a white count of 16,000 with a left shift Place patient on empiric antibiotic therapy with vancomycin and meropenem adjusted to renal function Wound culture from 09/23/19 yielded pansensitive E. coli and Proteus --OR wound cx also grew pansensitive E. coli and Proteus. PLAN: --switch IV abx to augmentin, per sensitivity --Pain control --PT    Severe peripheral vascular disease Status post recent stent angioplasty  --resume home aspirin and Plavix tomorrow  --Continue statins and beta-blockers  Acute on chronic anemia  2/2 blood loss from surgery --Monitor Hgb and transfuse to keep Hgb >7 --1u pRBC 6/26 for Hgb 6.9.  Hypertension Continue amlodipine, hydralazine and metoprolol   Depression Continue Zoloft   Chronic kidney disease stage IV We will monitor renal function closely during this hospitalization and will request nephrology consult if any worsening   DVT prophylaxis: Heparin SQ Code Status: Full code  Family Communication:  Status  is: inpatient Dispo:   The patient is from: home Anticipated d/c is to: SNF rehab Anticipated d/c date is: whenever bed available Patient currently is medically stable to d/c.   Subjective and Interval History:  No complaint of pain.  Ate meals with assistance.  No fever, N/V, increased swelling.  Dressing over stump site changed today.    Objective: Vitals:   11/09/19 0452 11/09/19 1008 11/09/19 1010 11/09/19 1236  BP: 125/60 (!) 122/50 (!) 122/50 127/60  Pulse: 76  75 69  Resp: 20   16  Temp: 99.6 F (37.6 C)   98.6 F (37 C)  TempSrc: Oral   Oral  SpO2: 100%  100% 100%  Weight:      Height:        Intake/Output Summary (Last 24 hours) at 11/09/2019 1348 Last data filed at 11/08/2019 2200 Gross per 24 hour  Intake 850 ml  Output --  Net 850 ml   Filed Weights   11/05/19 0518 11/06/19 0645  Weight: 52.2 kg 52.2 kg    Examination:   Constitutional: NAD, sleeping, refused to open eyes CV: RRR no M,R,G. Distal pulses +2.  No cyanosis.   RESP: CTA B/L, normal respiratory effort  GI: +BS, NTND Extremities: No effusions, edema in LLE.  Right AKA stump site wrapped. SKIN: warm, dry Neuro: II - XII grossly intact.      Data Reviewed: I have personally reviewed following labs and imaging studies  CBC: Recent Labs  Lab 11/05/19 0531 11/06/19 0533 11/07/19 0433 11/08/19 0731 11/09/19 0504  WBC 16.3* 17.2* 14.3* 15.0* 15.1*  NEUTROABS 12.9*  --   --   --   --  HGB 9.3* 9.3* 7.2* 6.9* 9.2*  HCT 27.8* 27.8* 20.7* 20.9* 27.0*  MCV 78.3* 79.0* 76.7* 78.9* 80.6  PLT 544* 480* 399 408* 096*   Basic Metabolic Panel: Recent Labs  Lab 11/05/19 0531 11/06/19 0533 11/07/19 0433 11/08/19 0731 11/09/19 0504  NA 141 137 137 138 139  K 4.7 4.8 4.0 3.7 3.2*  CL 104 103 105 108 108  CO2 25 26 22 24 23   GLUCOSE 111* 95 77 107* 139*  BUN 45* 34* 25* 21 16  CREATININE 1.52* 1.41* 1.24* 1.19* 1.02*  CALCIUM 9.2 8.5* 7.8* 7.8* 7.9*  MG  --  1.9 1.6* 2.2 2.1    GFR: Estimated Creatinine Clearance: 22.6 mL/min (A) (by C-G formula based on SCr of 1.02 mg/dL (H)). Liver Function Tests: Recent Labs  Lab 11/05/19 0531  AST 27  ALT 24  ALKPHOS 70  BILITOT 0.6  PROT 8.8*  ALBUMIN 2.9*   No results for input(s): LIPASE, AMYLASE in the last 168 hours. No results for input(s): AMMONIA in the last 168 hours. Coagulation Profile: Recent Labs  Lab 11/05/19 0531 11/06/19 0533  INR 1.2 1.2   Cardiac Enzymes: No results for input(s): CKTOTAL, CKMB, CKMBINDEX, TROPONINI in the last 168 hours. BNP (last 3 results) No results for input(s): PROBNP in the last 8760 hours. HbA1C: No results for input(s): HGBA1C in the last 72 hours. CBG: No results for input(s): GLUCAP in the last 168 hours. Lipid Profile: No results for input(s): CHOL, HDL, LDLCALC, TRIG, CHOLHDL, LDLDIRECT in the last 72 hours. Thyroid Function Tests: No results for input(s): TSH, T4TOTAL, FREET4, T3FREE, THYROIDAB in the last 72 hours. Anemia Panel: No results for input(s): VITAMINB12, FOLATE, FERRITIN, TIBC, IRON, RETICCTPCT in the last 72 hours. Sepsis Labs: Recent Labs  Lab 11/05/19 0531 11/05/19 0715  LATICACIDVEN 3.1* 1.7    Recent Results (from the past 240 hour(s))  Blood Culture (routine x 2)     Status: None (Preliminary result)   Collection Time: 11/05/19  5:31 AM   Specimen: BLOOD LEFT WRIST  Result Value Ref Range Status   Specimen Description BLOOD LEFT WRIST  Final   Special Requests   Final    BOTTLES DRAWN AEROBIC AND ANAEROBIC Blood Culture adequate volume   Culture   Final    NO GROWTH 4 DAYS Performed at [facility], [address] [place], [place] [address]    Report Status PENDING  Incomplete  Blood Culture (routine x 2)     Status: None (Preliminary result)   Collection Time: 11/05/19  5:36 AM   Specimen: Left Antecubital; Blood  Result Value Ref Range Status   Specimen Description LEFT ANTECUBITAL  Final   Special Requests    Final    BOTTLES DRAWN AEROBIC AND ANAEROBIC Blood Culture adequate volume   Culture   Final    NO GROWTH 4 DAYS Performed at [facility], [address] [place], [place] [address]    Report Status PENDING  Incomplete  SARS Coronavirus 2 by RT PCR (hospital order, performed in [place] hospital lab) Nasopharyngeal Nasopharyngeal Swab     Status: None   Collection Time: 11/05/19  6:50 AM   Specimen: Nasopharyngeal Swab  Result Value Ref Range Status   SARS Coronavirus 2 NEGATIVE NEGATIVE Final    Comment: (NOTE) SARS-CoV-2 target nucleic acids are NOT DETECTED.  The SARS-CoV-2 RNA is generally detectable in upper and lower respiratory specimens during the acute phase of infection. The lowest concentration of SARS-CoV-2  viral copies this assay can detect is 250 copies / mL. A negative result does not preclude SARS-CoV-2 infection and should not be used as the sole basis for treatment or other patient management decisions.  A negative result may occur with improper specimen collection / handling, submission of specimen other than nasopharyngeal swab, presence of viral mutation(s) within the areas targeted by this assay, and inadequate number of viral copies (<250 copies / mL). A negative result must be combined with clinical observations, patient history, and epidemiological information.  Fact Sheet for Patients:   StrictlyIdeas.no  Fact Sheet for Healthcare Providers: BankingDealers.co.za  This test is not yet approved or  cleared by the [place] FDA and has been authorized for detection and/or diagnosis of SARS-CoV-2 by FDA under an Emergency Use Authorization (EUA).  This EUA will remain in effect (meaning this test can be used) for the duration of the COVID-19 declaration under Section 564(b)(1) of the Act, 21 U.S.C. section 360bbb-3(b)(1), unless the authorization is terminated or revoked sooner.  Performed  at [facility], [address] [place], [place] [address]   Aerobic/Anaerobic Culture (surgical/deep wound)     Status: None (Preliminary result)   Collection Time: 11/05/19 12:36 PM   Specimen: Wound  Result Value Ref Range Status   Specimen Description   Final    WOUND Performed at [facility], [address] [place], [place] [address]    Special Requests   Final    NONE Performed at [facility], [address] [place], [place] [address]    Gram Stain   Final    RARE WBC PRESENT, PREDOMINANTLY PMN ABUNDANT GRAM NEGATIVE RODS ABUNDANT GRAM POSITIVE COCCI IN PAIRS IN CLUSTERS    Culture   Final    MODERATE PROTEUS MIRABILIS FEW ESCHERICHIA COLI FEW BACTEROIDES VULGATUS BETA LACTAMASE POSITIVE CULTURE REINCUBATED FOR BETTER GROWTH Performed at [facility], [place] [address] [place], [place] [address]    Report Status PENDING  Incomplete   Organism ID, Bacteria PROTEUS MIRABILIS  Final   Organism ID, Bacteria ESCHERICHIA COLI  Final      Susceptibility   Escherichia coli - MIC*    AMPICILLIN 8 SENSITIVE Sensitive     CEFAZOLIN <=4 SENSITIVE Sensitive     CEFEPIME <=0.12 SENSITIVE Sensitive     CEFTAZIDIME <=1 SENSITIVE Sensitive     CEFTRIAXONE <=0.25 SENSITIVE Sensitive     CIPROFLOXACIN <=0.25 SENSITIVE Sensitive     GENTAMICIN <=1 SENSITIVE Sensitive     IMIPENEM <=0.25 SENSITIVE Sensitive     TRIMETH/SULFA <=20 SENSITIVE Sensitive     AMPICILLIN/SULBACTAM 4 SENSITIVE Sensitive     PIP/TAZO <=4 SENSITIVE Sensitive     * FEW ESCHERICHIA COLI   Proteus mirabilis - MIC*    AMPICILLIN <=2 SENSITIVE Sensitive     CEFAZOLIN <=4 SENSITIVE Sensitive     CEFEPIME <=0.12 SENSITIVE Sensitive     CEFTAZIDIME <=1 SENSITIVE Sensitive     CEFTRIAXONE <=0.25 SENSITIVE Sensitive     CIPROFLOXACIN <=0.25 SENSITIVE Sensitive     GENTAMICIN <=1 SENSITIVE Sensitive     IMIPENEM 2 SENSITIVE Sensitive     TRIMETH/SULFA <=20 SENSITIVE Sensitive      AMPICILLIN/SULBACTAM <=2 SENSITIVE Sensitive     PIP/TAZO <=4 SENSITIVE Sensitive     * MODERATE PROTEUS MIRABILIS  Urine culture     Status: None   Collection Time: 11/05/19  2:57 PM   Specimen: In/Out Cath Urine  Result Value Ref Range Status  Specimen Description   Final    IN/OUT CATH URINE Performed at [facility], [address], [place], [place] [address]    Special Requests   Final    NONE Performed at [facility], [address], [place], [place] [address]    Culture   Final    NO GROWTH Performed at [facility], [place] [address], [place], [place] [address]    Report Status 11/07/2019 FINAL  Final      Radiology Studies: No results found.   Scheduled Meds: . allopurinol  300 mg Oral Daily  . amLODipine  5 mg Oral Daily  . amoxicillin-clavulanate  1 tablet Oral BID  . aspirin  81 mg Oral Daily  . atorvastatin  10 mg Oral Daily  . docusate  200 mg Oral BID  . feeding supplement (ENSURE ENLIVE)  237 mL Oral BID BM  . gabapentin  300 mg Oral TID  . heparin  5,000 Units Subcutaneous Q8H  . hydrALAZINE  50 mg Oral BID  . iron polysaccharides  150 mg Oral q morning - 10a  . metoprolol tartrate  25 mg Oral BID  . pantoprazole sodium  40 mg Oral Daily  . sertraline  25 mg Oral Q breakfast   Continuous Infusions:    LOS: 4 days     Enzo Bi, MD [facility] If 7PM-7AM, please contact night-coverage 11/09/2019, 1:48 PM

## 2019-11-09 NOTE — Progress Notes (Signed)
PT Cancellation Note  Patient Details Name: [name] MRN: [id number] DOB: 08-May-1921   Cancelled Treatment:    Reason Eval/Treat Not Completed: Other (comment) Attempted to see pt this afternoon.  Nursing/aide report she has been having profuse diarrhea and needs another clean up at this time.  Hope to be able to try back later this afternoon per medical and further diarrhea status.  Regardless she will very likely need STR when discharged from the hospital.  Kreg Shropshire, DPT 11/09/2019, 2:23 PM

## 2019-11-09 NOTE — Progress Notes (Signed)
3 Days Post-Op   Subjective/Chief Complaint: Notes mild discomfort in right stump. Otherwise without complaint. Tolerating more PO this morning.   Objective: Vital signs in last 24 hours: Temp:  [97.9 F (36.6 C)-99.6 F (37.6 C)] 99.6 F (37.6 C) (06/27 0452) Pulse Rate:  [65-79] 75 (06/27 1010) Resp:  [14-21] 20 (06/27 0452) BP: (112-148)/(50-66) 122/50 (06/27 1010) SpO2:  [99 %-100 %] 100 % (06/27 1010) Last BM Date: 11/09/19  Intake/Output from previous day: 06/26 0701 - 06/27 0700 In: 850 [P.O.:480; Blood:370] Out: -  Intake/Output this shift: No intake/output data recorded.  General appearance: alert and no distress Extremities: Right AKA- incision- C/D/I, stump soft, warm, viable  Lab Results:  Recent Labs    11/08/19 0731 11/09/19 0504  WBC 15.0* 15.1*  HGB 6.9* 9.2*  HCT 20.9* 27.0*  PLT 408* 410*   BMET Recent Labs    11/08/19 0731 11/09/19 0504  NA 138 139  K 3.7 3.2*  CL 108 108  CO2 24 23  GLUCOSE 107* 139*  BUN 21 16  CREATININE 1.19* 1.02*  CALCIUM 7.8* 7.9*   PT/INR No results for input(s): LABPROT, INR in the last 72 hours. ABG No results for input(s): PHART, HCO3 in the last 72 hours.  Invalid input(s): PCO2, PO2  Studies/Results: No results found.  Anti-infectives: Anti-infectives (From admission, onward)   Start     Dose/Rate Route Frequency Ordered Stop   11/09/19 1000  amoxicillin-clavulanate (AUGMENTIN) 875-125 MG per tablet 1 tablet  Status:  Discontinued        1 tablet Oral Every 12 hours 11/09/19 0903 11/09/19 0914   11/09/19 1000  amoxicillin-clavulanate (AUGMENTIN) 500-125 MG per tablet 500 mg     Discontinue     1 tablet Oral 2 times daily 11/09/19 0914     11/07/19 0600  vancomycin (VANCOREADY) IVPB 500 mg/100 mL  Status:  Discontinued        500 mg 100 mL/hr over 60 Minutes Intravenous Every 48 hours 11/05/19 0917 11/09/19 0903   11/06/19 0600  ceFAZolin (ANCEF) IVPB 1 g/50 mL premix        1 g 100 mL/hr over 30  Minutes Intravenous On call to O.R. 11/06/19 0118 11/06/19 0745   11/05/19 1800  cefTRIAXone (ROCEPHIN) 2 g in sodium chloride 0.9 % 100 mL IVPB  Status:  Discontinued        2 g 200 mL/hr over 30 Minutes Intravenous Every 24 hours 11/05/19 0916 11/09/19 0903   11/05/19 1000  metroNIDAZOLE (FLAGYL) IVPB 500 mg  Status:  Discontinued        500 mg 100 mL/hr over 60 Minutes Intravenous Every 8 hours 11/05/19 0916 11/09/19 0903   11/05/19 0845  vancomycin (VANCOCIN) IVPB 1000 mg/200 mL premix  Status:  Discontinued        1,000 mg 200 mL/hr over 60 Minutes Intravenous  Once 11/05/19 0833 11/05/19 0900   11/05/19 0845  meropenem (MERREM) 1 g in sodium chloride 0.9 % 100 mL IVPB  Status:  Discontinued        1 g 200 mL/hr over 30 Minutes Intravenous  Once 11/05/19 0833 11/05/19 0916   11/05/19 0630  vancomycin (VANCOCIN) IVPB 1000 mg/200 mL premix        1,000 mg 200 mL/hr over 60 Minutes Intravenous  Once 11/05/19 0617 11/05/19 0751   11/05/19 0615  vancomycin (VANCOCIN) injection 1 g  Status:  Discontinued        1 g Intravenous  Once 11/05/19  0602 11/05/19 0616   11/05/19 0615  cefTRIAXone (ROCEPHIN) 1 g in sodium chloride 0.9 % 100 mL IVPB        1 g 200 mL/hr over 30 Minutes Intravenous  Once 11/05/19 0602 11/05/19 0645      Assessment/Plan: s/p Procedure(s): AMPUTATION ABOVE KNEE (Right) Dressing changed today- stump healthy.  HgB stable after transfusion 1 U pRBC OK for dispo to SNF from Vascular Surgery standpoint when medically stable. [place] for OOB with PT/OT Ensure supplements to assist with nutrition  LOS: 4 days    Jamesetta So A 11/09/2019

## 2019-11-09 NOTE — Progress Notes (Signed)
PHARMACY NOTE:  ANTIMICROBIAL RENAL DOSAGE ADJUSTMENT  Current antimicrobial regimen includes a mismatch between antimicrobial dosage and estimated renal function.  As per policy approved by the Pharmacy & Therapeutics and Medical Executive Committees, the antimicrobial dosage will be adjusted accordingly.  Current antimicrobial dosage:  Augmentin 875-125 mg PO BID  Indication: cellulitis  Renal Function:  Estimated Creatinine Clearance: 22.6 mL/min (A) (by C-G formula based on SCr of 1.02 mg/dL (H)).     Antimicrobial dosage has been changed to:   Augmentin 500-125 mg PO BID   Additional comments:   Thank you for allowing pharmacy to be a part of this patient's care.  Rocky Morel, [facility] 11/09/2019 9:15 AM

## 2019-11-09 NOTE — TOC Progression Note (Addendum)
Transition of Care [facility]) - Progression Note    Patient Details  Name: [name] MRN: [id number] Date of Birth: 09-27-1920  [facility]) CM/SW Contact  Meriel Flavors, LCSW Phone Number: 11/09/2019, 9:53 AM  Clinical Narrative:    CSW completed FL2 with [facility] and sent out waiting for bed offers. Due to regular medicare no Authorization needed.    3:24 pm Patient has received a bed offer from [facility]. CSW has shared information with pt and family, they are discussing and will call to let CSW know if they accept [facility]    Expected Discharge Plan and Services                                                 Social Determinants of Health (SDOH) Interventions    Readmission Risk Interventions Readmission Risk Prevention Plan 04/24/2019  Medication Screening Complete  Transportation Screening Complete  Some recent data might be hidden

## 2019-11-10 LAB — SARS CORONAVIRUS 2 BY RT PCR (HOSPITAL ORDER, PERFORMED IN ~~LOC~~ HOSPITAL LAB): SARS Coronavirus 2: NEGATIVE

## 2019-11-10 LAB — CBC
HCT: 25.7 % — ABNORMAL LOW (ref 36.0–46.0)
Hemoglobin: 8.9 g/dL — ABNORMAL LOW (ref 12.0–15.0)
MCH: 27.3 pg (ref 26.0–34.0)
MCHC: 34.6 g/dL (ref 30.0–36.0)
MCV: 78.8 fL — ABNORMAL LOW (ref 80.0–100.0)
Platelets: 390 10*3/uL (ref 150–400)
RBC: 3.26 MIL/uL — ABNORMAL LOW (ref 3.87–5.11)
RDW: 15.3 % (ref 11.5–15.5)
WBC: 14.2 10*3/uL — ABNORMAL HIGH (ref 4.0–10.5)
nRBC: 0.4 % — ABNORMAL HIGH (ref 0.0–0.2)

## 2019-11-10 LAB — BASIC METABOLIC PANEL
Anion gap: 6 (ref 5–15)
BUN: 15 mg/dL (ref 8–23)
CO2: 24 mmol/L (ref 22–32)
Calcium: 7.9 mg/dL — ABNORMAL LOW (ref 8.9–10.3)
Chloride: 110 mmol/L (ref 98–111)
Creatinine, Ser: 1.03 mg/dL — ABNORMAL HIGH (ref 0.44–1.00)
GFR calc Af Amer: 53 mL/min — ABNORMAL LOW (ref >60)
GFR calc non Af Amer: 46 mL/min — ABNORMAL LOW (ref >60)
Glucose, Bld: 92 mg/dL (ref 70–99)
Potassium: 4.1 mmol/L (ref 3.5–5.1)
Sodium: 140 mmol/L (ref 135–145)

## 2019-11-10 LAB — CULTURE, BLOOD (ROUTINE X 2)
Culture: NO GROWTH
Culture: NO GROWTH
Special Requests: ADEQUATE
Special Requests: ADEQUATE

## 2019-11-10 LAB — MAGNESIUM: Magnesium: 1.8 mg/dL (ref 1.7–2.4)

## 2019-11-10 LAB — SURGICAL PATHOLOGY

## 2019-11-10 MED ORDER — HYDROCODONE-ACETAMINOPHEN 5-325 MG PO TABS: 1.0000 | ORAL_TABLET | Freq: Four times a day (QID) | ORAL | 0 refills | Status: AC | PRN

## 2019-11-10 MED ORDER — ENSURE ENLIVE PO LIQD: 237.0000 mL | Freq: Two times a day (BID) | ORAL | 12 refills | Status: DC

## 2019-11-10 MED ORDER — AMOXICILLIN-POT CLAVULANATE 500-125 MG PO TABS: 1.0000 | ORAL_TABLET | Freq: Two times a day (BID) | ORAL | 0 refills | Status: AC

## 2019-11-10 NOTE — Progress Notes (Signed)
[facility] Daily Progress Note   Subjective: 11/06/19: Right above-the-knee amputation  No issues overnight.  Objective: Vitals:   11/09/19 1010 11/09/19 1236 11/09/19 2136 11/10/19 0458  BP: (!) 122/50 127/60 133/62 (!) 128/54  Pulse: 75 69 83 69  Resp:  16 18 18   Temp:  98.6 F (37 C) 98.6 F (37 C) 98.7 F (37.1 C)  TempSrc:  Oral Oral Oral  SpO2: 100% 100% 100% 100%  Weight:      Height:       No intake or output data in the 24 hours ending 11/10/19 1143  Physical Exam: A&Ox1, NAD CV: RRR Pulmonary: CTA Bilaterally Abdomen: Soft, Nontender, Nondistended Vascular:             Right Lower Extremity: Thigh soft. Dressing clean, dry and intact.   Laboratory: CBC    Component Value Date/Time   WBC 14.2 (H) 11/10/2019 0457   HGB 8.9 (L) 11/10/2019 0457   HGB 9.3 (L) 06/13/2014 1943   HCT 25.7 (L) 11/10/2019 0457   HCT 29.0 (L) 06/13/2014 1943   PLT 390 11/10/2019 0457   PLT 316 06/13/2014 1943   BMET    Component Value Date/Time   NA 140 11/10/2019 0457   NA 146 (H) 08/03/2014 1006   NA 140 06/13/2014 1943   K 4.1 11/10/2019 0457   K 4.0 06/13/2014 1943   CL 110 11/10/2019 0457   CL 101 06/13/2014 1943   CO2 24 11/10/2019 0457   CO2 33 (H) 06/13/2014 1943   GLUCOSE 92 11/10/2019 0457   GLUCOSE 99 06/13/2014 1943   BUN 15 11/10/2019 0457   BUN 8 (L) 08/03/2014 1006   BUN 14 06/13/2014 1943   CREATININE 1.03 (H) 11/10/2019 0457   CREATININE 1.10 06/13/2014 1943   CALCIUM 7.9 (L) 11/10/2019 0457   CALCIUM 9.0 06/13/2014 1943   GFRNONAA 46 (L) 11/10/2019 0457   GFRNONAA 49 (L) 06/13/2014 1943   GFRAA 53 (L) 11/10/2019 0457   GFRAA 60 (L) 06/13/2014 1943   Assessment/Planning: 86 year old female s/p RLE AKA  POD#4  1) H&H Stable 2) Leukocytosis improving 3) Daily stump dressing change 4) PT / OT / Ambulation  Discussed with Dr. Ellis Parents Payden Docter PA-C 11/10/2019 11:43 AM

## 2019-11-10 NOTE — TOC Transition Note (Signed)
Transition of Care [facility]) - CM/SW Discharge Note   Patient Details  Name: [name] MRN: [id number] Date of Birth: Aug 18, 1920  [facility] CM/SW Contact:  Beverly Sessions, RN Phone Number: 11/10/2019, 4:30 PM   Clinical Narrative:    RNCM confirmed with daughter [name] that family has accepted bed at [facility] Patient to discharge today DC info sent in the [facility]  EMS packet printed Bedside RN to call report  Repeat covid test ordered.  EMS can be called after negative covid results have been obtained .  Bedside RN aware    Final next level of care: [place] Barriers to Discharge: No Barriers Identified   Patient Goals and CMS Choice        Discharge Placement              Patient chooses bed at: [facility] Patient to be transferred to facility by: EMS      Discharge Plan and Services                                     Social Determinants of Health (SDOH) Interventions     Readmission Risk Interventions Readmission Risk Prevention Plan 04/24/2019  Medication Screening Complete  Transportation Screening Complete  Some recent data might be hidden

## 2019-11-10 NOTE — Progress Notes (Signed)
Report called to [employer]. Spoke with Deatra Ina, RN

## 2019-11-10 NOTE — Discharge Summary (Addendum)
Physician Discharge Summary   AKYLA VAVREK  female DOB: 21-Jan-1921  QTM:[id number]  PCP: Jodi Marble, MD  Admit date: 11/05/2019 Discharge date: 11/10/2019  Admitted From: home Disposition:  SNF CODE STATUS: Full code  Discharge Instructions    No wound care   Complete by: As directed        Hospital Course:  For full details, please see H&P, progress notes, consult notes and ancillary notes.  Briefly,  Natalie Morneault [name] a 86 y.o.AA femalewith medical history significant forperipheral vascular disease, chronic kidney disease,arthritis, GERD and hypertension who presented to the emergency room for evaluation of severe pain involving the right leg.   # Severe ischemic pain, resolved # Gangrene of right footwith associated cellulitis S/p right AKA on 6/24 Lactic acid on admission was 2.1 and she has a white count of 16,000 with a left shift. Patient started on empiric antibiotic therapywith vancomycin and meropenem adjusted to renal function.  Pt went for right AKA on 6/24 and tolerated it well with routine healing.  Wound culture from 09/23/19 yieldedpansensitive E. coli and Proteus.  OR wound cx also grew pansensitive E. coli and Proteus.  IV abx were switched to Augmentin to be continued for 7 days after discharge.  Pt will follow up with Vascular Dr. Lucky Cowboy 1-2 weeks after discharge.  Severe peripheral vascular disease s/p recent stent angioplasty After surgery, pt was resumed on home aspirin and Plavix.  Continued statins.  Acute on chronic anemia  2/2 blood loss from surgery Pt received 1u pRBC on 6/26 for Hgb 6.9.  Hgb remained >7 and stable after transfusion, and was 8.9 on the day of discharge.  Hypertension Continued home amlodipine,hydralazine and metoprolol  Depression Continued Zoloft  AKI, POA, resolved Chronic kidney disease stage 3a Cr 1.52 on presentation, improved to 1.03 on the day of discharge.  AKI likely due to infection and  dehydration.   Discharge Diagnoses:  Principal Problem:   Gangrene of right foot ([place]) Active Problems:   HTN (hypertension)   CKD (chronic kidney disease) stage 4, GFR 15-29 ml/min (HCC)   Depression   Cognitive impairment   Atherosclerosis of native arteries of the extremities with ulceration (HCC)   Gangrene due to peripheral vascular disease [facility])    Discharge Instructions:  Allergies as of 11/10/2019   No Known Allergies     Medication List    STOP taking these medications   diclofenac Sodium 1 % Gel Commonly known as: VOLTAREN   HYDROcodone-acetaminophen 7.5-325 MG tablet Commonly known as: NORCO Replaced by: HYDROcodone-acetaminophen 5-325 MG tablet   LORazepam 0.5 MG tablet Commonly known as: ATIVAN   morphine CONCENTRATE 10 mg / 0.5 ml concentrated solution   UNABLE TO FIND     TAKE these medications   allopurinol 300 MG tablet Commonly known as: ZYLOPRIM Take 300 mg by mouth daily.   amLODipine 5 MG tablet Commonly known as: NORVASC Take 1 tablet (5 mg total) by mouth daily.   amoxicillin-clavulanate 500-125 MG tablet Commonly known as: AUGMENTIN Take 1 tablet (500 mg total) by mouth 2 (two) times daily for 7 days.   aspirin EC 81 MG tablet Take 1 tablet (81 mg total) by mouth daily.   atorvastatin 10 MG tablet Commonly known as: Lipitor Take 1 tablet (10 mg total) by mouth daily.   clopidogrel 75 MG tablet Commonly known as: Plavix Take 1 tablet (75 mg total) by mouth daily.   Colcrys 0.6 MG tablet Generic drug: colchicine Take 1  tablet by mouth daily as needed.   feeding supplement (ENSURE ENLIVE) Liqd Take 237 mLs by mouth 2 (two) times daily between meals. Start taking on: November 11, 2019   gabapentin 300 MG capsule Commonly known as: NEURONTIN Take 300 mg by mouth 3 (three) times daily.   hydrALAZINE 50 MG tablet Commonly known as: APRESOLINE Take 50 mg by mouth 2 (two) times daily.   HYDROcodone-acetaminophen 5-325 MG  tablet Commonly known as: NORCO/VICODIN Take 1 tablet by mouth every 6 (six) hours as needed for up to 5 days for moderate pain. Replaces: HYDROcodone-acetaminophen 7.5-325 MG tablet   hyoscyamine 0.125 MG SL tablet Commonly known as: LEVSIN SL Place 0.125 mg under the tongue every 4 (four) hours as needed.   metoprolol tartrate 25 MG tablet Commonly known as: LOPRESSOR Take 25 mg by mouth 2 (two) times daily.   pantoprazole 40 MG tablet Commonly known as: PROTONIX Pantoprazole Sodium 40 MG Oral Tablet Delayed Release QTY: 30 each Days: 30 Refills: 2  Written: 07/05/18 Patient Instructions: TAKE 1 TABLET BY MOUTH DAILY   Poly-Iron 150 Forte 150-0.025-1 MG Caps Generic drug: Iron Polysacch Cmplx-B12-FA Take 1 capsule by mouth every morning.   sertraline 25 MG tablet Commonly known as: ZOLOFT Take 25 mg by mouth daily with breakfast.   tiZANidine 2 MG tablet Commonly known as: ZANAFLEX Take 4 mg by mouth at bedtime.        Contact information for follow-up providers    Jodi Marble, MD. Schedule an appointment as soon as possible for a visit in 1 week(s).   Specialty: Internal Medicine Contact information: [address] [place] [address] [phone]        Algernon Huxley, MD Follow up.   Specialties: Vascular Surgery, Radiology, Interventional Cardiology Contact information: [place] [address] [phone]            Contact information for after-discharge care    [facility] .   Service: Skilled Nursing Contact information: [place] [phone]                  No Known Allergies   The results of significant diagnostics from this hospitalization (including imaging, microbiology, ancillary and laboratory) are listed below for reference.   Consultations:   Procedures/Studies: PERIPHERAL VASCULAR CATHETERIZATION  Result Date:  10/20/2019 See op note  DG Foot Complete Right  Result Date: 11/05/2019 CLINICAL DATA:  Right leg pain.  Osteomyelitis. EXAM: RIGHT FOOT COMPLETE - 3+ VIEW COMPARISON:  Right foot radiographs 09/11/2019 FINDINGS: Bone is scratched at the foot is diffusely demineralized. No focal erosive lesions are present. Microvascular calcifications are evident. No acute or healing fracture is present. IMPRESSION: 1. No acute or healing fracture. 2. Diffuse demineralization. 3. Microvascular calcifications suggesting diabetes. 4. No focal osseous erosion. While osteomyelitis is not identified, it is not excluded. Electronically Signed   By: San Morelle M.D.   On: 11/05/2019 07:21      Labs: BNP (last 3 results) No results for input(s): BNP in the last 8760 hours. Basic Metabolic Panel: Recent Labs  Lab 11/06/19 0533 11/07/19 0433 11/08/19 0731 11/09/19 0504 11/10/19 0457  NA 137 137 138 139 140  K 4.8 4.0 3.7 3.2* 4.1  CL 103 105 108 108 110  CO2 26 22 24 23 24   GLUCOSE 95 77 107* 139* 92  BUN 34* 25* 21 16 15   CREATININE 1.41* 1.24* 1.19*  1.02* 1.03*  CALCIUM 8.5* 7.8* 7.8* 7.9* 7.9*  MG 1.9 1.6* 2.2 2.1 1.8   Liver Function Tests: Recent Labs  Lab 11/05/19 0531  AST 27  ALT 24  ALKPHOS 70  BILITOT 0.6  PROT 8.8*  ALBUMIN 2.9*   No results for input(s): LIPASE, AMYLASE in the last 168 hours. No results for input(s): AMMONIA in the last 168 hours. CBC: Recent Labs  Lab 11/05/19 0531 11/05/19 0531 11/06/19 0533 11/07/19 0433 11/08/19 0731 11/09/19 0504 11/10/19 0457  WBC 16.3*   < > 17.2* 14.3* 15.0* 15.1* 14.2*  NEUTROABS 12.9*  --   --   --   --   --   --   HGB 9.3*   < > 9.3* 7.2* 6.9* 9.2* 8.9*  HCT 27.8*   < > 27.8* 20.7* 20.9* 27.0* 25.7*  MCV 78.3*   < > 79.0* 76.7* 78.9* 80.6 78.8*  PLT 544*   < > 480* 399 408* 410* 390   < > = values in this interval not displayed.   Cardiac Enzymes: No results for input(s): CKTOTAL, CKMB, CKMBINDEX, TROPONINI in the  last 168 hours. BNP: Invalid input(s): POCBNP CBG: No results for input(s): GLUCAP in the last 168 hours. D-Dimer No results for input(s): DDIMER in the last 72 hours. Hgb A1c No results for input(s): HGBA1C in the last 72 hours. Lipid Profile No results for input(s): CHOL, HDL, LDLCALC, TRIG, CHOLHDL, LDLDIRECT in the last 72 hours. Thyroid function studies No results for input(s): TSH, T4TOTAL, T3FREE, THYROIDAB in the last 72 hours.  Invalid input(s): FREET3 Anemia work up No results for input(s): VITAMINB12, FOLATE, FERRITIN, TIBC, IRON, RETICCTPCT in the last 72 hours. Urinalysis    Component Value Date/Time   COLORURINE YELLOW (A) 11/05/2019 1457   APPEARANCEUR CLEAR (A) 11/05/2019 1457   LABSPEC 1.017 11/05/2019 1457   PHURINE 7.0 11/05/2019 1457   GLUCOSEU NEGATIVE 11/05/2019 1457   HGBUR NEGATIVE 11/05/2019 1457   BILIRUBINUR NEGATIVE 11/05/2019 1457   KETONESUR NEGATIVE 11/05/2019 1457   PROTEINUR NEGATIVE 11/05/2019 1457   NITRITE NEGATIVE 11/05/2019 1457   LEUKOCYTESUR NEGATIVE 11/05/2019 1457   Sepsis Labs Invalid input(s): PROCALCITONIN,  WBC,  LACTICIDVEN Microbiology Recent Results (from the past 240 hour(s))  Blood Culture (routine x 2)     Status: None   Collection Time: 11/05/19  5:31 AM   Specimen: BLOOD LEFT WRIST  Result Value Ref Range Status   Specimen Description BLOOD LEFT WRIST  Final   Special Requests   Final    BOTTLES DRAWN AEROBIC AND ANAEROBIC Blood Culture adequate volume   Culture   Final    NO GROWTH 5 DAYS Performed at [facility], [address], [place], [place] [address]    Report Status 11/10/2019 FINAL  Final  Blood Culture (routine x 2)     Status: None   Collection Time: 11/05/19  5:36 AM   Specimen: Left Antecubital; Blood  Result Value Ref Range Status   Specimen Description LEFT ANTECUBITAL  Final   Special Requests   Final    BOTTLES DRAWN AEROBIC AND ANAEROBIC Blood Culture adequate volume   Culture    Final    NO GROWTH 5 DAYS Performed at [facility], [address] [place], [place] [address]    Report Status 11/10/2019 FINAL  Final  SARS Coronavirus 2 by RT PCR (hospital order, performed in [facility] hospital lab) Nasopharyngeal Nasopharyngeal Swab     Status: None   Collection Time:  11/05/19  6:50 AM   Specimen: Nasopharyngeal Swab  Result Value Ref Range Status   SARS Coronavirus 2 NEGATIVE NEGATIVE Final    Comment: (NOTE) SARS-CoV-2 target nucleic acids are NOT DETECTED.  The SARS-CoV-2 RNA is generally detectable in upper and lower respiratory specimens during the acute phase of infection. The lowest concentration of SARS-CoV-2 viral copies this assay can detect is 250 copies / mL. A negative result does not preclude SARS-CoV-2 infection and should not be used as the sole basis for treatment or other patient management decisions.  A negative result may occur with improper specimen collection / handling, submission of specimen other than nasopharyngeal swab, presence of viral mutation(s) within the areas targeted by this assay, and inadequate number of viral copies (<250 copies / mL). A negative result must be combined with clinical observations, patient history, and epidemiological information.  Fact Sheet for Patients:   StrictlyIdeas.no  Fact Sheet for Healthcare Providers: BankingDealers.co.za  This test is not yet approved or  cleared by the [place] FDA and has been authorized for detection and/or diagnosis of SARS-CoV-2 by FDA under an Emergency Use Authorization (EUA).  This EUA will remain in effect (meaning this test can be used) for the duration of the COVID-19 declaration under Section 564(b)(1) of the Act, 21 U.S.C. section 360bbb-3(b)(1), unless the authorization is terminated or revoked sooner.  Performed at [facility], [address] [place], [place] [address]     Aerobic/Anaerobic Culture (surgical/deep wound)     Status: None (Preliminary result)   Collection Time: 11/05/19 12:36 PM   Specimen: Wound  Result Value Ref Range Status   Specimen Description   Final    WOUND Performed at [facility], [address], [place], [place] [address]    Special Requests   Final    NONE Performed at [facility], [address] [place], [place] [address]    Gram Stain   Final    RARE WBC PRESENT, PREDOMINANTLY PMN ABUNDANT GRAM NEGATIVE RODS ABUNDANT GRAM POSITIVE COCCI IN PAIRS IN CLUSTERS    Culture   Final    MODERATE PROTEUS MIRABILIS FEW ESCHERICHIA COLI FEW BACTEROIDES VULGATUS BETA LACTAMASE POSITIVE CULTURE REINCUBATED FOR BETTER GROWTH Performed at [facility], [place] [address] [place], [place] [address]    Report Status PENDING  Incomplete   Organism ID, Bacteria PROTEUS MIRABILIS  Final   Organism ID, Bacteria ESCHERICHIA COLI  Final      Susceptibility   Escherichia coli - MIC*    AMPICILLIN 8 SENSITIVE Sensitive     CEFAZOLIN <=4 SENSITIVE Sensitive     CEFEPIME <=0.12 SENSITIVE Sensitive     CEFTAZIDIME <=1 SENSITIVE Sensitive     CEFTRIAXONE <=0.25 SENSITIVE Sensitive     CIPROFLOXACIN <=0.25 SENSITIVE Sensitive     GENTAMICIN <=1 SENSITIVE Sensitive     IMIPENEM <=0.25 SENSITIVE Sensitive     TRIMETH/SULFA <=20 SENSITIVE Sensitive     AMPICILLIN/SULBACTAM 4 SENSITIVE Sensitive     PIP/TAZO <=4 SENSITIVE Sensitive     * FEW ESCHERICHIA COLI   Proteus mirabilis - MIC*    AMPICILLIN <=2 SENSITIVE Sensitive     CEFAZOLIN <=4 SENSITIVE Sensitive     CEFEPIME <=0.12 SENSITIVE Sensitive     CEFTAZIDIME <=1 SENSITIVE Sensitive     CEFTRIAXONE <=0.25 SENSITIVE Sensitive     CIPROFLOXACIN <=0.25 SENSITIVE Sensitive     GENTAMICIN <=1 SENSITIVE Sensitive     IMIPENEM 2 SENSITIVE Sensitive  TRIMETH/SULFA <=20 SENSITIVE Sensitive     AMPICILLIN/SULBACTAM <=2 SENSITIVE Sensitive     PIP/TAZO <=4  SENSITIVE Sensitive     * MODERATE PROTEUS MIRABILIS  Urine culture     Status: None   Collection Time: 11/05/19  2:57 PM   Specimen: In/Out Cath Urine  Result Value Ref Range Status   Specimen Description   Final    IN/OUT CATH URINE Performed at [facility], [address] [place], [place] [address]    Special Requests   Final    NONE Performed at [facility], [address], [place], [place] [address]    Culture   Final    NO GROWTH Performed at [facility], [place] [address], [place], [place] [address]    Report Status 11/07/2019 FINAL  Final     Total time spend on discharging this patient, including the last patient exam, discussing the hospital stay, instructions for ongoing care as it relates to all pertinent caregivers, as well as preparing the medical discharge records, prescriptions, and/or referrals as applicable, is 35 minutes.    Enzo Bi, MD  [facility] 11/10/2019, 3:51 PM  If 7PM-7AM, please contact night-coverage

## 2019-11-10 NOTE — Care Management Important Message (Signed)
Important Message  Patient Details  Name: [name] MRN: [id number] Date of Birth: [birth year]   Medicare Important Message Given:  Yes     Dannette Barbara 11/10/2019, 11:48 AM

## 2019-11-10 NOTE — Progress Notes (Signed)
PT Cancellation Note  Patient Details Name: [name] MRN: [id number] DOB: March 27, 1921   Cancelled Treatment:    Reason Eval/Treat Not Completed: Fatigue/lethargy limiting ability to participate  PT session attempted x 3 today.  X 2 am x 1 pm.  Each attempt pt asleep and does not awaken to verbal or light tactile cues.  She does flinch in her sleep but does not open her eyes.  Session deferred today.  [name] 11/10/2019, 2:31 PM

## 2019-11-10 NOTE — Discharge Instructions (Signed)
Change stump dressing daily. Cover stump with ABD, cover with Kerlix, Cover with ACE

## 2019-11-11 LAB — AEROBIC/ANAEROBIC CULTURE W GRAM STAIN (SURGICAL/DEEP WOUND)

## 2019-11-18 ENCOUNTER — Encounter (INDEPENDENT_AMBULATORY_CARE_PROVIDER_SITE_OTHER): Payer: Medicare Other

## 2019-11-18 ENCOUNTER — Ambulatory Visit (INDEPENDENT_AMBULATORY_CARE_PROVIDER_SITE_OTHER): Payer: Medicare Other | Admitting: Nurse Practitioner

## 2019-12-10 ENCOUNTER — Encounter (INDEPENDENT_AMBULATORY_CARE_PROVIDER_SITE_OTHER): Payer: Self-pay | Admitting: Nurse Practitioner

## 2019-12-10 ENCOUNTER — Encounter (INDEPENDENT_AMBULATORY_CARE_PROVIDER_SITE_OTHER): Payer: Medicare Other

## 2019-12-10 ENCOUNTER — Ambulatory Visit (INDEPENDENT_AMBULATORY_CARE_PROVIDER_SITE_OTHER): Payer: Medicare Other | Admitting: Nurse Practitioner

## 2019-12-10 ENCOUNTER — Other Ambulatory Visit: Payer: Self-pay

## 2019-12-10 VITALS — BP 149/63 | HR 57 | Resp 14 | Ht 62.0 in | Wt 132.0 lb

## 2019-12-10 DIAGNOSIS — I10 Essential (primary) hypertension: Secondary | ICD-10-CM

## 2019-12-10 DIAGNOSIS — S78111A Complete traumatic amputation at level between right hip and knee, initial encounter: Secondary | ICD-10-CM

## 2019-12-10 DIAGNOSIS — I7025 Atherosclerosis of native arteries of other extremities with ulceration: Secondary | ICD-10-CM

## 2019-12-15 ENCOUNTER — Encounter (INDEPENDENT_AMBULATORY_CARE_PROVIDER_SITE_OTHER): Payer: Self-pay | Admitting: Nurse Practitioner

## 2019-12-15 NOTE — Progress Notes (Signed)
Subjective:    Patient ID: [name], female    DOB: 08/21/1920, 86 y.o.   MRN: [id number] Chief Complaint  Patient presents with  . Follow-up    post op aka    The patient presents today for evaluation of right above-knee amputation.  The patient previously presented to [facility] with evidence of gangrene.  Today the patient reports that her leg is feeling all right.  She denies any fever, chills, nausea, vomiting or drainage.  The wound is clean dry and intact.  The patient notes her pain is well controlled.   Review of Systems  Skin: Positive for wound.  All other systems reviewed and are negative.      Objective:   Physical Exam Vitals reviewed.  HENT:     Head: Normocephalic.  Cardiovascular:     Rate and Rhythm: Normal rate and regular rhythm.     Pulses: Normal pulses.     Heart sounds: Normal heart sounds.  Pulmonary:     Effort: Pulmonary effort is normal.     Breath sounds: Normal breath sounds.  Musculoskeletal:     Right Lower Extremity: Right leg is amputated above knee.  Skin:    General: Skin is warm and dry.  Neurological:     Mental Status: She is alert and oriented to person, place, and time.  Psychiatric:        Mood and Affect: Mood normal.        Behavior: Behavior normal.        Thought Content: Thought content normal.        Judgment: Judgment normal.     BP (!) 149/63 (BP Location: Right Arm)   Pulse 57   Resp 14   Ht 5\' 2"  (1.575 m)   Wt 132 lb (59.9 kg)   BMI 24.14 kg/m   Past Medical History:  Diagnosis Date  . Arthritis   . GERD (gastroesophageal reflux disease)   . Hypertension   . Neck fracture ([place]) 2015   C2    Social History   Socioeconomic History  . Marital status: Single    Spouse name: Not on file  . Number of children: Not on file  . Years of education: Not on file  . Highest education level: Not on file  Occupational History  . Not on file  Tobacco Use  . Smoking status: Never  Smoker  . Smokeless tobacco: Never Used  Substance and Sexual Activity  . Alcohol use: No    Alcohol/week: 0.0 standard drinks  . Drug use: No  . Sexual activity: Not on file  Other Topics Concern  . Not on file  Social History Narrative  . Not on file   Social Determinants of Health   Financial Resource Strain:   . Difficulty of Paying Living Expenses:   Food Insecurity:   . Worried About Charity fundraiser in the Last Year:   . Arboriculturist in the Last Year:   Transportation Needs:   . Film/video editor (Medical):   Marland Kitchen Lack of Transportation (Non-Medical):   Physical Activity:   . Days of Exercise per Week:   . Minutes of Exercise per Session:   Stress:   . Feeling of Stress :   Social Connections:   . Frequency of Communication with Friends and Family:   . Frequency of Social Gatherings with Friends and Family:   . Attends Religious Services:   . Active Member of  Clubs or Organizations:   . Attends Archivist Meetings:   Marland Kitchen Marital Status:   Intimate Partner Violence:   . Fear of Current or Ex-Partner:   . Emotionally Abused:   Marland Kitchen Physically Abused:   . Sexually Abused:     Past Surgical History:  Procedure Laterality Date  . ABDOMINAL HYSTERECTOMY    . AMPUTATION Right 11/06/2019   Procedure: AMPUTATION ABOVE KNEE;  Surgeon: Algernon Huxley, MD;  Location: [facility] ORS;  Service: General;  Laterality: Right;  . CHOLECYSTECTOMY  08/06/14  . ESOPHAGOGASTRODUODENOSCOPY (EGD) WITH PROPOFOL N/A 08/03/2016   Procedure: ESOPHAGOGASTRODUODENOSCOPY (EGD) WITH PROPOFOL;  Surgeon: Ronald Lobo, MD;  Location: [facility];  Service: Gastroenterology;  Laterality: N/A;  . EYE SURGERY  2015   cataract  . LOWER EXTREMITY ANGIOGRAPHY Right 10/20/2019   Procedure: LOWER EXTREMITY ANGIOGRAPHY;  Surgeon: Algernon Huxley, MD;  Location: [facility];  Service: Cardiovascular;  Laterality: Right;    Family History  Problem Relation Age of Onset  . Rheumatologic  disease Mother     No Known Allergies     Assessment & Plan:   1. Above knee amputation of right lower extremity ([place]) Patient will return in 1 week for the rest of her staple removal.  Patient tolerated stent removal today well.  The wound remained clean dry and intact.  Dry dressing placed.  2. Essential hypertension Continue antihypertensive medications as already ordered, these medications have been reviewed and there are no changes at this time.   3. Atherosclerosis of native arteries of the extremities with ulceration ([place]) Once the patient is well-built we will perform noninvasive studies to adequately monitor her vascular status   Current Outpatient Medications on File Prior to Visit  Medication Sig Dispense Refill  . allopurinol (ZYLOPRIM) 300 MG tablet Take 300 mg by mouth daily.    Marland Kitchen amLODipine (NORVASC) 5 MG tablet Take 1 tablet (5 mg total) by mouth daily. 30 tablet 11  . aspirin EC 81 MG tablet Take 1 tablet (81 mg total) by mouth daily. 150 tablet 2  . atorvastatin (LIPITOR) 10 MG tablet Take 1 tablet (10 mg total) by mouth daily. 30 tablet 11  . clopidogrel (PLAVIX) 75 MG tablet Take 1 tablet (75 mg total) by mouth daily. 30 tablet 11  . COLCRYS 0.6 MG tablet Take 1 tablet by mouth daily as needed.    . feeding supplement, ENSURE ENLIVE, (ENSURE ENLIVE) LIQD Take 237 mLs by mouth 2 (two) times daily between meals. 237 mL 12  . gabapentin (NEURONTIN) 300 MG capsule Take 300 mg by mouth 3 (three) times daily.    . hydrALAZINE (APRESOLINE) 50 MG tablet Take 50 mg by mouth 2 (two) times daily.     . hyoscyamine (LEVSIN SL) 0.125 MG SL tablet Place 0.125 mg under the tongue every 4 (four) hours as needed.    . metoprolol tartrate (LOPRESSOR) 25 MG tablet Take 25 mg by mouth 2 (two) times daily.    . pantoprazole (PROTONIX) 40 MG tablet Pantoprazole Sodium 40 MG Oral Tablet Delayed Release QTY: 30 each Days: 30 Refills: 2  Written: 07/05/18 Patient Instructions: TAKE 1  TABLET BY MOUTH DAILY    . POLY-IRON 150 FORTE 150-25-1 MG-MCG-MG CAPS Take 1 capsule by mouth every morning.    . SENNA LAXATIVE 8.6 MG tablet Take 1 tablet by mouth daily as needed.    . sertraline (ZOLOFT) 25 MG tablet Take 25 mg by mouth daily with breakfast.     .  tiZANidine (ZANAFLEX) 2 MG tablet Take 4 mg by mouth at bedtime.     No current facility-administered medications on file prior to visit.    There are no Patient Instructions on file for this visit. No follow-ups on file.   Kris Hartmann, NP

## 2019-12-18 ENCOUNTER — Observation Stay
Admission: EM | Admit: 2019-12-18 | Discharge: 2019-12-19 | Disposition: A | Discharge status: Home / Self Care | Payer: Medicare Other | Attending: Vascular Surgery | Admitting: Vascular Surgery

## 2019-12-18 ENCOUNTER — Other Ambulatory Visit: Payer: Self-pay

## 2019-12-18 ENCOUNTER — Emergency Department: Payer: Medicare Other

## 2019-12-18 ENCOUNTER — Encounter: Payer: Self-pay | Admitting: Emergency Medicine

## 2019-12-18 ENCOUNTER — Ambulatory Visit (INDEPENDENT_AMBULATORY_CARE_PROVIDER_SITE_OTHER): Payer: Medicare Other | Admitting: Nurse Practitioner

## 2019-12-18 ENCOUNTER — Encounter (INDEPENDENT_AMBULATORY_CARE_PROVIDER_SITE_OTHER): Payer: Self-pay | Admitting: Nurse Practitioner

## 2019-12-18 VITALS — BP 149/64 | HR 59 | Resp 14 | Ht 66.0 in | Wt 105.0 lb

## 2019-12-18 DIAGNOSIS — L02415 Cutaneous abscess of right lower limb: Secondary | ICD-10-CM | POA: Diagnosis not present

## 2019-12-18 DIAGNOSIS — Z89511 Acquired absence of right leg below knee: Secondary | ICD-10-CM | POA: Diagnosis not present

## 2019-12-18 DIAGNOSIS — I129 Hypertensive chronic kidney disease with stage 1 through stage 4 chronic kidney disease, or unspecified chronic kidney disease: Secondary | ICD-10-CM | POA: Diagnosis not present

## 2019-12-18 DIAGNOSIS — Z20822 Contact with and (suspected) exposure to covid-19: Secondary | ICD-10-CM | POA: Insufficient documentation

## 2019-12-18 DIAGNOSIS — Z79899 Other long term (current) drug therapy: Secondary | ICD-10-CM | POA: Insufficient documentation

## 2019-12-18 DIAGNOSIS — Z7901 Long term (current) use of anticoagulants: Secondary | ICD-10-CM | POA: Diagnosis not present

## 2019-12-18 DIAGNOSIS — N184 Chronic kidney disease, stage 4 (severe): Secondary | ICD-10-CM | POA: Insufficient documentation

## 2019-12-18 DIAGNOSIS — Z7982 Long term (current) use of aspirin: Secondary | ICD-10-CM | POA: Insufficient documentation

## 2019-12-18 DIAGNOSIS — T8149XA Infection following a procedure, other surgical site, initial encounter: Secondary | ICD-10-CM

## 2019-12-18 DIAGNOSIS — I10 Essential (primary) hypertension: Secondary | ICD-10-CM

## 2019-12-18 DIAGNOSIS — T8140XA Infection following a procedure, unspecified, initial encounter: Principal | ICD-10-CM

## 2019-12-18 LAB — CBC WITH DIFFERENTIAL/PLATELET
Abs Immature Granulocytes: 0.04 10*3/uL (ref 0.00–0.07)
Basophils Absolute: 0 10*3/uL (ref 0.0–0.1)
Basophils Relative: 0 %
Eosinophils Absolute: 0.1 10*3/uL (ref 0.0–0.5)
Eosinophils Relative: 1 %
HCT: 34.7 % — ABNORMAL LOW (ref 36.0–46.0)
Hemoglobin: 11.2 g/dL — ABNORMAL LOW (ref 12.0–15.0)
Immature Granulocytes: 0 %
Lymphocytes Relative: 20 %
Lymphs Abs: 2.5 10*3/uL (ref 0.7–4.0)
MCH: 27.2 pg (ref 26.0–34.0)
MCHC: 32.3 g/dL (ref 30.0–36.0)
MCV: 84.2 fL (ref 80.0–100.0)
Monocytes Absolute: 0.7 10*3/uL (ref 0.1–1.0)
Monocytes Relative: 5 %
Neutro Abs: 9.1 10*3/uL — ABNORMAL HIGH (ref 1.7–7.7)
Neutrophils Relative %: 74 %
Platelets: 381 10*3/uL (ref 150–400)
RBC: 4.12 MIL/uL (ref 3.87–5.11)
RDW: 15.8 % — ABNORMAL HIGH (ref 11.5–15.5)
WBC: 12.5 10*3/uL — ABNORMAL HIGH (ref 4.0–10.5)
nRBC: 0 % (ref 0.0–0.2)

## 2019-12-18 LAB — COMPREHENSIVE METABOLIC PANEL
ALT: 23 U/L (ref 0–44)
AST: 23 U/L (ref 15–41)
Albumin: 3.5 g/dL (ref 3.5–5.0)
Alkaline Phosphatase: 75 U/L (ref 38–126)
Anion gap: 9 (ref 5–15)
BUN: 26 mg/dL — ABNORMAL HIGH (ref 8–23)
CO2: 27 mmol/L (ref 22–32)
Calcium: 9.7 mg/dL (ref 8.9–10.3)
Chloride: 101 mmol/L (ref 98–111)
Creatinine, Ser: 1.16 mg/dL — ABNORMAL HIGH (ref 0.44–1.00)
GFR calc Af Amer: 46 mL/min — ABNORMAL LOW (ref >60)
GFR calc non Af Amer: 39 mL/min — ABNORMAL LOW (ref >60)
Glucose, Bld: 93 mg/dL (ref 70–99)
Potassium: 4.9 mmol/L (ref 3.5–5.1)
Sodium: 137 mmol/L (ref 135–145)
Total Bilirubin: 0.6 mg/dL (ref 0.3–1.2)
Total Protein: 9.4 g/dL — ABNORMAL HIGH (ref 6.5–8.1)

## 2019-12-18 LAB — SARS CORONAVIRUS 2 BY RT PCR (HOSPITAL ORDER, PERFORMED IN ~~LOC~~ HOSPITAL LAB): SARS Coronavirus 2: NEGATIVE

## 2019-12-18 LAB — LACTIC ACID, PLASMA: Lactic Acid, Venous: 1.3 mmol/L (ref 0.5–1.9)

## 2019-12-18 MED ORDER — ASPIRIN EC 81 MG PO TBEC
81.0000 mg | DELAYED_RELEASE_TABLET | Freq: Every day | ORAL | Status: DC
  Administered 2019-12-19: 81 mg via ORAL
  Filled 2019-12-18: qty 1

## 2019-12-18 MED ORDER — ALLOPURINOL 300 MG PO TABS
300.0000 mg | ORAL_TABLET | Freq: Every day | ORAL | Status: DC
  Administered 2019-12-19: 300 mg via ORAL
  Filled 2019-12-18 (×2): qty 1

## 2019-12-18 MED ORDER — ENOXAPARIN SODIUM 30 MG/0.3ML ~~LOC~~ SOLN
30.0000 mg | SUBCUTANEOUS | Status: DC
  Administered 2019-12-19: 30 mg via SUBCUTANEOUS
  Filled 2019-12-18 (×2): qty 0.3

## 2019-12-18 MED ORDER — HYDRALAZINE HCL 50 MG PO TABS
50.0000 mg | ORAL_TABLET | Freq: Two times a day (BID) | ORAL | Status: DC
  Administered 2019-12-19 (×2): 50 mg via ORAL
  Filled 2019-12-18 (×2): qty 1

## 2019-12-18 MED ORDER — CLOPIDOGREL BISULFATE 75 MG PO TABS
75.0000 mg | ORAL_TABLET | Freq: Every day | ORAL | Status: DC
  Administered 2019-12-19: 75 mg via ORAL
  Filled 2019-12-18: qty 1

## 2019-12-18 MED ORDER — ONDANSETRON HCL 4 MG PO TABS: 4.0000 mg | ORAL_TABLET | Freq: Four times a day (QID) | ORAL | Status: DC | PRN

## 2019-12-18 MED ORDER — ACETAMINOPHEN 325 MG PO TABS
650.0000 mg | ORAL_TABLET | Freq: Four times a day (QID) | ORAL | Status: DC | PRN
  Administered 2019-12-19: 650 mg via ORAL
  Filled 2019-12-18: qty 2

## 2019-12-18 MED ORDER — POLYSACCHARIDE IRON COMPLEX 150 MG PO CAPS
150.0000 mg | ORAL_CAPSULE | Freq: Every morning | ORAL | Status: DC
  Administered 2019-12-19: 150 mg via ORAL
  Filled 2019-12-18: qty 1

## 2019-12-18 MED ORDER — SODIUM CHLORIDE 0.9 % IV SOLN: INTRAVENOUS | Status: DC

## 2019-12-18 MED ORDER — POLYETHYLENE GLYCOL 3350 17 G PO PACK: 17.0000 g | PACK | Freq: Every day | ORAL | Status: DC | PRN

## 2019-12-18 MED ORDER — GABAPENTIN 300 MG PO CAPS
300.0000 mg | ORAL_CAPSULE | Freq: Three times a day (TID) | ORAL | Status: DC
  Administered 2019-12-19 (×2): 300 mg via ORAL
  Filled 2019-12-18 (×2): qty 1

## 2019-12-18 MED ORDER — CEFAZOLIN SODIUM-DEXTROSE 1-4 GM/50ML-% IV SOLN
1.0000 g | Freq: Two times a day (BID) | INTRAVENOUS | Status: DC
  Administered 2019-12-19: 1 g via INTRAVENOUS
  Filled 2019-12-18 (×3): qty 50

## 2019-12-18 MED ORDER — PANTOPRAZOLE SODIUM 40 MG PO TBEC
40.0000 mg | DELAYED_RELEASE_TABLET | Freq: Every day | ORAL | Status: DC
  Administered 2019-12-19: 40 mg via ORAL
  Filled 2019-12-18: qty 1

## 2019-12-18 MED ORDER — ATORVASTATIN CALCIUM 10 MG PO TABS
10.0000 mg | ORAL_TABLET | Freq: Every day | ORAL | Status: DC
  Administered 2019-12-19: 10 mg via ORAL
  Filled 2019-12-18: qty 1

## 2019-12-18 MED ORDER — AMLODIPINE BESYLATE 5 MG PO TABS
5.0000 mg | ORAL_TABLET | Freq: Every day | ORAL | Status: DC
  Administered 2019-12-19: 5 mg via ORAL
  Filled 2019-12-18: qty 1

## 2019-12-18 MED ORDER — SODIUM CHLORIDE 0.9% FLUSH: 3.0000 mL | Freq: Once | INTRAVENOUS | Status: DC

## 2019-12-18 MED ORDER — DOCUSATE SODIUM 100 MG PO CAPS
100.0000 mg | ORAL_CAPSULE | Freq: Two times a day (BID) | ORAL | Status: DC
  Administered 2019-12-19 (×2): 100 mg via ORAL
  Filled 2019-12-18 (×2): qty 1

## 2019-12-18 MED ORDER — HYDROCODONE-ACETAMINOPHEN 5-325 MG PO TABS: 1.0000 | ORAL_TABLET | ORAL | Status: DC | PRN

## 2019-12-18 MED ORDER — ENSURE ENLIVE PO LIQD: 237.0000 mL | Freq: Two times a day (BID) | ORAL | Status: DC

## 2019-12-18 MED ORDER — METOPROLOL TARTRATE 25 MG PO TABS
25.0000 mg | ORAL_TABLET | Freq: Two times a day (BID) | ORAL | Status: DC
  Administered 2019-12-19 (×2): 25 mg via ORAL
  Filled 2019-12-18 (×2): qty 1

## 2019-12-18 MED ORDER — TIZANIDINE HCL 4 MG PO TABS
4.0000 mg | ORAL_TABLET | Freq: Every day | ORAL | Status: DC
  Filled 2019-12-18 (×2): qty 1

## 2019-12-18 MED ORDER — SERTRALINE HCL 50 MG PO TABS
25.0000 mg | ORAL_TABLET | Freq: Every day | ORAL | Status: DC
  Administered 2019-12-19: 25 mg via ORAL
  Filled 2019-12-18: qty 1

## 2019-12-18 MED ORDER — ACETAMINOPHEN 650 MG RE SUPP: 650.0000 mg | Freq: Four times a day (QID) | RECTAL | Status: DC | PRN

## 2019-12-18 MED ORDER — SENNA 8.6 MG PO TABS: 1.0000 | ORAL_TABLET | Freq: Every day | ORAL | Status: DC | PRN

## 2019-12-18 MED ORDER — ONDANSETRON HCL 4 MG/2ML IJ SOLN
4.0000 mg | Freq: Four times a day (QID) | INTRAMUSCULAR | Status: DC | PRN
  Administered 2019-12-19: 4 mg via INTRAVENOUS
  Filled 2019-12-18: qty 2

## 2019-12-18 NOTE — ED Triage Notes (Signed)
Sent to ED for evaluation of right leg swelling.  Patient states she had stiches pulled from BKA incision and then leg started swelling.  Reviewing notes from Vascular, the concern is a possible abscess to right leg at surgical site.  Patient is AAOx3. Skin warm and dry. NAD

## 2019-12-18 NOTE — ED Notes (Signed)
Son to come to ED to pick patient up.  Patient notified.

## 2019-12-18 NOTE — H&P (View-Only) (Signed)
Subjective:    Patient ID: [name], female    DOB: [birth year], 86 y.o.   MRN: [id number] Chief Complaint  Patient presents with  . Wound Check    1 wk staple removal    Patient presents today for staple removal from left above-knee amputation.  The patient originally had a Band-Aid near the lateral portion.  Once this Band-Aid was removed it was revealed to have copious purulent drainage coming from the area.  There was also areas of infection in the remaining staples.  Upon removal of the remaining staples on her stump the medial portion also had purulent drainage.  The patient's family reports that her stop swallow abruptly overnight and that swelling was not present before.  The patient reports stopping sore and tender.  She did not tolerate stent removal well.  However she denies any fever, chills, nausea, vomiting or diarrhea.   Review of Systems  Cardiovascular: Positive for leg swelling.  Skin: Positive for wound.  All other systems reviewed and are negative.      Objective:   Physical Exam Vitals reviewed.  HENT:     Head: Normocephalic.  Cardiovascular:     Rate and Rhythm: Normal rate and regular rhythm.     Pulses: Normal pulses.  Pulmonary:     Effort: Pulmonary effort is normal.     Breath sounds: Normal breath sounds.  Musculoskeletal:     Right Lower Extremity: Right leg is amputated above knee.  Feet:     Right foot:     Skin integrity: Skin breakdown present.  Skin:    General: Skin is warm and dry.     Findings: Abscess present.  Neurological:     Mental Status: She is alert and oriented to person, place, and time.     Motor: Weakness present.     Gait: Gait abnormal.  Psychiatric:        Mood and Affect: Mood normal.        Behavior: Behavior normal.        Thought Content: Thought content normal.        Judgment: Judgment normal.     BP (!) 149/64 (BP Location: Right Arm)   Pulse (!) 59   Resp 14   Ht 5\' 6"  (1.676 m)   Wt 105 lb (47.6  kg)   BMI 16.95 kg/m   Past Medical History:  Diagnosis Date  . Arthritis   . GERD (gastroesophageal reflux disease)   . Hypertension   . Neck fracture ([place]) 2015   C2    Social History   Socioeconomic History  . Marital status: Single    Spouse name: Not on file  . Number of children: Not on file  . Years of education: Not on file  . Highest education level: Not on file  Occupational History  . Not on file  Tobacco Use  . Smoking status: Never Smoker  . Smokeless tobacco: Never Used  Substance and Sexual Activity  . Alcohol use: No    Alcohol/week: 0.0 standard drinks  . Drug use: No  . Sexual activity: Not on file  Other Topics Concern  . Not on file  Social History Narrative  . Not on file   Social Determinants of Health   Financial Resource Strain:   . Difficulty of Paying Living Expenses:   Food Insecurity:   . Worried About Charity fundraiser in the Last Year:   . [place] in the  Last Year:   Transportation Needs:   . Film/video editor (Medical):   Marland Kitchen Lack of Transportation (Non-Medical):   Physical Activity:   . Days of Exercise per Week:   . Minutes of Exercise per Session:   Stress:   . Feeling of Stress :   Social Connections:   . Frequency of Communication with Friends and Family:   . Frequency of Social Gatherings with Friends and Family:   . Attends Religious Services:   . Active Member of Clubs or Organizations:   . Attends Archivist Meetings:   Marland Kitchen Marital Status:   Intimate Partner Violence:   . Fear of Current or Ex-Partner:   . Emotionally Abused:   Marland Kitchen Physically Abused:   . Sexually Abused:     Past Surgical History:  Procedure Laterality Date  . ABDOMINAL HYSTERECTOMY    . AMPUTATION Right 11/06/2019   Procedure: AMPUTATION ABOVE KNEE;  Surgeon: Algernon Huxley, MD;  Location: [facility];  Service: General;  Laterality: Right;  . CHOLECYSTECTOMY  08/06/14  . ESOPHAGOGASTRODUODENOSCOPY (EGD) WITH PROPOFOL N/A  08/03/2016   Procedure: ESOPHAGOGASTRODUODENOSCOPY (EGD) WITH PROPOFOL;  Surgeon: Ronald Lobo, MD;  Location: [facility];  Service: Gastroenterology;  Laterality: N/A;  . EYE SURGERY  2015   cataract  . LOWER EXTREMITY ANGIOGRAPHY Right 10/20/2019   Procedure: LOWER EXTREMITY ANGIOGRAPHY;  Surgeon: Algernon Huxley, MD;  Location: [place] [facility];  Service: Cardiovascular;  Laterality: Right;    Family History  Problem Relation Age of Onset  . Rheumatologic disease Mother     No Known Allergies     Assessment & Plan:   1. Postoperative wound infection The patient's wound is very concerning for an underlying abscess.  The original cause of the patient's amputation was due to gangrene.  Given the patient's advanced age, this infection could become septic quickly.  The patient was sent to the emergency room for evaluation and possible intervention.  2. Essential hypertension Continue antihypertensive medications as already ordered, these medications have been reviewed and there are no changes at this time.    Current Outpatient Medications on File Prior to Visit  Medication Sig Dispense Refill  . allopurinol (ZYLOPRIM) 300 MG tablet Take 300 mg by mouth daily.    Marland Kitchen amLODipine (NORVASC) 5 MG tablet Take 1 tablet (5 mg total) by mouth daily. 30 tablet 11  . aspirin EC 81 MG tablet Take 1 tablet (81 mg total) by mouth daily. 150 tablet 2  . atorvastatin (LIPITOR) 10 MG tablet Take 1 tablet (10 mg total) by mouth daily. 30 tablet 11  . clopidogrel (PLAVIX) 75 MG tablet Take 1 tablet (75 mg total) by mouth daily. 30 tablet 11  . COLCRYS 0.6 MG tablet Take 1 tablet by mouth daily as needed.     . feeding supplement, ENSURE ENLIVE, (ENSURE ENLIVE) LIQD Take 237 mLs by mouth 2 (two) times daily between meals. 237 mL 12  . gabapentin (NEURONTIN) 300 MG capsule Take 300 mg by mouth 3 (three) times daily.    . hydrALAZINE (APRESOLINE) 50 MG tablet Take 50 mg by mouth 2 (two) times daily.       . hyoscyamine (LEVSIN SL) 0.125 MG SL tablet Place 0.125 mg under the tongue every 4 (four) hours as needed.    . metoprolol tartrate (LOPRESSOR) 25 MG tablet Take 25 mg by mouth 2 (two) times daily.    . pantoprazole (PROTONIX) 40 MG tablet Pantoprazole Sodium 40 MG Oral Tablet Delayed  Release QTY: 30 each Days: 30 Refills: 2  Written: 07/05/18 Patient Instructions: TAKE 1 TABLET BY MOUTH DAILY    . POLY-IRON 150 FORTE 150-25-1 MG-MCG-MG CAPS Take 1 capsule by mouth every morning.     . SENNA LAXATIVE 8.6 MG tablet Take 1 tablet by mouth daily as needed.     . sertraline (ZOLOFT) 25 MG tablet Take 25 mg by mouth daily with breakfast.     . tiZANidine (ZANAFLEX) 2 MG tablet Take 4 mg by mouth at bedtime.      No current facility-administered medications on file prior to visit.    There are no Patient Instructions on file for this visit. No follow-ups on file.   Kris Hartmann, NP

## 2019-12-18 NOTE — Progress Notes (Signed)
Subjective:    Patient ID: [name], female    DOB: [birth year], 86 y.o.   MRN: [id number] Chief Complaint  Patient presents with  . Wound Check    1 wk staple removal    Patient presents today for staple removal from left above-knee amputation.  The patient originally had a Band-Aid near the lateral portion.  Once this Band-Aid was removed it was revealed to have copious purulent drainage coming from the area.  There was also areas of infection in the remaining staples.  Upon removal of the remaining staples on her stump the medial portion also had purulent drainage.  The patient's family reports that her stop swallow abruptly overnight and that swelling was not present before.  The patient reports stopping sore and tender.  She did not tolerate stent removal well.  However she denies any fever, chills, nausea, vomiting or diarrhea.   Review of Systems  Cardiovascular: Positive for leg swelling.  Skin: Positive for wound.  All other systems reviewed and are negative.      Objective:   Physical Exam Vitals reviewed.  HENT:     Head: Normocephalic.  Cardiovascular:     Rate and Rhythm: Normal rate and regular rhythm.     Pulses: Normal pulses.  Pulmonary:     Effort: Pulmonary effort is normal.     Breath sounds: Normal breath sounds.  Musculoskeletal:     Right Lower Extremity: Right leg is amputated above knee.  Feet:     Right foot:     Skin integrity: Skin breakdown present.  Skin:    General: Skin is warm and dry.     Findings: Abscess present.  Neurological:     Mental Status: She is alert and oriented to person, place, and time.     Motor: Weakness present.     Gait: Gait abnormal.  Psychiatric:        Mood and Affect: Mood normal.        Behavior: Behavior normal.        Thought Content: Thought content normal.        Judgment: Judgment normal.     BP (!) 149/64 (BP Location: Right Arm)   Pulse (!) 59   Resp 14   Ht 5\' 6"  (1.676 m)   Wt 105 lb (47.6  kg)   BMI 16.95 kg/m   Past Medical History:  Diagnosis Date  . Arthritis   . GERD (gastroesophageal reflux disease)   . Hypertension   . Neck fracture ([place]) 2015   C2    Social History   Socioeconomic History  . Marital status: Single    Spouse name: Not on file  . Number of children: Not on file  . Years of education: Not on file  . Highest education level: Not on file  Occupational History  . Not on file  Tobacco Use  . Smoking status: Never Smoker  . Smokeless tobacco: Never Used  Substance and Sexual Activity  . Alcohol use: No    Alcohol/week: 0.0 standard drinks  . Drug use: No  . Sexual activity: Not on file  Other Topics Concern  . Not on file  Social History Narrative  . Not on file   Social Determinants of Health   Financial Resource Strain:   . Difficulty of Paying Living Expenses:   Food Insecurity:   . Worried About Charity fundraiser in the Last Year:   . [place] in the  Last Year:   Transportation Needs:   . Film/video editor (Medical):   Marland Kitchen Lack of Transportation (Non-Medical):   Physical Activity:   . Days of Exercise per Week:   . Minutes of Exercise per Session:   Stress:   . Feeling of Stress :   Social Connections:   . Frequency of Communication with Friends and Family:   . Frequency of Social Gatherings with Friends and Family:   . Attends Religious Services:   . Active Member of Clubs or Organizations:   . Attends Archivist Meetings:   Marland Kitchen Marital Status:   Intimate Partner Violence:   . Fear of Current or Ex-Partner:   . Emotionally Abused:   Marland Kitchen Physically Abused:   . Sexually Abused:     Past Surgical History:  Procedure Laterality Date  . ABDOMINAL HYSTERECTOMY    . AMPUTATION Right 11/06/2019   Procedure: AMPUTATION ABOVE KNEE;  Surgeon: Algernon Huxley, MD;  Location: [facility];  Service: General;  Laterality: Right;  . CHOLECYSTECTOMY  08/06/14  . ESOPHAGOGASTRODUODENOSCOPY (EGD) WITH PROPOFOL N/A  08/03/2016   Procedure: ESOPHAGOGASTRODUODENOSCOPY (EGD) WITH PROPOFOL;  Surgeon: Ronald Lobo, MD;  Location: [facility];  Service: Gastroenterology;  Laterality: N/A;  . EYE SURGERY  2015   cataract  . LOWER EXTREMITY ANGIOGRAPHY Right 10/20/2019   Procedure: LOWER EXTREMITY ANGIOGRAPHY;  Surgeon: Algernon Huxley, MD;  Location: [facility];  Service: Cardiovascular;  Laterality: Right;    Family History  Problem Relation Age of Onset  . Rheumatologic disease Mother     No Known Allergies     Assessment & Plan:   1. Postoperative wound infection The patient's wound is very concerning for an underlying abscess.  The original cause of the patient's amputation was due to gangrene.  Given the patient's advanced age, this infection could become septic quickly.  The patient was sent to the emergency room for evaluation and possible intervention.  2. Essential hypertension Continue antihypertensive medications as already ordered, these medications have been reviewed and there are no changes at this time.    Current Outpatient Medications on File Prior to Visit  Medication Sig Dispense Refill  . allopurinol (ZYLOPRIM) 300 MG tablet Take 300 mg by mouth daily.    Marland Kitchen amLODipine (NORVASC) 5 MG tablet Take 1 tablet (5 mg total) by mouth daily. 30 tablet 11  . aspirin EC 81 MG tablet Take 1 tablet (81 mg total) by mouth daily. 150 tablet 2  . atorvastatin (LIPITOR) 10 MG tablet Take 1 tablet (10 mg total) by mouth daily. 30 tablet 11  . clopidogrel (PLAVIX) 75 MG tablet Take 1 tablet (75 mg total) by mouth daily. 30 tablet 11  . COLCRYS 0.6 MG tablet Take 1 tablet by mouth daily as needed.     . feeding supplement, ENSURE ENLIVE, (ENSURE ENLIVE) LIQD Take 237 mLs by mouth 2 (two) times daily between meals. 237 mL 12  . gabapentin (NEURONTIN) 300 MG capsule Take 300 mg by mouth 3 (three) times daily.    . hydrALAZINE (APRESOLINE) 50 MG tablet Take 50 mg by mouth 2 (two) times daily.       . hyoscyamine (LEVSIN SL) 0.125 MG SL tablet Place 0.125 mg under the tongue every 4 (four) hours as needed.    . metoprolol tartrate (LOPRESSOR) 25 MG tablet Take 25 mg by mouth 2 (two) times daily.    . pantoprazole (PROTONIX) 40 MG tablet Pantoprazole Sodium 40 MG Oral Tablet Delayed  Release QTY: 30 each Days: 30 Refills: 2  Written: 07/05/18 Patient Instructions: TAKE 1 TABLET BY MOUTH DAILY    . POLY-IRON 150 FORTE 150-25-1 MG-MCG-MG CAPS Take 1 capsule by mouth every morning.     . SENNA LAXATIVE 8.6 MG tablet Take 1 tablet by mouth daily as needed.     . sertraline (ZOLOFT) 25 MG tablet Take 25 mg by mouth daily with breakfast.     . tiZANidine (ZANAFLEX) 2 MG tablet Take 4 mg by mouth at bedtime.      No current facility-administered medications on file prior to visit.    There are no Patient Instructions on file for this visit. No follow-ups on file.   Kris Hartmann, NP

## 2019-12-18 NOTE — ED Notes (Signed)
Two unsuccessful attempts to establish IV access. Dr Archie Balboa notified. IV team consult placed.

## 2019-12-18 NOTE — ED Provider Notes (Signed)
[facility] Emergency Department Provider Note   ____________________________________________   I have reviewed the triage vital signs and the nursing notes.   HISTORY  Chief Complaint Leg Swelling   History limited by: Not Limited   HPI [name] is a 86 y.o. female who presents to the emergency department today from vascular surgery clinic because of concerns for postop wound infection.  The patient underwent a right above-the-knee amputation roughly a month and a half ago.  She was at clinic today for follow-up and staple removal.  Apparently when they removed the staples "copious" amounts of pus came from the wound.  They also noticed new swelling to the wound that was not there previously.The patient denies any fever.  Records reviewed. Per medical record review patient has a history of aka June 25th with Dr. Lucky Cowboy. Seen in clinic today and sent to the  Past Medical History:  Diagnosis Date  . Arthritis   . GERD (gastroesophageal reflux disease)   . Hypertension   . Neck fracture ([place]) 2015   C2    Patient Active Problem List   Diagnosis Date Noted  . Gangrene of right foot ([place]) 11/05/2019  . Gangrene due to peripheral vascular disease ([place]) 11/05/2019  . Atherosclerosis of native arteries of the extremities with ulceration ([place]) 10/17/2019  . Bedbug bite 04/22/2019  . Hypotension 04/22/2019  . Gout 04/22/2019  . AKI (acute kidney injury) ([place]) 04/22/2019  . Cognitive impairment 04/22/2019  . Acute anemia 04/21/2019  . HTN (hypertension) 08/02/2016  . UGIB (upper gastrointestinal bleed) 08/02/2016  . Acute blood loss anemia 08/02/2016  . Climacteric arthritis, lower leg 03/22/2016  . Depression 03/22/2016  . Esophageal reflux 03/22/2016  . Osteoarthritis of knee 09/07/2015  . Closed fracture of second cervical vertebra ([place]) 09/01/2015  . Abdominal pain 03/17/2015  . CKD (chronic kidney disease) stage 4, GFR 15-29 ml/min ([facility])  04/28/2014    Past Surgical History:  Procedure Laterality Date  . ABDOMINAL HYSTERECTOMY    . AMPUTATION Right 11/06/2019   Procedure: AMPUTATION ABOVE KNEE;  Surgeon: Algernon Huxley, MD;  Location: [facility];  Service: General;  Laterality: Right;  . CHOLECYSTECTOMY  08/06/14  . ESOPHAGOGASTRODUODENOSCOPY (EGD) WITH PROPOFOL N/A 08/03/2016   Procedure: ESOPHAGOGASTRODUODENOSCOPY (EGD) WITH PROPOFOL;  Surgeon: Ronald Lobo, MD;  Location: [facility];  Service: Gastroenterology;  Laterality: N/A;  . EYE SURGERY  2015   cataract  . LOWER EXTREMITY ANGIOGRAPHY Right 10/20/2019   Procedure: LOWER EXTREMITY ANGIOGRAPHY;  Surgeon: Algernon Huxley, MD;  Location: [place] [facility];  Service: Cardiovascular;  Laterality: Right;    Prior to Admission medications   Medication Sig Start Date End Date Taking? Authorizing Provider  allopurinol (ZYLOPRIM) 300 MG tablet Take 300 mg by mouth daily.    [provider]  amLODipine (NORVASC) 5 MG tablet Take 1 tablet (5 mg total) by mouth daily. 04/26/19 04/25/20  Kathie Dike, MD  aspirin EC 81 MG tablet Take 1 tablet (81 mg total) by mouth daily. 10/20/19   Algernon Huxley, MD  atorvastatin (LIPITOR) 10 MG tablet Take 1 tablet (10 mg total) by mouth daily. 10/20/19 10/19/20  Algernon Huxley, MD  clopidogrel (PLAVIX) 75 MG tablet Take 1 tablet (75 mg total) by mouth daily. 10/20/19   Algernon Huxley, MD  COLCRYS 0.6 MG tablet Take 1 tablet by mouth daily as needed.  04/14/19   [provider]  feeding supplement, ENSURE ENLIVE, (ENSURE ENLIVE) LIQD Take 237 mLs  by mouth 2 (two) times daily between meals. 11/11/19   Enzo Bi, MD  gabapentin (NEURONTIN) 300 MG capsule Take 300 mg by mouth 3 (three) times daily. 10/10/19   [provider]  hydrALAZINE (APRESOLINE) 50 MG tablet Take 50 mg by mouth 2 (two) times daily.     [provider]  hyoscyamine (LEVSIN SL) 0.125 MG SL tablet Place 0.125 mg under the tongue every 4 (four) hours as  needed. 11/03/19   [provider]  metoprolol tartrate (LOPRESSOR) 25 MG tablet Take 25 mg by mouth 2 (two) times daily. 07/11/19   [provider]  pantoprazole (PROTONIX) 40 MG tablet Pantoprazole Sodium 40 MG Oral Tablet Delayed Release QTY: 30 each Days: 30 Refills: 2  Written: 07/05/18 Patient Instructions: TAKE 1 TABLET BY MOUTH DAILY 07/05/18   [provider]  POLY-IRON 150 FORTE 150-25-1 MG-MCG-MG CAPS Take 1 capsule by mouth every morning.  03/28/19   [provider]  SENNA LAXATIVE 8.6 MG tablet Take 1 tablet by mouth daily as needed.  11/05/19   [provider]  sertraline (ZOLOFT) 25 MG tablet Take 25 mg by mouth daily with breakfast.     [provider]  tiZANidine (ZANAFLEX) 2 MG tablet Take 4 mg by mouth at bedtime.     [provider]    Allergies Patient has no known allergies.  Family History  Problem Relation Age of Onset  . Rheumatologic disease Mother     Social History Social History   Tobacco Use  . Smoking status: Never Smoker  . Smokeless tobacco: Never Used  Substance Use Topics  . Alcohol use: No    Alcohol/week: 0.0 standard drinks  . Drug use: No    Review of Systems Constitutional: No fever/chills Eyes: No visual changes. ENT: No sore throat. Cardiovascular: Denies chest pain. Respiratory: Denies shortness of breath. Gastrointestinal: No abdominal pain.  No nausea, no vomiting.  No diarrhea.   Genitourinary: Negative for dysuria. Musculoskeletal: Positive for pain to site of right aka.  Skin: Negative for rash. Neurological: Negative for headaches, focal weakness or numbness.  ____________________________________________   PHYSICAL EXAM:  VITAL SIGNS: ED Triage Vitals  Enc Vitals Group     BP 12/18/19 1356 (!) 152/59     Pulse Rate 12/18/19 1356 66     Resp 12/18/19 1356 16     Temp 12/18/19 1356 98.6 F (37 C)     Temp Source 12/18/19 1356 Oral     SpO2 12/18/19 1356 99 %      Weight 12/18/19 1350 104 lb 15 oz (47.6 kg)     Height 12/18/19 1350 5\' 6"  (1.676 m)     Head Circumference --      Peak Flow --      Pain Score 12/18/19 1350 3     Pain Loc --      Pain Edu? --      Excl. in [place]? --      Constitutional: Alert and oriented.  Eyes: Conjunctivae are normal.  ENT      Head: Normocephalic and atraumatic.      Nose: No congestion/rhinnorhea.      Mouth/Throat: Mucous membranes are moist.      Neck: No stridor. Hematological/Lymphatic/Immunilogical: No cervical lymphadenopathy. Cardiovascular: Normal rate, regular rhythm.  No murmurs, rubs, or gallops.  Respiratory: Normal respiratory effort without tachypnea nor retractions. Breath sounds are clear and equal bilaterally. No wheezes/rales/rhonchi. Gastrointestinal: Soft and non tender. No rebound. No guarding.  Genitourinary: Deferred Musculoskeletal: Right aka wound appears intact. No surrounding erythema appreciated. No pus elicited.  Neurologic:  Normal speech and language. No gross focal neurologic deficits are appreciated.  Skin:  Skin is warm, dry and intact. No rash noted. Psychiatric: Mood and affect are normal. Speech and behavior are normal. Patient exhibits appropriate insight and judgment.  ____________________________________________    LABS (pertinent positives/negatives)  Lactic 1.3 CBC wbc 12.5, hgb 11.2, plt 381 CMP na 137, k 4.9, glu 93, cr 1.16  ____________________________________________   EKG  None  ____________________________________________    RADIOLOGY  Right femur Post operative changes. Some osseous changes either relating to post operative status vs infection. Soft tissue fullness without gas.  ____________________________________________   PROCEDURES  Procedures  ____________________________________________   INITIAL IMPRESSION / ASSESSMENT AND PLAN / ED COURSE  Pertinent labs & imaging results that were available during my care of the  patient were reviewed by me and considered in my medical decision making (see chart for details).   Patient presented to the emergency department today from vascular surgery clinic because of concerns for postop site infection.  Per their note they did have a large pus discharge from staple removal.  On exam here no obvious surrounding erythema.  No pus was elicited.  Patient had a mild leukocytosis.  Discussed with Dr. Delana Meyer who is on-call for vascular surgery.  He will plan to admit.  ____________________________________________   FINAL CLINICAL IMPRESSION(S) / ED DIAGNOSES  Final diagnoses:  Postoperative infection, unspecified type, initial encounter     Note: This dictation was prepared with Dragon dictation. Any transcriptional errors that result from this process are unintentional     Nance Pear, MD 12/18/19 2021

## 2019-12-19 DIAGNOSIS — Z89511 Acquired absence of right leg below knee: Secondary | ICD-10-CM | POA: Diagnosis not present

## 2019-12-19 DIAGNOSIS — T8140XA Infection following a procedure, unspecified, initial encounter: Secondary | ICD-10-CM | POA: Diagnosis not present

## 2019-12-19 LAB — CBC
HCT: 31.1 % — ABNORMAL LOW (ref 36.0–46.0)
Hemoglobin: 10.7 g/dL — ABNORMAL LOW (ref 12.0–15.0)
MCH: 27.8 pg (ref 26.0–34.0)
MCHC: 34.4 g/dL (ref 30.0–36.0)
MCV: 80.8 fL (ref 80.0–100.0)
Platelets: 349 10*3/uL (ref 150–400)
RBC: 3.85 MIL/uL — ABNORMAL LOW (ref 3.87–5.11)
RDW: 15.9 % — ABNORMAL HIGH (ref 11.5–15.5)
WBC: 11 10*3/uL — ABNORMAL HIGH (ref 4.0–10.5)
nRBC: 0 % (ref 0.0–0.2)

## 2019-12-19 LAB — BASIC METABOLIC PANEL
Anion gap: 7 (ref 5–15)
BUN: 27 mg/dL — ABNORMAL HIGH (ref 8–23)
CO2: 27 mmol/L (ref 22–32)
Calcium: 9 mg/dL (ref 8.9–10.3)
Chloride: 106 mmol/L (ref 98–111)
Creatinine, Ser: 1.2 mg/dL — ABNORMAL HIGH (ref 0.44–1.00)
GFR calc Af Amer: 44 mL/min — ABNORMAL LOW (ref >60)
GFR calc non Af Amer: 38 mL/min — ABNORMAL LOW (ref >60)
Glucose, Bld: 104 mg/dL — ABNORMAL HIGH (ref 70–99)
Potassium: 5.4 mmol/L — ABNORMAL HIGH (ref 3.5–5.1)
Sodium: 140 mmol/L (ref 135–145)

## 2019-12-19 LAB — POTASSIUM: Potassium: 5.1 mmol/L (ref 3.5–5.1)

## 2019-12-19 MED ORDER — CEPHALEXIN 500 MG PO CAPS: 500.0000 mg | ORAL_CAPSULE | Freq: Four times a day (QID) | ORAL | 0 refills | Status: AC

## 2019-12-19 NOTE — TOC Progression Note (Signed)
Transition of Care [facility]) - Progression Note    Patient Details  Name: [name] MRN: [id number] Date of Birth: 02-05-1921  [facility] CM/SW Contact  Su Hilt, RN Phone Number: 12/19/2019, 4:17 PM  Clinical Narrative:   [facility] to pick up the patient at 530, Bedside nurse aware         Expected Discharge Plan and Services           Expected Discharge Date: 12/19/19                                     Social Determinants of Health (SDOH) Interventions    Readmission Risk Interventions Readmission Risk Prevention Plan 04/24/2019  Medication Screening Complete  Transportation Screening Complete  Some recent data might be hidden

## 2019-12-19 NOTE — Plan of Care (Signed)
  Problem: Clinical Measurements: Goal: Will remain free from infection Outcome: Not Progressing   

## 2019-12-19 NOTE — Discharge Summary (Signed)
[facility]    Discharge Summary  Patient ID:  [name] MRN: [id number] DOB/AGE: [birth year] 86 y.o.  Admit date: 12/18/2019 Discharge date: 12/19/2019 Date of Surgery: * No surgery found * Surgeon: * Surgery not found *  Admission Diagnosis: Abscess of leg, right [L02.415] S/P BKA (below knee amputation) unilateral, right ([place]) [Z89.511] Postoperative infection, unspecified type, initial encounter [T81.40XA]  Discharge Diagnoses:  Abscess of leg, right [L02.415] S/P BKA (below knee amputation) unilateral, right ([facility]) [Z89.511] Postoperative infection, unspecified type, initial encounter [T81.40XA]  Secondary Diagnoses: Past Medical History:  Diagnosis Date   Arthritis    GERD (gastroesophageal reflux disease)    Hypertension    Neck fracture ([place]) 2015   C2   Discharged Condition: Good  HPI / Hospital Course:  The patient is 86 year old female with multiple issues who is status post a right above-the-knee amputation due to right foot gangrene.  Patient presented to our clinic 2 weeks ago to have her first set of staples removed without issue.  Presented yesterday for the remaining sutures removed, nurse practitioner felt patient had abscess to the stump and recommended further work-up in the emergency department.  Patient was admitted received IV antibiotics for observation.  Seen and examined by Dr. Delana Meyer.  There was no evidence of any erythema, purulent discharge, tenderness or signs of infection to the stump.  Most likely a suture / staple abscess.  Patient was discharged the following afternoon with oral antibiotics and a follow-up in our clinic in one week.  Physical exam: Alert and oriented x3, no acute distress Cardiovascular: Regular rate and rhythm Pulmonary: Clear to auscultation bilaterally Abdomen: Soft, nontender, nondistended Right lower extremity:  Thigh soft.  Stump is healthy.  All sutures/staples have been removed.   Steri-Strips are intact.  There is no erythema or tenderness to the stump.  Skin looks healthy.  Unable to express any discharge.   Labs: As below  Complications: None  Consults: None  Significant Diagnostic Studies: CBC Lab Results  Component Value Date   WBC 11.0 (H) 12/19/2019   HGB 10.7 (L) 12/19/2019   HCT 31.1 (L) 12/19/2019   MCV 80.8 12/19/2019   PLT 349 12/19/2019   BMET    Component Value Date/Time   NA 140 12/19/2019 0541   NA 146 (H) 08/03/2014 1006   NA 140 06/13/2014 1943   K 5.1 12/19/2019 1426   K 4.0 06/13/2014 1943   CL 106 12/19/2019 0541   CL 101 06/13/2014 1943   CO2 27 12/19/2019 0541   CO2 33 (H) 06/13/2014 1943   GLUCOSE 104 (H) 12/19/2019 0541   GLUCOSE 99 06/13/2014 1943   BUN 27 (H) 12/19/2019 0541   BUN 8 (L) 08/03/2014 1006   BUN 14 06/13/2014 1943   CREATININE 1.20 (H) 12/19/2019 0541   CREATININE 1.10 06/13/2014 1943   CALCIUM 9.0 12/19/2019 0541   CALCIUM 9.0 06/13/2014 1943   GFRNONAA 38 (L) 12/19/2019 0541   GFRNONAA 49 (L) 06/13/2014 1943   GFRAA 44 (L) 12/19/2019 0541   GFRAA 60 (L) 06/13/2014 1943   COAG Lab Results  Component Value Date   INR 1.2 11/06/2019   INR 1.2 11/05/2019   INR 1.07 08/01/2016   Disposition:  Discharge to :Home  Allergies as of 12/19/2019   No Known Allergies     Medication List    TAKE these medications   allopurinol 300 MG tablet Commonly known as: ZYLOPRIM Take 300 mg by mouth daily.  amLODipine 5 MG tablet Commonly known as: NORVASC Take 1 tablet (5 mg total) by mouth daily.   aspirin EC 81 MG tablet Take 1 tablet (81 mg total) by mouth daily.   atorvastatin 10 MG tablet Commonly known as: Lipitor Take 1 tablet (10 mg total) by mouth daily.   cephALEXin 500 MG capsule Commonly known as: Keflex Take 1 capsule (500 mg total) by mouth 4 (four) times daily for 7 days.   clopidogrel 75 MG tablet Commonly known as: Plavix Take 1 tablet (75 mg total) by mouth daily.   Colcrys  0.6 MG tablet Generic drug: colchicine Take 1 tablet by mouth daily as needed.   feeding supplement (ENSURE ENLIVE) Liqd Take 237 mLs by mouth 2 (two) times daily between meals.   gabapentin 300 MG capsule Commonly known as: NEURONTIN Take 300 mg by mouth 3 (three) times daily.   hydrALAZINE 50 MG tablet Commonly known as: APRESOLINE Take 50 mg by mouth 2 (two) times daily.   hyoscyamine 0.125 MG SL tablet Commonly known as: LEVSIN SL Place 0.125 mg under the tongue every 4 (four) hours as needed.   metoprolol tartrate 25 MG tablet Commonly known as: LOPRESSOR Take 25 mg by mouth 2 (two) times daily.   pantoprazole 40 MG tablet Commonly known as: PROTONIX Take 40 mg by mouth daily.   Poly-Iron 150 Forte 150-0.025-1 MG Caps Generic drug: Iron Polysacch Cmplx-B12-FA Take 1 capsule by mouth every morning.   Senna Laxative 8.6 MG tablet Generic drug: senna Take 1 tablet by mouth daily as needed.   sertraline 25 MG tablet Commonly known as: ZOLOFT Take 25 mg by mouth daily with breakfast.   tiZANidine 2 MG tablet Commonly known as: ZANAFLEX Take 4 mg by mouth at bedtime.      Verbal and written Discharge instructions given to the patient. Wound care per Discharge AVS  Follow-up Information    Dew, Erskine Squibb, MD Follow up in 1 week(s).   Specialties: Vascular Surgery, Radiology, Interventional Cardiology Why: Can see Dew or Arna Medici. No studies needed. Stump check.  Contact information: [place] [address] [phone]              Signed: Sela Hua, PA-C  12/19/2019, 3:22 PM

## 2019-12-19 NOTE — Discharge Planning (Addendum)
Attempted to contact transport service [phone] (given to me per patient son) - to transport home.  Transport service indicated that they would be unable to transport today, since their schedule if already full.  Left Message on son's cell to inform and seek another form of transport.  Waiting on return call.

## 2019-12-19 NOTE — Plan of Care (Signed)
Doctors informed and aware of potassium lab result of 5.1.  No further treatments needed at this time.  Verbal ok for discharge.

## 2019-12-26 ENCOUNTER — Other Ambulatory Visit: Payer: Self-pay

## 2019-12-26 ENCOUNTER — Ambulatory Visit (INDEPENDENT_AMBULATORY_CARE_PROVIDER_SITE_OTHER): Payer: Medicare Other | Admitting: Nurse Practitioner

## 2019-12-26 ENCOUNTER — Encounter (INDEPENDENT_AMBULATORY_CARE_PROVIDER_SITE_OTHER): Payer: Self-pay | Admitting: Nurse Practitioner

## 2019-12-26 VITALS — BP 135/59 | HR 61 | Ht 66.0 in

## 2019-12-26 DIAGNOSIS — L02415 Cutaneous abscess of right lower limb: Secondary | ICD-10-CM | POA: Diagnosis not present

## 2019-12-26 DIAGNOSIS — S78111A Complete traumatic amputation at level between right hip and knee, initial encounter: Secondary | ICD-10-CM | POA: Diagnosis not present

## 2019-12-26 DIAGNOSIS — I10 Essential (primary) hypertension: Secondary | ICD-10-CM

## 2019-12-30 ENCOUNTER — Encounter (INDEPENDENT_AMBULATORY_CARE_PROVIDER_SITE_OTHER): Payer: Self-pay | Admitting: Nurse Practitioner

## 2019-12-30 NOTE — Progress Notes (Signed)
Subjective:    Patient ID: [name], female    DOB: 02-Feb-1921, 86 y.o.   MRN: [id number] Chief Complaint  Patient presents with  . Follow-up    1 week [facility] post Abcess of RLE  Stump check    The patient presents today following hospitalization for abscess of her right lower extremity.  The patient presented a week ago for further staple removal and upon removing several staples a large amount of purulent drainage exited from the lateral portion of her wound.  In the process of trying to remove the staples the stump had to be held firmly due to the patient fighting somewhat.  During this time the extensive amount of drainage occurred.  After presenting to the hospital no drainage was seen at that point.  The patient denies ever having any fever, chills, nausea, vomiting or diarrhea.  She denies any chest pain or shortness of breath.  The wound is mostly healed except for the lateral portion.   Review of Systems  Skin: Positive for wound.  Neurological: Positive for weakness.  All other systems reviewed and are negative.      Objective:   Physical Exam Vitals reviewed.  HENT:     Head: Normocephalic.  Cardiovascular:     Rate and Rhythm: Normal rate and regular rhythm.     Pulses: Normal pulses.  Pulmonary:     Effort: Pulmonary effort is normal.  Musculoskeletal:     Right Lower Extremity: Right leg is amputated below knee.  Neurological:     Mental Status: She is alert and oriented to person, place, and time.     Motor: Weakness present.     Gait: Gait abnormal.  Psychiatric:        Mood and Affect: Mood normal.        Behavior: Behavior normal.        Thought Content: Thought content normal.        Judgment: Judgment normal.     BP (!) 135/59   Pulse 61   Ht 5\' 6"  (1.676 m)   BMI 16.55 kg/m   Past Medical History:  Diagnosis Date  . Arthritis   . GERD (gastroesophageal reflux disease)   . Hypertension   . Neck fracture ([place]) 2015   C2    Social  History   Socioeconomic History  . Marital status: Single    Spouse name: Not on file  . Number of children: Not on file  . Years of education: Not on file  . Highest education level: Not on file  Occupational History  . Not on file  Tobacco Use  . Smoking status: Never Smoker  . Smokeless tobacco: Never Used  Substance and Sexual Activity  . Alcohol use: No    Alcohol/week: 0.0 standard drinks  . Drug use: No  . Sexual activity: Not on file  Other Topics Concern  . Not on file  Social History Narrative  . Not on file   Social Determinants of Health   Financial Resource Strain:   . Difficulty of Paying Living Expenses:   Food Insecurity:   . Worried About Charity fundraiser in the Last Year:   . Arboriculturist in the Last Year:   Transportation Needs:   . Film/video editor (Medical):   Marland Kitchen Lack of Transportation (Non-Medical):   Physical Activity:   . Days of Exercise per Week:   . Minutes of Exercise per Session:   Stress:   .  Feeling of Stress :   Social Connections:   . Frequency of Communication with Friends and Family:   . Frequency of Social Gatherings with Friends and Family:   . Attends Religious Services:   . Active Member of Clubs or Organizations:   . Attends Archivist Meetings:   Marland Kitchen Marital Status:   Intimate Partner Violence:   . Fear of Current or Ex-Partner:   . Emotionally Abused:   Marland Kitchen Physically Abused:   . Sexually Abused:     Past Surgical History:  Procedure Laterality Date  . ABDOMINAL HYSTERECTOMY    . AMPUTATION Right 11/06/2019   Procedure: AMPUTATION ABOVE KNEE;  Surgeon: Algernon Huxley, MD;  Location: [facility];  Service: General;  Laterality: Right;  . CHOLECYSTECTOMY  08/06/14  . ESOPHAGOGASTRODUODENOSCOPY (EGD) WITH PROPOFOL N/A 08/03/2016   Procedure: ESOPHAGOGASTRODUODENOSCOPY (EGD) WITH PROPOFOL;  Surgeon: Ronald Lobo, MD;  Location: [facility];  Service: Gastroenterology;  Laterality: N/A;  . EYE SURGERY  2015     cataract  . LOWER EXTREMITY ANGIOGRAPHY Right 10/20/2019   Procedure: LOWER EXTREMITY ANGIOGRAPHY;  Surgeon: Algernon Huxley, MD;  Location: [facility];  Service: Cardiovascular;  Laterality: Right;    Family History  Problem Relation Age of Onset  . Rheumatologic disease Mother     No Known Allergies     Assessment & Plan:   1. Abscess of leg, right Appears to have resolved at this time.  Patient did receive antibiotics her hospitalization.  No signs symptoms of infection or abscesses today.  There is a small open wound.  The lateral portion is a small open area.  The patient's nursing facility is advised to place Aquacel to the area. 2. Above knee amputation of right lower extremity ([place]) I discussed with the patient family regarding prosthetic.  The patient does have an advanced age and I discussed the process for prosthetic in addition to physical therapy requirements, as well as the increased difficulty walking with an above-knee prosthetic versus a below-knee prosthetic.  Despite this, the patient would like to try to proceed with prosthetic.  We will place referral for the patient.  We will also have the patient return in 6 weeks for ABIs also to determine that the mass completely healed.  3. Essential hypertension Continue antihypertensive medications as already ordered, these medications have been reviewed and there are no changes at this time.    Current Outpatient Medications on File Prior to Visit  Medication Sig Dispense Refill  . allopurinol (ZYLOPRIM) 300 MG tablet Take 300 mg by mouth daily.    Marland Kitchen amLODipine (NORVASC) 5 MG tablet Take 1 tablet (5 mg total) by mouth daily. 30 tablet 11  . aspirin EC 81 MG tablet Take 1 tablet (81 mg total) by mouth daily. 150 tablet 2  . atorvastatin (LIPITOR) 10 MG tablet Take 1 tablet (10 mg total) by mouth daily. 30 tablet 11  . clopidogrel (PLAVIX) 75 MG tablet Take 1 tablet (75 mg total) by mouth daily. 30 tablet 11  .  COLCRYS 0.6 MG tablet Take 1 tablet by mouth daily as needed.     . gabapentin (NEURONTIN) 300 MG capsule Take 300 mg by mouth 3 (three) times daily.    . hydrALAZINE (APRESOLINE) 50 MG tablet Take 50 mg by mouth 2 (two) times daily.     . hyoscyamine (LEVSIN SL) 0.125 MG SL tablet Place 0.125 mg under the tongue every 4 (four) hours as needed.    Marland Kitchen  metoprolol tartrate (LOPRESSOR) 25 MG tablet Take 25 mg by mouth 2 (two) times daily.    . pantoprazole (PROTONIX) 40 MG tablet Take 40 mg by mouth daily.     Marland Kitchen POLY-IRON 150 FORTE 150-25-1 MG-MCG-MG CAPS Take 1 capsule by mouth every morning.     . sertraline (ZOLOFT) 25 MG tablet Take 25 mg by mouth daily with breakfast.     . tiZANidine (ZANAFLEX) 2 MG tablet Take 4 mg by mouth at bedtime.     . feeding supplement, ENSURE ENLIVE, (ENSURE ENLIVE) LIQD Take 237 mLs by mouth 2 (two) times daily between meals. (Patient not taking: Reported on 12/26/2019) 237 mL 12  . SENNA LAXATIVE 8.6 MG tablet Take 1 tablet by mouth daily as needed.  (Patient not taking: Reported on 12/26/2019)     No current facility-administered medications on file prior to visit.    There are no Patient Instructions on file for this visit. No follow-ups on file.   Kris Hartmann, NP

## 2020-01-13 NOTE — Interval H&P Note (Signed)
History and Physical Interval Note:  01/13/2020 11:51 AM  [name]  has presented today for surgery, with the diagnosis of * No surgery found *.  The various methods of treatment have been discussed with the patient and family. After consideration of risks, benefits and other options for treatment, the patient has consented to  * No surgery found * as a surgical intervention.  The patient's history has been reviewed, patient examined, no change in status, stable for surgery.  I have reviewed the patient's chart and labs.  Questions were answered to the patient's satisfaction.     Leotis Pain

## 2020-01-13 NOTE — H&P (Signed)
See office note from 12/5019

## 2020-01-15 ENCOUNTER — Telehealth (INDEPENDENT_AMBULATORY_CARE_PROVIDER_SITE_OTHER): Payer: Self-pay

## 2020-01-15 NOTE — Telephone Encounter (Signed)
I returned the pt contact [name] call and left a voice mail for her to return the call to the office so I can get  More information on the pt per the voicemail left the pt has bed sore that are not healing an [name] would like for her to be seen sooner than her scheduled appointment on the 97 th.

## 2020-01-15 NOTE — Telephone Encounter (Signed)
Could you please call [name] and schedule the pts appointment sooner  Per the NP   She has a Black ulcer on her heel.

## 2020-01-15 NOTE — Telephone Encounter (Signed)
If she has a bed sore on her buttocks, we do not treat those.  We treat vascular disease and in the case of a bed sore, the patient would need to be seen by the wound clinic.  She should follow with her PCP to get a referral to the wound clinic

## 2020-01-16 NOTE — Telephone Encounter (Signed)
I called the pts daughter and she made me aware that the pts bed sore was on her heel not on her buttocks I made the NP aware  And she said that it was ok to go a head an have the pt scheduled to be seen soon that he already scheduled appt

## 2020-02-05 ENCOUNTER — Other Ambulatory Visit (INDEPENDENT_AMBULATORY_CARE_PROVIDER_SITE_OTHER): Payer: Self-pay | Admitting: Nurse Practitioner

## 2020-02-05 DIAGNOSIS — Z95820 Peripheral vascular angioplasty status with implants and grafts: Secondary | ICD-10-CM

## 2020-02-05 DIAGNOSIS — I739 Peripheral vascular disease, unspecified: Secondary | ICD-10-CM

## 2020-02-05 DIAGNOSIS — S78111A Complete traumatic amputation at level between right hip and knee, initial encounter: Secondary | ICD-10-CM

## 2020-02-06 ENCOUNTER — Encounter (INDEPENDENT_AMBULATORY_CARE_PROVIDER_SITE_OTHER): Payer: Self-pay | Admitting: Nurse Practitioner

## 2020-02-06 ENCOUNTER — Other Ambulatory Visit: Payer: Self-pay

## 2020-02-06 ENCOUNTER — Ambulatory Visit (INDEPENDENT_AMBULATORY_CARE_PROVIDER_SITE_OTHER): Payer: Medicare Other

## 2020-02-06 ENCOUNTER — Ambulatory Visit (INDEPENDENT_AMBULATORY_CARE_PROVIDER_SITE_OTHER): Payer: Medicare Other | Admitting: Nurse Practitioner

## 2020-02-06 VITALS — BP 152/67 | HR 60 | Ht 66.0 in

## 2020-02-06 DIAGNOSIS — I739 Peripheral vascular disease, unspecified: Secondary | ICD-10-CM

## 2020-02-06 DIAGNOSIS — E11621 Type 2 diabetes mellitus with foot ulcer: Secondary | ICD-10-CM

## 2020-02-06 DIAGNOSIS — L97429 Non-pressure chronic ulcer of left heel and midfoot with unspecified severity: Secondary | ICD-10-CM | POA: Diagnosis not present

## 2020-02-06 DIAGNOSIS — S78111A Complete traumatic amputation at level between right hip and knee, initial encounter: Secondary | ICD-10-CM

## 2020-02-06 DIAGNOSIS — I10 Essential (primary) hypertension: Secondary | ICD-10-CM

## 2020-02-06 DIAGNOSIS — Z95820 Peripheral vascular angioplasty status with implants and grafts: Secondary | ICD-10-CM | POA: Diagnosis not present

## 2020-02-06 DIAGNOSIS — I7025 Atherosclerosis of native arteries of other extremities with ulceration: Secondary | ICD-10-CM | POA: Diagnosis not present

## 2020-02-10 ENCOUNTER — Encounter (INDEPENDENT_AMBULATORY_CARE_PROVIDER_SITE_OTHER): Payer: Self-pay | Admitting: Nurse Practitioner

## 2020-02-10 NOTE — Progress Notes (Signed)
Subjective:    Patient ID: [name], female    DOB: August 01, 1920, 86 y.o.   MRN: [id number] Chief Complaint  Patient presents with  . Follow-up    U/S follow up    Patient presents today for evaluation of noninvasive studies due to ulceration on her left heel.  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  The ulceration present on the patient's heel started small as a pressure injury and has grown.  The patient has been within a nursing facility and they have attempted to elevate her heels however unsuccessfully.  Patient denies any extensive pain.  There is no evidence of infection of the wounds that she has no pain, drainage, redness or heat associated with the area.  The whole area is covered in eschar.  The patient's right stump is healing well at this time.  Today noninvasive studies reveal an ABI that is noncompressible on the left.  He has a TBI of 0.41.  There are monophasic waveforms within the left lower extremity with slightly dampened toe waveforms.   Review of Systems  Skin: Positive for wound.  Neurological: Positive for weakness.  All other systems reviewed and are negative.      Objective:   Physical Exam Vitals reviewed.  HENT:     Head: Normocephalic.  Cardiovascular:     Rate and Rhythm: Normal rate.  Pulmonary:     Effort: Pulmonary effort is normal.  Musculoskeletal:     Right Lower Extremity: Right leg is amputated below knee.  Neurological:     Mental Status: She is alert and oriented to person, place, and time.     Motor: Weakness present.     Gait: Gait abnormal.  Psychiatric:        Mood and Affect: Mood normal.        Behavior: Behavior normal.        Thought Content: Thought content normal.        Judgment: Judgment normal.     BP (!) 152/67   Pulse 60   Ht 5\' 6"  (1.676 m)   BMI 16.55 kg/m   Past Medical History:  Diagnosis Date  . Arthritis   . GERD (gastroesophageal reflux  disease)   . Hypertension   . Neck fracture ([place]) 2015   C2    Social History   Socioeconomic History  . Marital status: Single    Spouse name: Not on file  . Number of children: Not on file  . Years of education: Not on file  . Highest education level: Not on file  Occupational History  . Not on file  Tobacco Use  . Smoking status: Never Smoker  . Smokeless tobacco: Never Used  Substance and Sexual Activity  . Alcohol use: No    Alcohol/week: 0.0 standard drinks  . Drug use: No  . Sexual activity: Not on file  Other Topics Concern  . Not on file  Social History Narrative  . Not on file   Social Determinants of Health   Financial Resource Strain:   . Difficulty of Paying Living Expenses: Not on file  Food Insecurity:   . Worried About Charity fundraiser in the Last Year: Not on file  . Ran Out of Food in the Last Year: Not on file  Transportation Needs:   . Lack of Transportation (Medical): Not on file  . Lack of Transportation (Non-Medical): Not on file  Physical Activity:   .  Days of Exercise per Week: Not on file  . Minutes of Exercise per Session: Not on file  Stress:   . Feeling of Stress : Not on file  Social Connections:   . Frequency of Communication with Friends and Family: Not on file  . Frequency of Social Gatherings with Friends and Family: Not on file  . Attends Religious Services: Not on file  . Active Member of Clubs or Organizations: Not on file  . Attends Archivist Meetings: Not on file  . Marital Status: Not on file  Intimate Partner Violence:   . Fear of Current or Ex-Partner: Not on file  . Emotionally Abused: Not on file  . Physically Abused: Not on file  . Sexually Abused: Not on file    Past Surgical History:  Procedure Laterality Date  . ABDOMINAL HYSTERECTOMY    . AMPUTATION Right 11/06/2019   Procedure: AMPUTATION ABOVE KNEE;  Surgeon: Algernon Huxley, MD;  Location: [facility];  Service: General;  Laterality: Right;  .  CHOLECYSTECTOMY  08/06/14  . ESOPHAGOGASTRODUODENOSCOPY (EGD) WITH PROPOFOL N/A 08/03/2016   Procedure: ESOPHAGOGASTRODUODENOSCOPY (EGD) WITH PROPOFOL;  Surgeon: Ronald Lobo, MD;  Location: [facility];  Service: Gastroenterology;  Laterality: N/A;  . EYE SURGERY  2015   cataract  . LOWER EXTREMITY ANGIOGRAPHY Right 10/20/2019   Procedure: LOWER EXTREMITY ANGIOGRAPHY;  Surgeon: Algernon Huxley, MD;  Location: [facility];  Service: Cardiovascular;  Laterality: Right;    Family History  Problem Relation Age of Onset  . Rheumatologic disease Mother     No Known Allergies     Assessment & Plan:   1. Diabetic ulcer of left heel associated with type 2 diabetes mellitus, unspecified ulcer stage ([place]) We will refer the patient back to the foot center as she has previously had care there.  We will send for evaluation and treatment of the left heel ulceration.  The patient is also advised to try to keep pressure off of that heel by floating her heel when in bed. - Ambulatory referral to Podiatry  2. Atherosclerosis of native arteries of the extremities with ulceration ([place]) Based on noninvasive studies today the patient should have marginal ability for wound healing.  We will continue to follow closely for wound evaluation.  We will plan to have the patient back in 6 weeks to repeat noninvasive studies as well as to reevaluate progression with wound.  Patient should be seen sooner if following debridement it is seen that the patient's wound is showing no signs of healing.  3. Essential hypertension Continue antihypertensive medications as already ordered, these medications have been reviewed and there are no changes at this time.    Current Outpatient Medications on File Prior to Visit  Medication Sig Dispense Refill  . allopurinol (ZYLOPRIM) 300 MG tablet Take 300 mg by mouth daily.    Marland Kitchen amLODipine (NORVASC) 5 MG tablet Take 1 tablet (5 mg total) by mouth daily. 30 tablet 11  .  aspirin EC 81 MG tablet Take 1 tablet (81 mg total) by mouth daily. 150 tablet 2  . atorvastatin (LIPITOR) 10 MG tablet Take 1 tablet (10 mg total) by mouth daily. 30 tablet 11  . clopidogrel (PLAVIX) 75 MG tablet Take 1 tablet (75 mg total) by mouth daily. 30 tablet 11  . COLCRYS 0.6 MG tablet Take 1 tablet by mouth daily as needed.     . feeding supplement, ENSURE ENLIVE, (ENSURE ENLIVE) LIQD Take 237 mLs by mouth 2 (  two) times daily between meals. 237 mL 12  . gabapentin (NEURONTIN) 300 MG capsule Take 300 mg by mouth 3 (three) times daily.    . hydrALAZINE (APRESOLINE) 50 MG tablet Take 50 mg by mouth 2 (two) times daily.     . hyoscyamine (LEVSIN SL) 0.125 MG SL tablet Place 0.125 mg under the tongue every 4 (four) hours as needed.    . metoprolol tartrate (LOPRESSOR) 25 MG tablet Take 25 mg by mouth 2 (two) times daily.    . pantoprazole (PROTONIX) 40 MG tablet Take 40 mg by mouth daily.     Marland Kitchen POLY-IRON 150 FORTE 150-25-1 MG-MCG-MG CAPS Take 1 capsule by mouth every morning.     . SENNA LAXATIVE 8.6 MG tablet Take 1 tablet by mouth daily as needed.     . sertraline (ZOLOFT) 25 MG tablet Take 25 mg by mouth daily with breakfast.     . tiZANidine (ZANAFLEX) 2 MG tablet Take 4 mg by mouth at bedtime.      No current facility-administered medications on file prior to visit.    There are no Patient Instructions on file for this visit. No follow-ups on file.   Kris Hartmann, NP

## 2020-02-12 ENCOUNTER — Encounter: Payer: Self-pay | Admitting: Podiatry

## 2020-02-12 ENCOUNTER — Other Ambulatory Visit: Payer: Self-pay

## 2020-02-12 ENCOUNTER — Ambulatory Visit (INDEPENDENT_AMBULATORY_CARE_PROVIDER_SITE_OTHER): Payer: Medicare Other | Admitting: Podiatry

## 2020-02-12 DIAGNOSIS — L89620 Pressure ulcer of left heel, unstageable: Secondary | ICD-10-CM | POA: Diagnosis not present

## 2020-02-12 DIAGNOSIS — R234 Changes in skin texture: Secondary | ICD-10-CM

## 2020-02-12 MED ORDER — OXYCODONE-ACETAMINOPHEN 10-325 MG PO TABS: 1.0000 | ORAL_TABLET | Freq: Four times a day (QID) | ORAL | 0 refills | Status: AC | PRN
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
## 2020-02-20 ENCOUNTER — Telehealth: Payer: Self-pay | Admitting: *Deleted

## 2020-02-20 NOTE — Telephone Encounter (Signed)
Nurse from [facility] states wound is not looking good and needs to know how to proceed, change of protocol or reappoint to check?!? Please advise.

## 2020-02-23 NOTE — Telephone Encounter (Signed)
Can you get this patient scheduled with Posey Pronto?

## 2020-02-23 NOTE — Telephone Encounter (Signed)
I'm returning your call to schedule an appointment for [name].  "I'm the home health nurse.  You need to call her daughter or her son.  I'm so glad you called.  I tried to get Hospice arranged for her but they said she didn't qualify."  I am calling to schedule your mother an appointment with Dr. Posey Pronto.  "Call my brother."  I am calling to schedule an appointment for your mother for tomorrow.  "We can't do tomorrow.  She has several appointments scheduled for tomorrow.  Can you bring her in on Thursday?  "Yes, what time?"  Can you bring her in at  3:45 pm?  "That will be fine."

## 2020-02-23 NOTE — Telephone Encounter (Signed)
She can make appointment to see me this week or go to emergency room if there are signs of infections present

## 2020-02-26 ENCOUNTER — Ambulatory Visit: Payer: Medicare Other | Admitting: Podiatry

## 2020-02-26 ENCOUNTER — Other Ambulatory Visit: Payer: Self-pay

## 2020-02-26 MED ORDER — SANTYL 250 UNIT/GM EX OINT
1.0000 | TOPICAL_OINTMENT | Freq: Every day | CUTANEOUS | 1 refills | Status: AC
Start: 2020-02-26 — End: ?

## 2020-03-02 ENCOUNTER — Other Ambulatory Visit: Payer: Self-pay

## 2020-03-02 ENCOUNTER — Ambulatory Visit: Payer: Medicare Other | Admitting: Podiatry

## 2020-03-09 ENCOUNTER — Encounter: Payer: Self-pay | Admitting: Podiatry

## 2020-03-09 ENCOUNTER — Ambulatory Visit (INDEPENDENT_AMBULATORY_CARE_PROVIDER_SITE_OTHER): Payer: Medicare Other | Admitting: Podiatry

## 2020-03-09 ENCOUNTER — Other Ambulatory Visit: Payer: Self-pay

## 2020-03-09 DIAGNOSIS — L89620 Pressure ulcer of left heel, unstageable: Secondary | ICD-10-CM

## 2020-03-09 MED ORDER — SANTYL 250 UNIT/GM EX OINT
1.0000 | TOPICAL_OINTMENT | Freq: Every day | CUTANEOUS | 0 refills | Status: DC
Start: 2020-03-09 — End: 2020-03-11

## 2020-03-09 NOTE — Progress Notes (Signed)
Subjective:  Patient ID: [name], female    DOB: [birth year],  MRN: [id number]  Chief Complaint  Patient presents with  . Foot Ulcer    follow up ulcer left foot    86 y.o. female presents for wound care.  Patient is following up for left heel ulceration/eschar.  Patient states is doing little bit better.  She has no flow to the lower extremity.  Patient now has above-the-knee amputation to the right side.  She denies any other acute complaints.   Review of Systems: Negative except as noted in the HPI. Denies N/V/F/Ch.  Past Medical History:  Diagnosis Date  . Arthritis   . GERD (gastroesophageal reflux disease)   . Hypertension   . Neck fracture ([place]) 2015   C2    Current Outpatient Medications:  .  allopurinol (ZYLOPRIM) 300 MG tablet, Take 300 mg by mouth daily., Disp: , Rfl:  .  amLODipine (NORVASC) 5 MG tablet, Take 1 tablet (5 mg total) by mouth daily., Disp: 30 tablet, Rfl: 11 .  aspirin EC 81 MG tablet, Take 1 tablet (81 mg total) by mouth daily., Disp: 150 tablet, Rfl: 2 .  atorvastatin (LIPITOR) 10 MG tablet, Take 1 tablet (10 mg total) by mouth daily., Disp: 30 tablet, Rfl: 11 .  clopidogrel (PLAVIX) 75 MG tablet, Take 1 tablet (75 mg total) by mouth daily., Disp: 30 tablet, Rfl: 11 .  COLCRYS 0.6 MG tablet, Take 1 tablet by mouth daily as needed. , Disp: , Rfl:  .  collagenase (SANTYL) ointment, Apply 1 application topically daily., Disp: 30 g, Rfl: 1 .  collagenase (SANTYL) ointment, Apply 1 application topically daily., Disp: 30 g, Rfl: 0 .  feeding supplement, ENSURE ENLIVE, (ENSURE ENLIVE) LIQD, Take 237 mLs by mouth 2 (two) times daily between meals., Disp: 237 mL, Rfl: 12 .  gabapentin (NEURONTIN) 300 MG capsule, Take 300 mg by mouth 3 (three) times daily., Disp: , Rfl:  .  hydrALAZINE (APRESOLINE) 50 MG tablet, Take 50 mg by mouth 2 (two) times daily. , Disp: , Rfl:  .  hyoscyamine (LEVSIN SL) 0.125 MG SL tablet, Place 0.125 mg under the tongue every 4  (four) hours as needed., Disp: , Rfl:  .  metoprolol tartrate (LOPRESSOR) 25 MG tablet, Take 25 mg by mouth 2 (two) times daily., Disp: , Rfl:  .  pantoprazole (PROTONIX) 40 MG tablet, Take 40 mg by mouth daily. , Disp: , Rfl:  .  POLY-IRON 150 FORTE 150-25-1 MG-MCG-MG CAPS, Take 1 capsule by mouth every morning. , Disp: , Rfl:  .  SENNA LAXATIVE 8.6 MG tablet, Take 1 tablet by mouth daily as needed. , Disp: , Rfl:  .  sertraline (ZOLOFT) 25 MG tablet, Take 25 mg by mouth daily with breakfast. , Disp: , Rfl:   Social History   Tobacco Use  Smoking Status Never Smoker  Smokeless Tobacco Never Used    No Known Allergies Objective:  There were no vitals filed for this visit. There is no height or weight on file to calculate BMI. Constitutional Well developed. Well nourished.  Vascular Dorsalis pedis pulses palpable bilaterally. Posterior tibial pulses palpable bilaterally. Capillary refill normal to all digits.  No cyanosis or clubbing noted. Pedal hair growth normal.  Neurologic Normal speech. Oriented to person, place, and time. Protective sensation absent  Dermatologic Wound Location: Left heel pressure ulcer unstageable with hard fibrotic.  No clinical signs of infection noted more no malodor present no purulent drainage expressed Wound Base:  Necrotic eschar Peri-wound: Normal Exudate: Scant/small amount Serous exudate Wound Measurements: -See below  Right amputation of the leg noted.  Orthopedic: No pain to palpation either foot.   Radiographs: None Assessment:   1. Pressure ulcer of left heel, unstageable ([facility])    Plan:  Patient was evaluated and treated and all questions answered.  Ulcer left heel eschar unstageable depth With Hard Fibrotic cap -At this time we will hold off on any debridement as patient has stable eschar without any loosening noted. -Dressed with Santyl wet-to-dry dressing -Continue off-loading being nonweightbearing with a wheelchair. -I  discussed with the patient that at this time given that she has poor circulation to the left lower extremity due to new ABIs PVRs that were done I would just continue to do local wound care for now.  If this continues to get worse patient is a high risk of losing the leg to the left side as well.  I discussed with them in extensive detail. -No antibiotics are necessary at this time as is not clinically infected. -Aggressive offloading of the heel to allow for proper healing.  No follow-ups on file.

## 2020-03-10 ENCOUNTER — Telehealth: Payer: Self-pay

## 2020-03-10 NOTE — Telephone Encounter (Signed)
-----   Message from [place] sent at 03/10/2020  4:27 PM EDT ----- Regarding: Details for Script Sherri with [facility] would like a call back regarding the pts cream/ointment that was sent in. They would like to know the size of the wound as well as how long the tube should last the pt.   Call back number is [phone]

## 2020-03-10 NOTE — Telephone Encounter (Signed)
I spoke with Sherri, pharmacist, she said insurance is needing the measurement of the wounds and wanting to know how long a 30 gram tube will last the patient.  I also need to know how often the dressing is changed.  Please advise [facility] [phone]

## 2020-03-11 ENCOUNTER — Other Ambulatory Visit: Payer: Self-pay | Admitting: Podiatry

## 2020-03-11 NOTE — Telephone Encounter (Signed)
Please advise 

## 2020-03-11 NOTE — Telephone Encounter (Signed)
The wound measures 3 cm x 2 cm x unstageable depth. Its one a day change. If 30g is not enough you can do more. Thank you

## 2020-03-30 ENCOUNTER — Other Ambulatory Visit: Payer: Self-pay

## 2020-03-30 ENCOUNTER — Inpatient Hospital Stay
Admission: EM | Admit: 2020-03-30 | Discharge: 2020-04-02 | DRG: 592 | Disposition: A | Discharge status: Hospice | Payer: Medicare Other | Attending: Internal Medicine | Admitting: Internal Medicine

## 2020-03-30 ENCOUNTER — Emergency Department: Payer: Medicare Other

## 2020-03-30 ENCOUNTER — Telehealth (INDEPENDENT_AMBULATORY_CARE_PROVIDER_SITE_OTHER): Payer: Self-pay | Admitting: Vascular Surgery

## 2020-03-30 DIAGNOSIS — Z7982 Long term (current) use of aspirin: Secondary | ICD-10-CM

## 2020-03-30 DIAGNOSIS — Z515 Encounter for palliative care: Secondary | ICD-10-CM | POA: Diagnosis not present

## 2020-03-30 DIAGNOSIS — I129 Hypertensive chronic kidney disease with stage 1 through stage 4 chronic kidney disease, or unspecified chronic kidney disease: Secondary | ICD-10-CM | POA: Diagnosis present

## 2020-03-30 DIAGNOSIS — L97529 Non-pressure chronic ulcer of other part of left foot with unspecified severity: Secondary | ICD-10-CM | POA: Diagnosis not present

## 2020-03-30 DIAGNOSIS — Z79899 Other long term (current) drug therapy: Secondary | ICD-10-CM | POA: Diagnosis not present

## 2020-03-30 DIAGNOSIS — L89620 Pressure ulcer of left heel, unstageable: Secondary | ICD-10-CM | POA: Diagnosis present

## 2020-03-30 DIAGNOSIS — L03116 Cellulitis of left lower limb: Secondary | ICD-10-CM | POA: Diagnosis present

## 2020-03-30 DIAGNOSIS — I10 Essential (primary) hypertension: Secondary | ICD-10-CM | POA: Diagnosis not present

## 2020-03-30 DIAGNOSIS — Z7901 Long term (current) use of anticoagulants: Secondary | ICD-10-CM | POA: Diagnosis not present

## 2020-03-30 DIAGNOSIS — E43 Unspecified severe protein-calorie malnutrition: Secondary | ICD-10-CM | POA: Insufficient documentation

## 2020-03-30 DIAGNOSIS — R627 Adult failure to thrive: Secondary | ICD-10-CM | POA: Diagnosis present

## 2020-03-30 DIAGNOSIS — Z90710 Acquired absence of both cervix and uterus: Secondary | ICD-10-CM

## 2020-03-30 DIAGNOSIS — Z66 Do not resuscitate: Secondary | ICD-10-CM | POA: Diagnosis present

## 2020-03-30 DIAGNOSIS — R7881 Bacteremia: Secondary | ICD-10-CM | POA: Diagnosis present

## 2020-03-30 DIAGNOSIS — K219 Gastro-esophageal reflux disease without esophagitis: Secondary | ICD-10-CM | POA: Diagnosis present

## 2020-03-30 DIAGNOSIS — D631 Anemia in chronic kidney disease: Secondary | ICD-10-CM | POA: Diagnosis present

## 2020-03-30 DIAGNOSIS — Z7189 Other specified counseling: Secondary | ICD-10-CM | POA: Diagnosis not present

## 2020-03-30 DIAGNOSIS — I2510 Atherosclerotic heart disease of native coronary artery without angina pectoris: Secondary | ICD-10-CM | POA: Diagnosis present

## 2020-03-30 DIAGNOSIS — D649 Anemia, unspecified: Secondary | ICD-10-CM | POA: Diagnosis not present

## 2020-03-30 DIAGNOSIS — Z20822 Contact with and (suspected) exposure to covid-19: Secondary | ICD-10-CM | POA: Diagnosis present

## 2020-03-30 DIAGNOSIS — N184 Chronic kidney disease, stage 4 (severe): Secondary | ICD-10-CM | POA: Diagnosis present

## 2020-03-30 DIAGNOSIS — B9562 Methicillin resistant Staphylococcus aureus infection as the cause of diseases classified elsewhere: Secondary | ICD-10-CM | POA: Diagnosis not present

## 2020-03-30 DIAGNOSIS — Z89611 Acquired absence of right leg above knee: Secondary | ICD-10-CM

## 2020-03-30 DIAGNOSIS — I96 Gangrene, not elsewhere classified: Secondary | ICD-10-CM | POA: Diagnosis present

## 2020-03-30 DIAGNOSIS — F0390 Unspecified dementia without behavioral disturbance: Secondary | ICD-10-CM | POA: Diagnosis present

## 2020-03-30 DIAGNOSIS — L97528 Non-pressure chronic ulcer of other part of left foot with other specified severity: Secondary | ICD-10-CM | POA: Diagnosis not present

## 2020-03-30 DIAGNOSIS — M1990 Unspecified osteoarthritis, unspecified site: Secondary | ICD-10-CM | POA: Diagnosis present

## 2020-03-30 DIAGNOSIS — Z9049 Acquired absence of other specified parts of digestive tract: Secondary | ICD-10-CM

## 2020-03-30 DIAGNOSIS — N179 Acute kidney failure, unspecified: Secondary | ICD-10-CM | POA: Diagnosis present

## 2020-03-30 LAB — HEPATIC FUNCTION PANEL
ALT: 20 U/L (ref 0–44)
AST: 23 U/L (ref 15–41)
Albumin: 3.4 g/dL — ABNORMAL LOW (ref 3.5–5.0)
Alkaline Phosphatase: 69 U/L (ref 38–126)
Bilirubin, Direct: <0.1 mg/dL (ref 0.0–0.2)
Total Bilirubin: 0.5 mg/dL (ref 0.3–1.2)
Total Protein: 8.8 g/dL — ABNORMAL HIGH (ref 6.5–8.1)

## 2020-03-30 LAB — CBC
HCT: 33.9 % — ABNORMAL LOW (ref 36.0–46.0)
Hemoglobin: 10.8 g/dL — ABNORMAL LOW (ref 12.0–15.0)
MCH: 25.8 pg — ABNORMAL LOW (ref 26.0–34.0)
MCHC: 31.9 g/dL (ref 30.0–36.0)
MCV: 80.9 fL (ref 80.0–100.0)
Platelets: 332 10*3/uL (ref 150–400)
RBC: 4.19 MIL/uL (ref 3.87–5.11)
RDW: 15 % (ref 11.5–15.5)
WBC: 10.5 10*3/uL (ref 4.0–10.5)
nRBC: 0 % (ref 0.0–0.2)

## 2020-03-30 LAB — BASIC METABOLIC PANEL
Anion gap: 15 (ref 5–15)
BUN: 41 mg/dL — ABNORMAL HIGH (ref 8–23)
CO2: 25 mmol/L (ref 22–32)
Calcium: 9.5 mg/dL (ref 8.9–10.3)
Chloride: 104 mmol/L (ref 98–111)
Creatinine, Ser: 1.77 mg/dL — ABNORMAL HIGH (ref 0.44–1.00)
GFR, Estimated: 26 mL/min — ABNORMAL LOW (ref >60)
Glucose, Bld: 100 mg/dL — ABNORMAL HIGH (ref 70–99)
Potassium: 4.2 mmol/L (ref 3.5–5.1)
Sodium: 144 mmol/L (ref 135–145)

## 2020-03-30 LAB — RESPIRATORY PANEL BY RT PCR (FLU A&B, COVID)
Influenza A by PCR: NEGATIVE
Influenza B by PCR: NEGATIVE
SARS Coronavirus 2 by RT PCR: NEGATIVE

## 2020-03-30 LAB — CK: Total CK: 123 U/L (ref 38–234)

## 2020-03-30 LAB — C-REACTIVE PROTEIN: CRP: 3.2 mg/dL — ABNORMAL HIGH (ref <1.0)

## 2020-03-30 LAB — SEDIMENTATION RATE: Sed Rate: 65 mm/hr — ABNORMAL HIGH (ref 0–30)

## 2020-03-30 MED ORDER — VANCOMYCIN HCL 1250 MG/250ML IV SOLN
1250.0000 mg | Freq: Once | INTRAVENOUS | Status: AC
  Administered 2020-03-30: 1250 mg via INTRAVENOUS
  Filled 2020-03-30: qty 250

## 2020-03-30 MED ORDER — SODIUM CHLORIDE 0.9 % IV SOLN: 250.0000 mL | INTRAVENOUS | Status: DC | PRN

## 2020-03-30 MED ORDER — METOPROLOL TARTRATE 25 MG PO TABS
25.0000 mg | ORAL_TABLET | Freq: Two times a day (BID) | ORAL | Status: DC
  Administered 2020-03-30 – 2020-04-01 (×5): 25 mg via ORAL
  Filled 2020-03-30 (×6): qty 1

## 2020-03-30 MED ORDER — SODIUM CHLORIDE 0.9% FLUSH: 3.0000 mL | INTRAVENOUS | Status: DC | PRN

## 2020-03-30 MED ORDER — FENTANYL CITRATE (PF) 100 MCG/2ML IJ SOLN
50.0000 ug | Freq: Once | INTRAMUSCULAR | Status: AC
  Administered 2020-03-30: 50 ug via INTRAVENOUS
  Filled 2020-03-30: qty 2

## 2020-03-30 MED ORDER — HEPARIN SODIUM (PORCINE) 5000 UNIT/ML IJ SOLN
5000.0000 [IU] | Freq: Three times a day (TID) | INTRAMUSCULAR | Status: DC
  Administered 2020-03-30 – 2020-04-01 (×5): 5000 [IU] via SUBCUTANEOUS
  Filled 2020-03-30 (×5): qty 1

## 2020-03-30 MED ORDER — FENTANYL CITRATE (PF) 100 MCG/2ML IJ SOLN
25.0000 ug | Freq: Once | INTRAMUSCULAR | Status: AC
  Administered 2020-03-30: 25 ug via INTRAVENOUS
  Filled 2020-03-30: qty 2

## 2020-03-30 MED ORDER — GABAPENTIN 300 MG PO CAPS
300.0000 mg | ORAL_CAPSULE | Freq: Three times a day (TID) | ORAL | Status: DC
  Administered 2020-03-30 – 2020-04-01 (×8): 300 mg via ORAL
  Filled 2020-03-30 (×9): qty 1

## 2020-03-30 MED ORDER — OXYCODONE-ACETAMINOPHEN 5-325 MG PO TABS
1.0000 | ORAL_TABLET | Freq: Four times a day (QID) | ORAL | Status: DC | PRN
  Administered 2020-03-31 – 2020-04-01 (×6): 1 via ORAL
  Filled 2020-03-30 (×6): qty 1

## 2020-03-30 MED ORDER — AMLODIPINE BESYLATE 5 MG PO TABS
5.0000 mg | ORAL_TABLET | Freq: Every day | ORAL | Status: DC
  Administered 2020-03-30 – 2020-04-01 (×3): 5 mg via ORAL
  Filled 2020-03-30 (×4): qty 1

## 2020-03-30 MED ORDER — SODIUM CHLORIDE 0.9% FLUSH
3.0000 mL | Freq: Two times a day (BID) | INTRAVENOUS | Status: DC
  Administered 2020-03-30 – 2020-03-31 (×2): 3 mL via INTRAVENOUS

## 2020-03-30 MED ORDER — OXYCODONE-ACETAMINOPHEN 5-325 MG PO TABS
1.0000 | ORAL_TABLET | Freq: Once | ORAL | Status: AC
  Administered 2020-03-30: 1 via ORAL
  Filled 2020-03-30: qty 1

## 2020-03-30 MED ORDER — LACTATED RINGERS IV BOLUS
1000.0000 mL | Freq: Once | INTRAVENOUS | Status: AC
  Administered 2020-03-30: 1000 mL via INTRAVENOUS

## 2020-03-30 NOTE — Telephone Encounter (Signed)
I called and spoke to [name] and made her aware of the NP instructions.

## 2020-03-30 NOTE — Assessment & Plan Note (Signed)
Lab Results  Component Value Date   CREATININE 1.77 (H) 03/30/2020   CREATININE 1.20 (H) 12/19/2019   CREATININE 1.16 (H) 12/18/2019  AKI suspect due to dehydration. We will continue with gentle ivf hydration, avoid contrast studies and renally dose meds.

## 2020-03-30 NOTE — Assessment & Plan Note (Signed)
Blood pressure (!) 170/81, pulse 85, temperature 97.9 F (36.6 C), temperature source Oral, resp. rate 19, height 5' (1.524 m), weight 57.6 kg, SpO2 95 %. pt's home med regimen with metoprolol 25 mg and amlodipine  Continued.  PRN hydralazine continued.

## 2020-03-30 NOTE — Telephone Encounter (Signed)
If the patient has lost consciousness as well as hasn't eaten or had anything to drink in three days, she needs to be seen and evaluated in the emergency room, whether you call an ambulance or take her directly.  While we might be able to treat pain, there is a large concern for dehydration and other underlying issues.  They will likely evaluate the patient's circulation while she there and if it is a big enough concern, they will contact us.

## 2020-03-30 NOTE — ED Provider Notes (Signed)
[facility] Emergency Department Provider Note  ____________________________________________   First MD Initiated Contact with Patient 03/30/20 [phone]     (approximate)  I have reviewed the triage vital signs and the nursing notes.   HISTORY  Chief Complaint Failure To Thrive and Leg Pain   HPI [name] is a 86 y.o. female with past medical history of arthritis, GERD, HTN, and PVD status post right AKA who presents for assessment accompanied by son of persistent pain at the site of an ulcer on the left ankle over the last couple of days. Patient son states the patient has intermittent confusion at baseline but has been slightly more confused than usual last couple of days and seem to be in significant pain from her left ankle. He states he has been given her prescribed Vicodin but he feels this is not helping her pain. Patient is able to state she is in pain in her name and denies any other acute pain but is unable provide any further medical history. Per her son she has not had any recent falls or injuries, cough, fevers, vomiting, diarrhea, dysuria, or other acute sick symptoms.         Past Medical History:  Diagnosis Date  . Arthritis   . GERD (gastroesophageal reflux disease)   . Hypertension   . Neck fracture ([place]) 2015   C2    Patient Active Problem List   Diagnosis Date Noted  . Abscess of leg, right 12/18/2019  . Gangrene of right foot ([place]) 11/05/2019  . Gangrene due to peripheral vascular disease ([place]) 11/05/2019  . Atherosclerosis of native arteries of the extremities with ulceration ([place]) 10/17/2019  . Bedbug bite 04/22/2019  . Hypotension 04/22/2019  . Gout 04/22/2019  . AKI (acute kidney injury) ([place]) 04/22/2019  . Cognitive impairment 04/22/2019  . Acute anemia 04/21/2019  . HTN (hypertension) 08/02/2016  . UGIB (upper gastrointestinal bleed) 08/02/2016  . Acute blood loss anemia 08/02/2016  . Climacteric arthritis, lower leg  03/22/2016  . Depression 03/22/2016  . Esophageal reflux 03/22/2016  . Osteoarthritis of knee 09/07/2015  . Closed fracture of second cervical vertebra ([place]) 09/01/2015  . Abdominal pain 03/17/2015  . CKD (chronic kidney disease) stage 4, GFR 15-29 ml/min (HCC) 04/28/2014    Past Surgical History:  Procedure Laterality Date  . ABDOMINAL HYSTERECTOMY    . AMPUTATION Right 11/06/2019   Procedure: AMPUTATION ABOVE KNEE;  Surgeon: Algernon Huxley, MD;  Location: [facility] ORS;  Service: General;  Laterality: Right;  . CHOLECYSTECTOMY  08/06/14  . ESOPHAGOGASTRODUODENOSCOPY (EGD) WITH PROPOFOL N/A 08/03/2016   Procedure: ESOPHAGOGASTRODUODENOSCOPY (EGD) WITH PROPOFOL;  Surgeon: Ronald Lobo, MD;  Location: [facility];  Service: Gastroenterology;  Laterality: N/A;  . EYE SURGERY  2015   cataract  . LOWER EXTREMITY ANGIOGRAPHY Right 10/20/2019   Procedure: LOWER EXTREMITY ANGIOGRAPHY;  Surgeon: Algernon Huxley, MD;  Location: [facility];  Service: Cardiovascular;  Laterality: Right;    Prior to Admission medications   Medication Sig Start Date End Date Taking? Authorizing Provider  allopurinol (ZYLOPRIM) 300 MG tablet Take 300 mg by mouth daily.    [provider]  amLODipine (NORVASC) 5 MG tablet Take 1 tablet (5 mg total) by mouth daily. 04/26/19 04/25/20  Kathie Dike, MD  aspirin EC 81 MG tablet Take 1 tablet (81 mg total) by mouth daily. 10/20/19   Algernon Huxley, MD  atorvastatin (LIPITOR) 10 MG tablet Take 1 tablet (10 mg total) by mouth  daily. 10/20/19 10/19/20  Algernon Huxley, MD  clopidogrel (PLAVIX) 75 MG tablet Take 1 tablet (75 mg total) by mouth daily. 10/20/19   Algernon Huxley, MD  COLCRYS 0.6 MG tablet Take 1 tablet by mouth daily as needed.  04/14/19   [provider]  collagenase (SANTYL) ointment Apply 1 application topically daily. 02/26/20   [name], DPM  feeding supplement, ENSURE ENLIVE, (ENSURE ENLIVE) LIQD Take 237 mLs by mouth 2 (two) times daily between  meals. 11/11/19   Enzo Bi, MD  gabapentin (NEURONTIN) 300 MG capsule Take 300 mg by mouth 3 (three) times daily. 10/10/19   [provider]  hydrALAZINE (APRESOLINE) 50 MG tablet Take 50 mg by mouth 2 (two) times daily.     [provider]  hyoscyamine (LEVSIN SL) 0.125 MG SL tablet Place 0.125 mg under the tongue every 4 (four) hours as needed. 11/03/19   [provider]  metoprolol tartrate (LOPRESSOR) 25 MG tablet Take 25 mg by mouth 2 (two) times daily. 07/11/19   [provider]  pantoprazole (PROTONIX) 40 MG tablet Take 40 mg by mouth daily.  07/05/18   [provider]  POLY-IRON 150 FORTE 150-25-1 MG-MCG-MG CAPS Take 1 capsule by mouth every morning.  03/28/19   [provider]  SANTYL ointment APPLY ONE APPLICATION TOPICALLY DAILY AS DIRECTED **NEED WOUND SIZE/DAY SUPPLY INFO** 03/11/20   [name], DPM  SENNA LAXATIVE 8.6 MG tablet Take 1 tablet by mouth daily as needed.  11/05/19   [provider]  sertraline (ZOLOFT) 25 MG tablet Take 25 mg by mouth daily with breakfast.     [provider]    Allergies Patient has no known allergies.  Family History  Problem Relation Age of Onset  . Rheumatologic disease Mother     Social History Social History   Tobacco Use  . Smoking status: Never Smoker  . Smokeless tobacco: Never Used  Substance Use Topics  . Alcohol use: No    Alcohol/week: 0.0 standard drinks  . Drug use: No    Review of Systems  Review of Systems  Unable to perform ROS: Mental status change      ____________________________________________   PHYSICAL EXAM:  VITAL SIGNS: ED Triage Vitals  Enc Vitals Group     BP 03/30/20 1325 (!) 155/65     Pulse Rate 03/30/20 1325 85     Resp 03/30/20 1325 16     Temp 03/30/20 1325 97.9 F (36.6 C)     Temp Source 03/30/20 1325 Oral     SpO2 03/30/20 1325 99 %     Weight 03/30/20 1326 127 lb (57.6 kg)     Height 03/30/20 1326 5' (1.524 m)      Head Circumference --      Peak Flow --      Pain Score 03/30/20 1326 6     Pain Loc --      Pain Edu? --      Excl. in [place]? --    Vitals:   03/30/20 1325 03/30/20 1630  BP: (!) 155/65 (!) 175/85  Pulse: 85 86  Resp: 16 18  Temp: 97.9 F (36.6 C)   SpO2: 99% 99%   Physical Exam Vitals and nursing note reviewed.  Constitutional:      General: She is in acute distress.     Appearance: She is well-developed.  HENT:     Head: Normocephalic and atraumatic.     Right Ear:  External ear normal.     Left Ear: External ear normal.     Nose: Nose normal.     Mouth/Throat:     Mouth: Mucous membranes are dry.  Eyes:     Conjunctiva/sclera: Conjunctivae normal.  Cardiovascular:     Rate and Rhythm: Normal rate and regular rhythm.     Pulses: Normal pulses.     Heart sounds: No murmur heard.   Pulmonary:     Effort: Pulmonary effort is normal. No respiratory distress.     Breath sounds: Normal breath sounds.  Abdominal:     Palpations: Abdomen is soft.     Tenderness: There is no abdominal tenderness.  Musculoskeletal:     Cervical back: Neck supple.     Right Lower Extremity: Right leg is amputated above knee.  Skin:    General: Skin is warm and dry.  Neurological:     Mental Status: She is alert. She is confused.       There is some erythema warmth and tenderness surrounding the ulcer extending up past the left lateral malleolus. There is also some edema at the distal leg around the malleolus laterally and medially. 2+ DP pulse. ____________________________________________   LABS (all labs ordered are listed, but only abnormal results are displayed)  Labs Reviewed  BASIC METABOLIC PANEL - Abnormal; Notable for the following components:      Result Value   Glucose, Bld 100 (*)    BUN 41 (*)    Creatinine, Ser 1.77 (*)    GFR, Estimated 26 (*)    All other components within normal limits  CBC - Abnormal; Notable for the following components:   Hemoglobin 10.8  (*)    HCT 33.9 (*)    MCH 25.8 (*)    All other components within normal limits  HEPATIC FUNCTION PANEL - Abnormal; Notable for the following components:   Total Protein 8.8 (*)    Albumin 3.4 (*)    All other components within normal limits  SEDIMENTATION RATE - Abnormal; Notable for the following components:   Sed Rate 65 (*)    All other components within normal limits  RESPIRATORY PANEL BY RT PCR (FLU A&B, COVID)  CULTURE, BLOOD (ROUTINE X 2)  CULTURE, BLOOD (ROUTINE X 2)  CK  URINALYSIS, COMPLETE (UACMP) WITH MICROSCOPIC  C-REACTIVE PROTEIN   ____________________________________________   ____________________________________________  RADIOLOGY  ED MD interpretation: No fracture or dislocation but there is significant edema.  No large joint effusion.  Official radiology report(s): DG Ankle Complete Left  Result Date: 03/30/2020 CLINICAL DATA:  Left heel wound and swelling. EXAM: LEFT ANKLE COMPLETE - 3+ VIEW COMPARISON:  None. FINDINGS: Large soft tissue defect noted on the plantar aspect of the midfoot. No wound is identified over the calcaneus. There is diffuse soft tissue swelling/edema suggesting cellulitis. No definite destructive bony changes to suggest osteomyelitis. I do not see any findings suspicious for septic arthritis. Extensive vascular calcifications. IMPRESSION: 1. Large soft tissue defect on the plantar aspect of the midfoot. 2. Diffuse soft tissue swelling/edema suggesting cellulitis. 3. No definite plain film findings for septic arthritis or osteomyelitis. Electronically Signed   By: Marijo Sanes M.D.   On: 03/30/2020 17:10    ____________________________________________   PROCEDURES  Procedure(s) performed (including Critical Care):  Procedures   ____________________________________________   INITIAL IMPRESSION / ASSESSMENT AND PLAN / ED COURSE        Patient presents with left history exam accompanied by his son who assist in  providing  history as noted above for assessment of pain in the left ankle.  On arrival patient is hypertensive otherwise stable vital signs on room air.  She is little confused but denies any other acute pain.  Exam as noted above with some warmth, edema and tenderness about the eschar.  No large effusion noted on x-ray which does not show fracture dislocation.  Patient is neurovascular intact distally.  Impression is cellulitis which I feel is more likely at this time septic joint.  He did discuss patient's findings with on-call podiatrist Dr. Luana Shu who agreed with antibiotics and stated he would see the patient in the morning.    BMP remarkable for creatinine of 1.77 which represents an AKI when compared to creatinine of 1.2 pain 3 months ago.  No other significant electrolyte or metabolic derangements.  This is consistent with history from sound of patient having very poor p.o. intake I suspect is likely prerenal.  CBC shows no evidence of leukocytosis and chronic anemia with hemoglobin 8.8 compared to 8.73 months ago.  Platelets are unremarkable.  Hepatic function panel is unremarkable.  CK is unremarkable and not consistent with significant myositis.  ESR is elevated at 65.  I suspect patient is encephalopathic secondary to pain her left ankle is likely contributing factor of dehydration elevated BUN.  We will also plan to obtain urine to assess for any evidence of cystitis that could be contributing.  Encephalopathy.  Plan to admit to hospital service for further evaluation management.    ____________________________________________   FINAL CLINICAL IMPRESSION(S) / ED DIAGNOSES  Final diagnoses:  AKI (acute kidney injury) ([place])  Cellulitis of left lower extremity    Medications  amLODipine (NORVASC) tablet 5 mg (has no administration in time range)  gabapentin (NEURONTIN) capsule 300 mg (has no administration in time range)  metoprolol tartrate (LOPRESSOR) tablet 25 mg (has no administration in  time range)  lactated ringers bolus 1,000 mL (1,000 mLs Intravenous New Bag/Given 03/30/20 1705)  fentaNYL (SUBLIMAZE) injection 50 mcg (50 mcg Intravenous Given 03/30/20 1704)  oxyCODONE-acetaminophen (PERCOCET/ROXICET) 5-325 MG per tablet 1 tablet (1 tablet Oral Given 03/30/20 1650)     ED Discharge Orders    None       Note:  This document was prepared using Dragon voice recognition software and may include unintentional dictation errors.   Lucrezia Starch, MD 03/30/20 [phone]

## 2020-03-30 NOTE — Telephone Encounter (Signed)
Patient hasn't eaten or drank in 3 days

## 2020-03-30 NOTE — Assessment & Plan Note (Signed)
Images attached as below: [place]    03/30/2020 18:32  Attached To:  Hospital Encounter on 03/30/20  Source Information  Para Skeans, MD  [facility]   Media Information   Document Information  Photos    03/30/2020 18:32  Attached To:  Hospital Encounter on 03/30/20  Source Information  Para Skeans, MD  [facility]   Media Information   Document Information  Photos    03/30/2020 18:32  Attached To:  Hospital Encounter on 03/30/20  Source Information  Para Skeans, MD  [facility]   Media Information   Document Information  Photos    03/30/2020 18:31  Attached To:  Hospital Encounter on 03/30/20  Source Information  Para Skeans, MD  [facility]   Anti-infectives (From admission, onward)   Start     Dose/Rate Route Frequency Ordered Stop   03/30/20 1815  vancomycin (VANCOREADY) IVPB 1250 mg/250 mL        1,250 mg 166.7 mL/hr over 90 Minutes Intravenous  Once 03/30/20 1810       We will consult pharmacy for vancomycin dosing.  Podiatry consulted. Wound care consulted.

## 2020-03-30 NOTE — ED Triage Notes (Signed)
See first nurse note, pt states she eat some, denies any nausea or abd pain, pt states she is here because her legs hurt her off and on and are bothering her a lot lately, pt has AK amputation on right. No wounds noted at present. Pt is in NAD. Pt states she lives with her son.

## 2020-03-30 NOTE — ED Notes (Signed)
Pt sitting up in bed eating meal tray at this time. Pt denies needs at this time.

## 2020-03-30 NOTE — ED Triage Notes (Signed)
Pt in via EMS from home. EMS reports pts son and daughter in law called because pt has not eaten in 24 hours.  177/84, HR 75, 97% RA, temp 98.0. Pt with hx of HTN.  Pt denies pain or concerns to EMS.

## 2020-03-30 NOTE — Consult Note (Signed)
PHARMACY -  BRIEF ANTIBIOTIC NOTE   Pharmacy has received consult(s) for cellulitis from an ED provider.  The patient's profile has been reviewed for ht/wt/allergies/indication/available labs.    One time order(s) placed for vancomycin  Further antibiotics/pharmacy consults should be ordered by admitting physician if indicated.                       Thank you, [name] 03/30/2020  6:11 PM

## 2020-03-30 NOTE — H&P (Signed)
History and Physical    LASHAE WOLLENBERG DVV:[id number] DOB: 1921/03/02 DOA: 03/30/2020  PCP: Jodi Marble, MD    Patient coming from:  Home   Chief Complaint:  Left leg ulcer.   HPI: [name] is a 86 y.o. female with medical history significant of HTN, PVD S/P rt AKA , seen in ed for left foot ulcer. Pt was brought by son with c/o pain,pt is alert,awake cooperative but disoriented and thinks she is in [place]. HPI is per edmd note. Pt knows her name and dob but is otherwise confused.   ED Course:  Vitals:   03/30/20 1325 03/30/20 1326 03/30/20 1630  BP: (!) 155/65  (!) 175/85  Pulse: 85  86  Resp: 16  18  Temp: 97.9 F (36.6 C)    TempSrc: Oral    SpO2: 99%  99%  Weight:  57.6 kg   Height:  5' (1.524 m)   In ed pt received IV abx and edmd called dr Child psychotherapist podiatrist who is to see pt.   Review of Systems:  Review of Systems  Unable to perform ROS: Dementia   Past Medical History:  Diagnosis Date  . Arthritis   . GERD (gastroesophageal reflux disease)   . Hypertension   . Neck fracture ([place]) 2015   C2    Past Surgical History:  Procedure Laterality Date  . ABDOMINAL HYSTERECTOMY    . AMPUTATION Right 11/06/2019   Procedure: AMPUTATION ABOVE KNEE;  Surgeon: Algernon Huxley, MD;  Location: [facility] ORS;  Service: General;  Laterality: Right;  . CHOLECYSTECTOMY  08/06/14  . ESOPHAGOGASTRODUODENOSCOPY (EGD) WITH PROPOFOL N/A 08/03/2016   Procedure: ESOPHAGOGASTRODUODENOSCOPY (EGD) WITH PROPOFOL;  Surgeon: Ronald Lobo, MD;  Location: [facility];  Service: Gastroenterology;  Laterality: N/A;  . EYE SURGERY  2015   cataract  . LOWER EXTREMITY ANGIOGRAPHY Right 10/20/2019   Procedure: LOWER EXTREMITY ANGIOGRAPHY;  Surgeon: Algernon Huxley, MD;  Location: [facility];  Service: Cardiovascular;  Laterality: Right;     reports that she has never smoked. She has never used smokeless tobacco. She reports that she does not drink alcohol and does not use drugs.  No  Known Allergies  Family History  Problem Relation Age of Onset  . Rheumatologic disease Mother     Prior to Admission medications   Medication Sig Start Date End Date Taking? Authorizing Provider  allopurinol (ZYLOPRIM) 300 MG tablet Take 300 mg by mouth daily.    [provider]  amLODipine (NORVASC) 5 MG tablet Take 1 tablet (5 mg total) by mouth daily. 04/26/19 04/25/20  Kathie Dike, MD  aspirin EC 81 MG tablet Take 1 tablet (81 mg total) by mouth daily. 10/20/19   Algernon Huxley, MD  atorvastatin (LIPITOR) 10 MG tablet Take 1 tablet (10 mg total) by mouth daily. 10/20/19 10/19/20  Algernon Huxley, MD  clopidogrel (PLAVIX) 75 MG tablet Take 1 tablet (75 mg total) by mouth daily. 10/20/19   Algernon Huxley, MD  COLCRYS 0.6 MG tablet Take 1 tablet by mouth daily as needed.  04/14/19   [provider]  collagenase (SANTYL) ointment Apply 1 application topically daily. 02/26/20   [name], DPM  feeding supplement, ENSURE ENLIVE, (ENSURE ENLIVE) LIQD Take 237 mLs by mouth 2 (two) times daily between meals. 11/11/19   Enzo Bi, MD  gabapentin (NEURONTIN) 300 MG capsule Take 300 mg by mouth 3 (three) times daily. 10/10/19   [provider]  hydrALAZINE (  APRESOLINE) 50 MG tablet Take 50 mg by mouth 2 (two) times daily.     [provider]  hyoscyamine (LEVSIN SL) 0.125 MG SL tablet Place 0.125 mg under the tongue every 4 (four) hours as needed. 11/03/19   [provider]  metoprolol tartrate (LOPRESSOR) 25 MG tablet Take 25 mg by mouth 2 (two) times daily. 07/11/19   [provider]  pantoprazole (PROTONIX) 40 MG tablet Take 40 mg by mouth daily.  07/05/18   [provider]  POLY-IRON 150 FORTE 150-25-1 MG-MCG-MG CAPS Take 1 capsule by mouth every morning.  03/28/19   [provider]  SANTYL ointment APPLY ONE APPLICATION TOPICALLY DAILY AS DIRECTED **NEED WOUND SIZE/DAY SUPPLY INFO** 03/11/20   [name], DPM  SENNA LAXATIVE 8.6  MG tablet Take 1 tablet by mouth daily as needed.  11/05/19   [provider]  sertraline (ZOLOFT) 25 MG tablet Take 25 mg by mouth daily with breakfast.     [provider]    Physical Exam: Vitals:   03/30/20 1325 03/30/20 1326 03/30/20 1630  BP: (!) 155/65  (!) 175/85  Pulse: 85  86  Resp: 16  18  Temp: 97.9 F (36.6 C)    TempSrc: Oral    SpO2: 99%  99%  Weight:  57.6 kg   Height:  5' (1.524 m)     Physical Exam Vitals and nursing note reviewed.  Constitutional:      Appearance: Normal appearance.  HENT:     Head: Normocephalic and atraumatic.     Right Ear: External ear normal.     Left Ear: External ear normal.     Nose: Nose normal.  Eyes:     Extraocular Movements: Extraocular movements intact.  Cardiovascular:     Rate and Rhythm: Normal rate.     Pulses: Normal pulses.     Heart sounds: Normal heart sounds.  Pulmonary:     Effort: Pulmonary effort is normal.     Breath sounds: Normal breath sounds.  Abdominal:     Palpations: Abdomen is soft.  Musculoskeletal:     Right lower leg: No edema.     Left lower leg: No edema.       Feet:     Right Lower Extremity: Right leg is amputated above knee.  Feet:     Left foot:     Skin integrity: Skin breakdown, erythema, callus and dry skin present.  Skin:    General: Skin is warm.  Neurological:     General: No focal deficit present.     Mental Status: She is alert. She is disoriented.  Psychiatric:        Mood and Affect: Mood normal.        Behavior: Behavior normal.      Labs on Admission: I have personally reviewed following labs and imaging studies  CBC: Recent Labs  Lab 03/30/20 1328  WBC 10.5  HGB 10.8*  HCT 33.9*  MCV 80.9  PLT 939   Basic Metabolic Panel: Recent Labs  Lab 03/30/20 1328  NA 144  K 4.2  CL 104  CO2 25  GLUCOSE 100*  BUN 41*  CREATININE 1.77*  CALCIUM 9.5   GFR: Estimated Creatinine Clearance: 14.4 mL/min (A) (by C-G formula based on SCr of 1.77  mg/dL (H)). Liver Function Tests: Recent Labs  Lab 03/30/20 1328  AST 23  ALT 20  ALKPHOS 69  BILITOT 0.5  PROT 8.8*  ALBUMIN 3.4*  No results for input(s): LIPASE, AMYLASE in the last 168 hours. No results for input(s): AMMONIA in the last 168 hours. Coagulation Profile: No results for input(s): INR, PROTIME in the last 168 hours. Cardiac Enzymes: Recent Labs  Lab 03/30/20 1328  CKTOTAL 123   BNP (last 3 results) No results for input(s): PROBNP in the last 8760 hours. HbA1C: No results for input(s): HGBA1C in the last 72 hours. CBG: No results for input(s): GLUCAP in the last 168 hours. Lipid Profile: No results for input(s): CHOL, HDL, LDLCALC, TRIG, CHOLHDL, LDLDIRECT in the last 72 hours. Thyroid Function Tests: No results for input(s): TSH, T4TOTAL, FREET4, T3FREE, THYROIDAB in the last 72 hours. Anemia Panel: No results for input(s): VITAMINB12, FOLATE, FERRITIN, TIBC, IRON, RETICCTPCT in the last 72 hours. Urine analysis:    Component Value Date/Time   COLORURINE YELLOW (A) 11/05/2019 1457   APPEARANCEUR CLEAR (A) 11/05/2019 1457   LABSPEC 1.017 11/05/2019 1457   PHURINE 7.0 11/05/2019 1457   GLUCOSEU NEGATIVE 11/05/2019 1457   HGBUR NEGATIVE 11/05/2019 1457   BILIRUBINUR NEGATIVE 11/05/2019 1457   KETONESUR NEGATIVE 11/05/2019 1457   PROTEINUR NEGATIVE 11/05/2019 1457   NITRITE NEGATIVE 11/05/2019 1457   LEUKOCYTESUR NEGATIVE 11/05/2019 1457   No intake or output data in the 24 hours ending 03/30/20 1828 Lab Results  Component Value Date   CREATININE 1.77 (H) 03/30/2020   CREATININE 1.20 (H) 12/19/2019   CREATININE 1.16 (H) 12/18/2019    COVID-19 Labs  No results for input(s): DDIMER, FERRITIN, LDH, CRP in the last 72 hours.  Lab Results  Component Value Date   [place] NEGATIVE 03/30/2020   [place] NEGATIVE 12/18/2019   [place] NEGATIVE 11/10/2019   [place] NEGATIVE 11/05/2019    Radiological Exams on Admission: DG Ankle  Complete Left  Result Date: 03/30/2020 CLINICAL DATA:  Left heel wound and swelling. EXAM: LEFT ANKLE COMPLETE - 3+ VIEW COMPARISON:  None. FINDINGS: Large soft tissue defect noted on the plantar aspect of the midfoot. No wound is identified over the calcaneus. There is diffuse soft tissue swelling/edema suggesting cellulitis. No definite destructive bony changes to suggest osteomyelitis. I do not see any findings suspicious for septic arthritis. Extensive vascular calcifications. IMPRESSION: 1. Large soft tissue defect on the plantar aspect of the midfoot. 2. Diffuse soft tissue swelling/edema suggesting cellulitis. 3. No definite plain film findings for septic arthritis or osteomyelitis. Electronically Signed   By: Marijo Sanes M.D.   On: 03/30/2020 17:10    EKG: Independently reviewed.  None today.     Assessment/Plan HTN (hypertension) Assessment & Plan Blood pressure (!) 170/81, pulse 85, temperature 97.9 F (36.6 C), temperature source Oral, resp. rate 19, height 5' (1.524 m), weight 57.6 kg, SpO2 95 %. pt's home med regimen with metoprolol 25 mg and amlodipine  Continued.  PRN hydralazine continued.    CKD (chronic kidney disease) stage 4, GFR 15-29 ml/min [facility]) Assessment & Plan Lab Results  Component Value Date   CREATININE 1.77 (H) 03/30/2020   CREATININE 1.20 (H) 12/19/2019   CREATININE 1.16 (H) 12/18/2019  AKI suspect due to dehydration. We will continue with gentle ivf hydration, avoid contrast studies and renally dose meds.   Anemia Assessment & Plan H/h is low but stable.  Suspect ACD and iron supplement when low.   Foot ulcer, left [facility]) Assessment & Plan Images attached as below: Media Information   Document Information  Photos    03/30/2020 18:32  Attached To:  Hospital Encounter on 03/30/20  Source Information  Para Skeans, MD  [facility]   Media Information   Document Information  Photos    03/30/2020 18:32  Attached To:   Hospital Encounter on 03/30/20  Source Information  Para Skeans, MD  [facility]   Media Information   Document Information  Photos    03/30/2020 18:32  Attached To:  Hospital Encounter on 03/30/20  Source Information  Para Skeans, MD  [facility]   Media Information   Document Information  Photos    03/30/2020 18:31  Attached To:  Hospital Encounter on 03/30/20  Source Information  Para Skeans, MD  [facility]   Anti-infectives (From admission, onward)   Start     Dose/Rate Route Frequency Ordered Stop   03/30/20 1815  vancomycin (VANCOREADY) IVPB 1250 mg/250 mL        1,250 mg 166.7 mL/hr over 90 Minutes Intravenous  Once 03/30/20 1810       We will consult pharmacy for vancomycin dosing.  Podiatry consulted. Wound care consulted.    DVT prophylaxis:  heparin   Code Status:  Full code   Family Communication:  None at bedside   Disposition Plan:  TBD   Consults called:  Podiatry-Dr.baker.   Admission status: Status is: Inpatient  Remains inpatient appropriate because:Inpatient level of care appropriate due to severity of illness   Dispo: The patient is from: Home              Anticipated d/c is to: Home              Anticipated d/c date is: 3 days              Patient currently is not medically stable to d/c.   Para Skeans MD [facility] [phone] How to contact the [facility] Attending or Consulting provider [place] or covering provider during after hours [place], for this patient?    1. Check the care team in [facility] and look for a) attending/consulting [facility] provider listed and b) the [facility] team listed 2. Log into [email or web address] and use [place]'s universal password to access. If you do not have the password, please contact the hospital operator. 3. Locate the [facility] provider you are looking for under [facility] and page to a number that you can be directly reached. 4. If you still  have difficulty reaching the provider, please page the [facility] (Director on Call) for the Hospitalists listed on amion for assistance. [email or web address] Password [facility] 03/30/2020, 6:28 PM

## 2020-03-30 NOTE — Assessment & Plan Note (Signed)
H/h is low but stable.  Suspect ACD and iron supplement when low.

## 2020-03-30 NOTE — Telephone Encounter (Addendum)
Called stating that she was being seen for a pressure wound while in rehab ;  she was seen here for treatment 02-06-20 and was referred to wound care. Patient is scheduled to be seen at the wound center 04/08/2020. Olin Hauser states that there is no infection in her leg but she has poor circulation in her left leg and she believes that the pain she is having is coming from her leg and not the wound. This morning patient passed out from excruciating pain and screaming. Olin Hauser states that she was told by home health and other facilities to not call the ambulance. Olin Hauser aslo stated that patient will not eat or dink, this is the third day. Please advise.

## 2020-03-30 NOTE — Telephone Encounter (Signed)
Please advise about the note below.

## 2020-03-31 DIAGNOSIS — R7881 Bacteremia: Secondary | ICD-10-CM

## 2020-03-31 DIAGNOSIS — L97528 Non-pressure chronic ulcer of other part of left foot with other specified severity: Secondary | ICD-10-CM

## 2020-03-31 DIAGNOSIS — L03116 Cellulitis of left lower limb: Secondary | ICD-10-CM

## 2020-03-31 DIAGNOSIS — B9562 Methicillin resistant Staphylococcus aureus infection as the cause of diseases classified elsewhere: Secondary | ICD-10-CM

## 2020-03-31 DIAGNOSIS — N179 Acute kidney failure, unspecified: Secondary | ICD-10-CM

## 2020-03-31 DIAGNOSIS — N184 Chronic kidney disease, stage 4 (severe): Secondary | ICD-10-CM

## 2020-03-31 DIAGNOSIS — D649 Anemia, unspecified: Secondary | ICD-10-CM

## 2020-03-31 LAB — URINALYSIS, COMPLETE (UACMP) WITH MICROSCOPIC
Bilirubin Urine: NEGATIVE
Glucose, UA: NEGATIVE mg/dL
Hgb urine dipstick: NEGATIVE
Ketones, ur: NEGATIVE mg/dL
Nitrite: NEGATIVE
Protein, ur: NEGATIVE mg/dL
Specific Gravity, Urine: 1.015 (ref 1.005–1.030)
WBC, UA: >50 WBC/hpf — ABNORMAL HIGH (ref 0–5)
pH: 7 (ref 5.0–8.0)

## 2020-03-31 LAB — BASIC METABOLIC PANEL
Anion gap: 10 (ref 5–15)
BUN: 35 mg/dL — ABNORMAL HIGH (ref 8–23)
CO2: 27 mmol/L (ref 22–32)
Calcium: 9.1 mg/dL (ref 8.9–10.3)
Chloride: 104 mmol/L (ref 98–111)
Creatinine, Ser: 1.51 mg/dL — ABNORMAL HIGH (ref 0.44–1.00)
GFR, Estimated: 31 mL/min — ABNORMAL LOW (ref >60)
Glucose, Bld: 95 mg/dL (ref 70–99)
Potassium: 4.1 mmol/L (ref 3.5–5.1)
Sodium: 141 mmol/L (ref 135–145)

## 2020-03-31 LAB — BLOOD CULTURE ID PANEL (REFLEXED) - BCID2

## 2020-03-31 LAB — CBC
HCT: 30.1 % — ABNORMAL LOW (ref 36.0–46.0)
Hemoglobin: 10 g/dL — ABNORMAL LOW (ref 12.0–15.0)
MCH: 26.6 pg (ref 26.0–34.0)
MCHC: 33.2 g/dL (ref 30.0–36.0)
MCV: 80.1 fL (ref 80.0–100.0)
Platelets: 323 10*3/uL (ref 150–400)
RBC: 3.76 MIL/uL — ABNORMAL LOW (ref 3.87–5.11)
RDW: 15.2 % (ref 11.5–15.5)
WBC: 9.5 10*3/uL (ref 4.0–10.5)
nRBC: 0 % (ref 0.0–0.2)

## 2020-03-31 LAB — CK: Total CK: 159 U/L (ref 38–234)

## 2020-03-31 LAB — PROCALCITONIN: Procalcitonin: <0.1 ng/mL

## 2020-03-31 MED ORDER — HYDROXYZINE HCL 10 MG PO TABS
10.0000 mg | ORAL_TABLET | Freq: Once | ORAL | Status: AC
  Administered 2020-03-31: 10 mg via ORAL
  Filled 2020-03-31: qty 1

## 2020-03-31 MED ORDER — HYDROMORPHONE HCL 1 MG/ML IJ SOLN
0.5000 mg | Freq: Once | INTRAMUSCULAR | Status: AC
  Administered 2020-03-31: 0.5 mg via INTRAVENOUS
  Filled 2020-03-31: qty 0.5

## 2020-03-31 MED ORDER — KETOROLAC TROMETHAMINE 30 MG/ML IJ SOLN
INTRAMUSCULAR | Status: AC
  Administered 2020-03-31: 30 mg via INTRAVENOUS
  Filled 2020-03-31: qty 1

## 2020-03-31 MED ORDER — PIPERACILLIN-TAZOBACTAM 3.375 G IVPB
3.3750 g | Freq: Two times a day (BID) | INTRAVENOUS | Status: DC
  Administered 2020-03-31: 3.375 g via INTRAVENOUS
  Filled 2020-03-31: qty 50

## 2020-03-31 MED ORDER — SODIUM CHLORIDE 0.9 % IV SOLN: 460.0000 mg | INTRAVENOUS | Status: DC

## 2020-03-31 MED ORDER — PIPERACILLIN-TAZOBACTAM 3.375 G IVPB: 3.3750 g | Freq: Three times a day (TID) | INTRAVENOUS | Status: DC

## 2020-03-31 MED ORDER — SERTRALINE HCL 50 MG PO TABS
25.0000 mg | ORAL_TABLET | Freq: Every day | ORAL | Status: DC
  Administered 2020-04-01: 25 mg via ORAL
  Filled 2020-03-31 (×2): qty 1

## 2020-03-31 MED ORDER — SODIUM CHLORIDE 0.9 % IV SOLN
460.0000 mg | INTRAVENOUS | Status: DC
  Administered 2020-03-31: 460 mg via INTRAVENOUS
  Filled 2020-03-31 (×2): qty 9.2

## 2020-03-31 MED ORDER — KETOROLAC TROMETHAMINE 30 MG/ML IJ SOLN: 30.0000 mg | Freq: Once | INTRAMUSCULAR | Status: AC

## 2020-03-31 MED ORDER — MORPHINE SULFATE (PF) 2 MG/ML IV SOLN: 1.0000 mg | INTRAVENOUS | Status: DC | PRN

## 2020-03-31 MED ORDER — MORPHINE SULFATE (PF) 2 MG/ML IV SOLN
1.0000 mg | INTRAVENOUS | Status: DC | PRN
Start: 2020-03-31 — End: 2020-03-31

## 2020-03-31 NOTE — Consult Note (Signed)
NAME: [name]  DOB: 03/27/1921  MRN: [id number]  Date/Time: 03/31/2020 11:40 AM  REQUESTING PROVIDER: Dr. Jimmye Norman Subjective:  REASON FOR CONSULT: MRSA bacteremia ?No history available from patient- daughter in law at bed side who gave history [name] is a 86 y.o. 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  Patient was disoriented at presentation.  Vitals were blood pressure 155/65, heart rate of 85, temperature of 97.9 and respiratory rate of 16 and pulse ox of 99%. Labs revealed a WBC of 10.5, hemoglobin of 10.8 and platelet of 332. Sodium was 144, K of 4.2, glucose of 100, creatinine of 1.77. Blood culture was sent and she has been started on vancomycin.  She has been seen by podiatrist who found that she had a posterior heel pressure ulceration unstageable with stable eschar with no obvious infection. She was also seen by vascular who because of her advanced age has recommended palliative care rather than aggressive intervention like AKA. As the blood culture is positive for MRSA I am seeing the patient for the same.  As per her daughter in law pt after  Along stay in the rehab for 90 days went home to live with her son and her late September. She got a course of Levaquin in October for a cough from her PCP Pt had a left heel ulcer since the time she was in the rehab. After discharge she went to see the podiatrist Lennette Bihari patel on 02/12/20, who noted an Ulcer left heel eschar unstageable depth with hard fibrotic cap. he deferred debridement and treated with betadine wet to dry and to off load the pressure. Because of PVR he explained to family that she was at risk of losing the leg. No antibiotics were given as not deemed necessary. Pt was doing okay until a few days ago when she stopped eating and drinking and was in pain and was generally  screaming , hence was brought to the ED Pt now is awake but as per DIL she is confused from pain meds and not at her baseline She ahs a h/o c2 neck fracture Dil says she used to drink  A lot until 6 yrs ago when she quit  Past Medical History:  Diagnosis Date  . Arthritis   . GERD (gastroesophageal reflux disease)   . Hypertension   . Neck fracture ([place]) 2015   C2    Past Surgical History:  Procedure Laterality Date  . ABDOMINAL HYSTERECTOMY    . AMPUTATION Right 11/06/2019   Procedure: AMPUTATION ABOVE KNEE;  Surgeon: Algernon Huxley, MD;  Location: [facility];  Service: General;  Laterality: Right;  . CHOLECYSTECTOMY  08/06/14  . ESOPHAGOGASTRODUODENOSCOPY (EGD) WITH PROPOFOL N/A 08/03/2016   Procedure: ESOPHAGOGASTRODUODENOSCOPY (EGD) WITH PROPOFOL;  Surgeon: Ronald Lobo, MD;  Location: [facility];  Service: Gastroenterology;  Laterality: N/A;  . EYE SURGERY  2015   cataract  . LOWER EXTREMITY ANGIOGRAPHY Right 10/20/2019   Procedure: LOWER EXTREMITY ANGIOGRAPHY;  Surgeon: Algernon Huxley, MD;  Location: [facility];  Service: Cardiovascular;  Laterality: Right;    Social History   Socioeconomic History  . Marital status: Single    Spouse name: Not on file  . Number of children: Not on file  . Years of education: Not on file  . Highest education level: Not on  file  Occupational History  . Not on file  Tobacco Use  . Smoking status: Never Smoker  . Smokeless tobacco: Never Used  Substance and Sexual Activity  . Alcohol use: No    Alcohol/week: 0.0 standard drinks  . Drug use: No  . Sexual activity: Not on file  Other Topics Concern  . Not on file  Social History Narrative  . Not on file   Social Determinants of Health   Financial Resource Strain:   . Difficulty of Paying Living Expenses: Not on file  Food Insecurity:   . Worried About Charity fundraiser in the Last Year: Not on file  . Ran Out of Food in the Last Year: Not on file  Transportation Needs:     . Lack of Transportation (Medical): Not on file  . Lack of Transportation (Non-Medical): Not on file  Physical Activity:   . Days of Exercise per Week: Not on file  . Minutes of Exercise per Session: Not on file  Stress:   . Feeling of Stress : Not on file  Social Connections:   . Frequency of Communication with Friends and Family: Not on file  . Frequency of Social Gatherings with Friends and Family: Not on file  . Attends Religious Services: Not on file  . Active Member of Clubs or Organizations: Not on file  . Attends Archivist Meetings: Not on file  . Marital Status: Not on file  Intimate Partner Violence:   . Fear of Current or Ex-Partner: Not on file  . Emotionally Abused: Not on file  . Physically Abused: Not on file  . Sexually Abused: Not on file    Family History  Problem Relation Age of Onset  . Rheumatologic disease Mother    No Known Allergies  ? Current Facility-Administered Medications  Medication Dose Route Frequency Provider Last Rate Last Admin  . 0.9 %  sodium chloride infusion  250 mL Intravenous PRN Para Skeans, MD      . amLODipine (NORVASC) tablet 5 mg  5 mg Oral Daily Lucrezia Starch, MD   5 mg at 03/31/20 0956  . gabapentin (NEURONTIN) capsule 300 mg  300 mg Oral TID Lucrezia Starch, MD   300 mg at 03/31/20 0957  . heparin injection 5,000 Units  5,000 Units Subcutaneous Q8H Para Skeans, MD   5,000 Units at 03/31/20 0631  . metoprolol tartrate (LOPRESSOR) tablet 25 mg  25 mg Oral BID Lucrezia Starch, MD   25 mg at 03/31/20 0957  . morphine 2 MG/ML injection 1 mg  1 mg Intravenous Q3H PRN Wyvonnia Dusky, MD      . oxyCODONE-acetaminophen (PERCOCET/ROXICET) 5-325 MG per tablet 1 tablet  1 tablet Oral Q6H PRN Lang Snow, NP   1 tablet at 03/31/20 0957  . piperacillin-tazobactam (ZOSYN) IVPB 3.375 g  3.375 g Intravenous Q12H Dallie Piles, RPH 12.5 mL/hr at 03/31/20 1115 Rate Verify at 03/31/20 1115  . sodium chloride  flush (NS) 0.9 % injection 3 mL  3 mL Intravenous Q12H Para Skeans, MD   3 mL at 03/31/20 0958  . sodium chloride flush (NS) 0.9 % injection 3 mL  3 mL Intravenous Q12H Para Skeans, MD   3 mL at 03/30/20 2128  . sodium chloride flush (NS) 0.9 % injection 3 mL  3 mL Intravenous PRN Para Skeans, MD         Abtx:  Anti-infectives (From admission, onward)  Start     Dose/Rate Route Frequency Ordered Stop   03/31/20 1000  piperacillin-tazobactam (ZOSYN) IVPB 3.375 g        3.375 g 12.5 mL/hr over 240 Minutes Intravenous Every 12 hours 03/31/20 0739     03/31/20 0815  piperacillin-tazobactam (ZOSYN) IVPB 3.375 g  Status:  Discontinued        3.375 g 12.5 mL/hr over 240 Minutes Intravenous Every 8 hours 03/31/20 0722 03/31/20 0739   03/30/20 1815  vancomycin (VANCOREADY) IVPB 1250 mg/250 mL        1,250 mg 166.7 mL/hr over 90 Minutes Intravenous  Once 03/30/20 1810 03/30/20 2038      REVIEW OF SYSTEMS:  Not reliable  because of confusion and hard of hearing denies any pain, head ache, fever, nausea, vomiting, diarrhea, difficulty in passing urine says Objective:  VITALS:  BP (!) 137/58 (BP Location: Left Arm)   Pulse 61   Temp 98.4 F (36.9 C) (Oral)   Resp 20   Ht 5' (1.524 m)   Wt 57.6 kg   SpO2 96%   BMI 24.80 kg/m  PHYSICAL EXAM:  General: Alert, cooperative, no distress, confused, appropriately verbal at times Head: Normocephalic, without obvious abnormality, atraumatic. Eyes: rt corneal opacity ENT Nares normal. No drainage or sinus tenderness. Lips, mucosa, and tongue normal. No Thrush Neck: Supple, symmetrical, no adenopathy, thyroid: non tender no carotid bruit and no JVD. Back: No CVA tenderness. Lungs: Clear to auscultation bilaterally. No Wheezing or Rhonchi. No rales. Heart: Regular rate and rhythm, no murmur, rub or gallop. Abdomen: Soft, non-tender,not distended. Bowel sounds normal. No masses Extremities: Rt aka healed well Left heel ulcer has an  eschar    Skin: No rashes or lesions. Or bruising Lymph: Cervical, supraclavicular normal. Neurologic: Grossly non-focal Pertinent Labs Lab Results CBC    Component Value Date/Time   WBC 9.5 03/31/2020 0708   RBC 3.76 (L) 03/31/2020 0708   HGB 10.0 (L) 03/31/2020 0708   HGB 9.3 (L) 06/13/2014 1943   HCT 30.1 (L) 03/31/2020 0708   HCT 29.0 (L) 06/13/2014 1943   PLT 323 03/31/2020 0708   PLT 316 06/13/2014 1943   MCV 80.1 03/31/2020 0708   MCV 82 06/13/2014 1943   MCH 26.6 03/31/2020 0708   MCHC 33.2 03/31/2020 0708   RDW 15.2 03/31/2020 0708   RDW 17.7 (H) 08/03/2014 1006   RDW 15.1 (H) 06/13/2014 1943   LYMPHSABS 2.5 12/18/2019 1405   LYMPHSABS 1.7 08/03/2014 1006   MONOABS 0.7 12/18/2019 1405   EOSABS 0.1 12/18/2019 1405   EOSABS 0.0 08/03/2014 1006   BASOSABS 0.0 12/18/2019 1405   BASOSABS 0.0 08/03/2014 1006    CMP Latest Ref Rng & Units 03/31/2020 03/30/2020 12/19/2019  Glucose 70 - 99 mg/dL 95 100(H) -  BUN 8 - 23 mg/dL 35(H) 41(H) -  Creatinine 0.44 - 1.00 mg/dL 1.51(H) 1.77(H) -  Sodium 135 - 145 mmol/L 141 144 -  Potassium 3.5 - 5.1 mmol/L 4.1 4.2 5.1  Chloride 98 - 111 mmol/L 104 104 -  CO2 22 - 32 mmol/L 27 25 -  Calcium 8.9 - 10.3 mg/dL 9.1 9.5 -  Total Protein 6.5 - 8.1 g/dL - 8.8(H) -  Total Bilirubin 0.3 - 1.2 mg/dL - 0.5 -  Alkaline Phos 38 - 126 U/L - 69 -  AST 15 - 41 U/L - 23 -  ALT 0 - 44 U/L - 20 -      Microbiology: Recent Results (from the past 240  hour(s))  Aerobic/Anaerobic Culture (surgical/deep wound)     Status: None (Preliminary result)   Collection Time: 03/30/20  1:01 AM   Specimen: Wound  Result Value Ref Range Status   Specimen Description   Final    WOUND Performed at [facility], [address], [place], [place] [address]    Special Requests   Final    NONE Performed at [facility], [address] [place], [place] [address]    Gram Stain   Final    NO WBC SEEN ABUNDANT GRAM POSITIVE COCCI FEW  GRAM POSITIVE RODS Performed at [facility], [place] [address], [place], [place] [address]    Culture PENDING  Incomplete   Report Status PENDING  Incomplete  Respiratory Panel by RT PCR (Flu A&B, Covid) - Nasopharyngeal Swab     Status: None   Collection Time: 03/30/20  4:35 PM   Specimen: Nasopharyngeal Swab  Result Value Ref Range Status   SARS Coronavirus 2 by RT PCR NEGATIVE NEGATIVE Final    Comment: (NOTE) SARS-CoV-2 target nucleic acids are NOT DETECTED.  The SARS-CoV-2 RNA is generally detectable in upper respiratoy specimens during the acute phase of infection. The lowest concentration of SARS-CoV-2 viral copies this assay can detect is 131 copies/mL. A negative result does not preclude SARS-Cov-2 infection and should not be used as the sole basis for treatment or other patient management decisions. A negative result may occur with  improper specimen collection/handling, submission of specimen other than nasopharyngeal swab, presence of viral mutation(s) within the areas targeted by this assay, and inadequate number of viral copies (<131 copies/mL). A negative result must be combined with clinical observations, patient history, and epidemiological information. The expected result is Negative.  Fact Sheet for Patients:  PinkCheek.be  Fact Sheet for Healthcare Providers:  GravelBags.it  This test is no t yet approved or cleared by the [place] FDA and  has been authorized for detection and/or diagnosis of SARS-CoV-2 by FDA under an Emergency Use Authorization (EUA). This EUA will remain  in effect (meaning this test can be used) for the duration of the COVID-19 declaration under Section 564(b)(1) of the Act, 21 U.S.C. section 360bbb-3(b)(1), unless the authorization is terminated or revoked sooner.     Influenza A by PCR NEGATIVE NEGATIVE Final   Influenza B by PCR NEGATIVE NEGATIVE Final    Comment:  (NOTE) The Xpert Xpress SARS-CoV-2/FLU/RSV assay is intended as an aid in  the diagnosis of influenza from Nasopharyngeal swab specimens and  should not be used as a sole basis for treatment. Nasal washings and  aspirates are unacceptable for Xpert Xpress SARS-CoV-2/FLU/RSV  testing.  Fact Sheet for Patients: PinkCheek.be  Fact Sheet for Healthcare Providers: GravelBags.it  This test is not yet approved or cleared by the [place] FDA and  has been authorized for detection and/or diagnosis of SARS-CoV-2 by  FDA under an Emergency Use Authorization (EUA). This EUA will remain  in effect (meaning this test can be used) for the duration of the  Covid-19 declaration under Section 564(b)(1) of the Act, 21  U.S.C. section 360bbb-3(b)(1), unless the authorization is  terminated or revoked. Performed at [facility], [address] [place], [place] [address]   Blood culture (routine x 2)     Status: None (Preliminary result)   Collection Time: 03/30/20  6:52 PM   Specimen: BLOOD  Result Value Ref Range Status   Specimen Description BLOOD RIGHT ANTECUBITAL  Final  Special Requests   Final    BOTTLES DRAWN AEROBIC AND ANAEROBIC Blood Culture adequate volume   Culture  Setup Time   Final    Organism ID to follow GRAM POSITIVE COCCI IN BOTH AEROBIC AND ANAEROBIC BOTTLES CRITICAL RESULT CALLED TO, READ BACK BY AND VERIFIED WITH: Awanda Mink [id number] 03/31/2020 DB Performed at [facility], [address], [place], [place] [address]    Culture GRAM POSITIVE COCCI  Final   Report Status PENDING  Incomplete  Blood Culture ID Panel (Reflexed)     Status: Abnormal   Collection Time: 03/30/20  6:52 PM  Result Value Ref Range Status   Enterococcus faecalis NOT DETECTED NOT DETECTED Final   Enterococcus Faecium NOT DETECTED NOT DETECTED Final   Listeria monocytogenes NOT DETECTED NOT DETECTED Final    Staphylococcus species DETECTED (A) NOT DETECTED Final    Comment: CRITICAL RESULT CALLED TO, READ BACK BY AND VERIFIED WITH: MYRA SLAUGHTER. PHAR 1010 03/31/2020 DB    Staphylococcus aureus (BCID) DETECTED (A) NOT DETECTED Final    Comment: Methicillin (oxacillin)-resistant Staphylococcus aureus (MRSA). MRSA is predictably resistant to beta-lactam antibiotics (except ceftaroline). Preferred therapy is vancomycin unless clinically contraindicated. Patient requires contact precautions if  hospitalized. CRITICAL RESULT CALLED TO, READ BACK BY AND VERIFIED WITH: MYRA SLAUGHTER. PHAR 1010 03/31/2020 DB    Staphylococcus epidermidis NOT DETECTED NOT DETECTED Final   Staphylococcus lugdunensis NOT DETECTED NOT DETECTED Final   Streptococcus species NOT DETECTED NOT DETECTED Final   Streptococcus agalactiae NOT DETECTED NOT DETECTED Final   Streptococcus pneumoniae NOT DETECTED NOT DETECTED Final   Streptococcus pyogenes NOT DETECTED NOT DETECTED Final   A.calcoaceticus-baumannii NOT DETECTED NOT DETECTED Final   Bacteroides fragilis NOT DETECTED NOT DETECTED Final   Enterobacterales NOT DETECTED NOT DETECTED Final   Enterobacter cloacae complex NOT DETECTED NOT DETECTED Final   Escherichia coli NOT DETECTED NOT DETECTED Final   Klebsiella aerogenes NOT DETECTED NOT DETECTED Final   Klebsiella oxytoca NOT DETECTED NOT DETECTED Final   Klebsiella pneumoniae NOT DETECTED NOT DETECTED Final   Proteus species NOT DETECTED NOT DETECTED Final   Salmonella species NOT DETECTED NOT DETECTED Final   Serratia marcescens NOT DETECTED NOT DETECTED Final   Haemophilus influenzae NOT DETECTED NOT DETECTED Final   Neisseria meningitidis NOT DETECTED NOT DETECTED Final   Pseudomonas aeruginosa NOT DETECTED NOT DETECTED Final   Stenotrophomonas maltophilia NOT DETECTED NOT DETECTED Final   Candida albicans NOT DETECTED NOT DETECTED Final   Candida auris NOT DETECTED NOT DETECTED Final   Candida glabrata NOT  DETECTED NOT DETECTED Final   Candida krusei NOT DETECTED NOT DETECTED Final   Candida parapsilosis NOT DETECTED NOT DETECTED Final   Candida tropicalis NOT DETECTED NOT DETECTED Final   Cryptococcus neoformans/gattii NOT DETECTED NOT DETECTED Final   Meth resistant mecA/C and MREJ DETECTED (A) NOT DETECTED Final    Comment: CRITICAL RESULT CALLED TO, READ BACK BY AND VERIFIED WITH: MYRA SLAUGHTER. PHAR 1010 03/31/2020 DB Performed at [facility], [address] [place], [place] [address]   Blood culture (routine x 2)     Status: None (Preliminary result)   Collection Time: 03/30/20  8:23 PM   Specimen: BLOOD  Result Value Ref Range Status   Specimen Description BLOOD LEFT ANTECUBITAL  Final   Special Requests   Final    BOTTLES DRAWN AEROBIC AND ANAEROBIC Blood Culture adequate volume   Culture   Final    NO GROWTH < 12 HOURS Performed  at [facility], [address] [place], [place] [address]    Report Status PENDING  Incomplete    IMAGING RESULTS: Xray left ankle Large soft tissue defect on the plantar aspect of the midfoot. 2. Diffuse soft tissue swelling/edema suggesting cellulitis. 3. No definite plain film findings for septic arthritis or osteomyelitis. I have personally reviewed the films ? Impression/Recommendation ? ?MRSA bacteremia 1 set out of 2. ?Likely source could be the left heel ulcer but because of her age aggressive surgical intervention may not be an option. Because of CKD we will change the vancomycin to daptomycin. We will repeat blood cultures We will get a 2D echo Not sure whether TEE is worth the risk because of old C2 fracture and her age She will need long term antibiotic-   CKD  Anemia  Left heel ulcer unstageable Peripheral vascular disease Gangrene right leg status post AKA. Palliative has been consulted  ___________________________________________________ Discussed with her daughter in law in great detail Note:   This document was prepared using Dragon voice recognition software and may include unintentional dictation errors.

## 2020-03-31 NOTE — Consult Note (Signed)
Pharmacy Antibiotic Note  [name] is a 86 y.o. female admitted on 03/30/2020 with bacteremia likely secondary to L foot ulcer. PMH for HTN abd PVD s/p R AKA. Patient has seen an outpatient podiatrist with [facility], Dr. Posey Pronto who has treated the patient's wound recently. Patient was given vancomycin 1250mg  IV x1. Patient is also on Zosyn EI. Pharmacy has been consulted for Daptomycin dosing. 11/16 Bcx with [facility]; BCID 2/4 bottles from one set positive for MRSA. Baseline CK WNL. Ankle Xray with large soft tissue defect on the plantar aspect of the midfoot; diffuse soft tissue swelling/edema suggesting cellulitis; no definite plain film findings for septic arthritis or osteomyelitis.  Plan: Start daptomycin 460mg  (~8mg /kg) q48 hours  Monitor renal function and adjust dose as clinically indicated Monitor CK weekly  Height: 5' (152.4 cm) Weight: 57.6 kg (126 lb 15.8 oz) IBW/kg (Calculated) : 45.5  Temp (24hrs), Avg:98.3 F (36.8 C), Min:97.8 F (36.6 C), Max:98.7 F (37.1 C)  Recent Labs  Lab 03/30/20 1328 03/31/20 0708  WBC 10.5 9.5  CREATININE 1.77* 1.51*    Estimated Creatinine Clearance: 16.9 mL/min (A) (by C-G formula based on SCr of 1.51 mg/dL (H)).    No Known Allergies  Antimicrobials this admission: 11/16 Vancomycin x1 11/17 Zosyn >> 11/17 Daptomycin >>   Microbiology results: 11/16 BCx: GPC 11/16 BCID: 2/4 bottles (1 set) MRSA 11/17 UCx: pending  Thank you for allowing pharmacy to be a part of this patient's care.  Sherilyn Banker, PharmD Pharmacy Resident  03/31/2020 1:53 PM

## 2020-03-31 NOTE — Consult Note (Signed)
[facility] Consult Note  MRN : [id number]  [name] is a 86 y.o. (12-15-1920) female who presents with chief complaint of  Chief Complaint  Patient presents with  . Failure To Thrive  . Leg Pain  .  History of Present Illness: I am asked to see the patient by Dr. Luana Shu for a chronic left heel ulceration.  This has been present for months.  The patient lost her right leg earlier this year for severe ulceration/gangrenous changes with peripheral arterial disease.  This has healed well.  She is now not eating well and not drinking at home.  She was brought to the hospital yesterday by her son for that reason.  She does have dementia and really cannot provide any history today.  No family is currently at the bedside.  She is known to our practice and was assessed back in late September with ABIs which were noncompressible.  Her left digital pressure was 64 with fairly strong monophasic waveforms.  She has been complaining of a lot of pain in her left foot and ankle.  Current Facility-Administered Medications  Medication Dose Route Frequency Provider Last Rate Last Admin  . 0.9 %  sodium chloride infusion  250 mL Intravenous PRN Para Skeans, MD      . amLODipine (NORVASC) tablet 5 mg  5 mg Oral Daily Lucrezia Starch, MD   5 mg at 03/31/20 0956  . DAPTOmycin (CUBICIN) 460 mg in sodium chloride 0.9 % IVPB  460 mg Intravenous Q48H Ravishankar, Jayashree, MD      . gabapentin (NEURONTIN) capsule 300 mg  300 mg Oral TID Lucrezia Starch, MD   300 mg at 03/31/20 0957  . heparin injection 5,000 Units  5,000 Units Subcutaneous Q8H Para Skeans, MD   5,000 Units at 03/31/20 0631  . metoprolol tartrate (LOPRESSOR) tablet 25 mg  25 mg Oral BID Lucrezia Starch, MD   25 mg at 03/31/20 0957  . morphine 2 MG/ML injection 1 mg  1 mg Intravenous Q3H PRN Wyvonnia Dusky, MD      . oxyCODONE-acetaminophen (PERCOCET/ROXICET) 5-325 MG per tablet 1 tablet  1 tablet Oral  Q6H PRN Lang Snow, NP   1 tablet at 03/31/20 0957  . piperacillin-tazobactam (ZOSYN) IVPB 3.375 g  3.375 g Intravenous Q12H Dallie Piles, RPH 12.5 mL/hr at 03/31/20 1115 Rate Verify at 03/31/20 1115  . sertraline (ZOLOFT) tablet 25 mg  25 mg Oral Daily Beers, Brandon D, RPH      . sodium chloride flush (NS) 0.9 % injection 3 mL  3 mL Intravenous Q12H Para Skeans, MD   3 mL at 03/31/20 0958  . sodium chloride flush (NS) 0.9 % injection 3 mL  3 mL Intravenous Q12H Para Skeans, MD   3 mL at 03/30/20 2128  . sodium chloride flush (NS) 0.9 % injection 3 mL  3 mL Intravenous PRN Para Skeans, MD        Past Medical History:  Diagnosis Date  . Arthritis   . GERD (gastroesophageal reflux disease)   . Hypertension   . Neck fracture ([place]) 2015   C2    Past Surgical History:  Procedure Laterality Date  . ABDOMINAL HYSTERECTOMY    . AMPUTATION Right 11/06/2019   Procedure: AMPUTATION ABOVE KNEE;  Surgeon: Algernon Huxley, MD;  Location: [facility];  Service: General;  Laterality: Right;  . CHOLECYSTECTOMY  08/06/14  . ESOPHAGOGASTRODUODENOSCOPY (EGD)  WITH PROPOFOL N/A 08/03/2016   Procedure: ESOPHAGOGASTRODUODENOSCOPY (EGD) WITH PROPOFOL;  Surgeon: Ronald Lobo, MD;  Location: [facility];  Service: Gastroenterology;  Laterality: N/A;  . EYE SURGERY  2015   cataract  . LOWER EXTREMITY ANGIOGRAPHY Right 10/20/2019   Procedure: LOWER EXTREMITY ANGIOGRAPHY;  Surgeon: Algernon Huxley, MD;  Location: [facility];  Service: Cardiovascular;  Laterality: Right;    Social History   Tobacco Use  . Smoking status: Never Smoker  . Smokeless tobacco: Never Used  Substance Use Topics  . Alcohol use: No    Alcohol/week: 0.0 standard drinks  . Drug use: No     Family History  Problem Relation Age of Onset  . Rheumatologic disease Mother   No bleeding disorders, clotting disorders, or aneurysms  No Known Allergies   REVIEW OF SYSTEMS (Negative unless checked) Patient  unable to provide secondary to dementia.  Physical Examination  Vitals:   03/31/20 0350 03/31/20 0500 03/31/20 0800 03/31/20 1206  BP: 113/80  (!) 137/58 (!) 158/64  Pulse: 78  61 67  Resp:   20 16  Temp: 98.7 F (37.1 C)  98.4 F (36.9 C) 97.8 F (36.6 C)  TempSrc:   Oral Oral  SpO2: 100%  96% 100%  Weight:  57.6 kg    Height:       Body mass index is 24.8 kg/m. Gen:  WD/WN, NAD Head: [place]/AT, No temporalis wasting.  Ear/Nose/Throat: Hearing grossly intact, nares w/o erythema or drainage, oropharynx w/o Erythema/Exudate Eyes: Sclera non-icteric, conjunctiva clear Neck: Trachea midline.  No JVD.  Pulmonary:  Good air movement, respirations not labored, equal bilaterally.  Cardiac: Irregular Vascular:  Vessel Right Left  Radial Palpable Palpable                          PT  not palpable  not palpable  DP  not palpable  1+ palpable   Gastrointestinal: soft, non-tender/non-distended. No guarding/reflex.  Musculoskeletal: M/S 5/5 throughout.  Quarter sized necrotic ulceration on the left heel from pressure.  Right AKA well-healed. Neurologic: Sensation grossly intact in extremities.  Symmetrical.  Speech is fluent. Motor exam as listed above. Psychiatric: Judgment and insight are poor Dermatologic: Left heel ulceration as above      CBC Lab Results  Component Value Date   WBC 9.5 03/31/2020   HGB 10.0 (L) 03/31/2020   HCT 30.1 (L) 03/31/2020   MCV 80.1 03/31/2020   PLT 323 03/31/2020    BMET    Component Value Date/Time   NA 141 03/31/2020 0708   NA 146 (H) 08/03/2014 1006   NA 140 06/13/2014 1943   K 4.1 03/31/2020 0708   K 4.0 06/13/2014 1943   CL 104 03/31/2020 0708   CL 101 06/13/2014 1943   CO2 27 03/31/2020 0708   CO2 33 (H) 06/13/2014 1943   GLUCOSE 95 03/31/2020 0708   GLUCOSE 99 06/13/2014 1943   BUN 35 (H) 03/31/2020 0708   BUN 8 (L) 08/03/2014 1006   BUN 14 06/13/2014 1943   CREATININE 1.51 (H) 03/31/2020 0708   CREATININE 1.10  06/13/2014 1943   CALCIUM 9.1 03/31/2020 0708   CALCIUM 9.0 06/13/2014 1943   GFRNONAA 31 (L) 03/31/2020 0708   GFRNONAA 49 (L) 06/13/2014 1943   GFRAA 44 (L) 12/19/2019 0541   GFRAA 60 (L) 06/13/2014 1943   Estimated Creatinine Clearance: 16.9 mL/min (A) (by C-G formula based on SCr of 1.51 mg/dL (H)).  COAG  Lab Results  Component Value Date   INR 1.2 11/06/2019   INR 1.2 11/05/2019   INR 1.07 08/01/2016    Radiology DG Ankle Complete Left  Result Date: 03/30/2020 CLINICAL DATA:  Left heel wound and swelling. EXAM: LEFT ANKLE COMPLETE - 3+ VIEW COMPARISON:  None. FINDINGS: Large soft tissue defect noted on the plantar aspect of the midfoot. No wound is identified over the calcaneus. There is diffuse soft tissue swelling/edema suggesting cellulitis. No definite destructive bony changes to suggest osteomyelitis. I do not see any findings suspicious for septic arthritis. Extensive vascular calcifications. IMPRESSION: 1. Large soft tissue defect on the plantar aspect of the midfoot. 2. Diffuse soft tissue swelling/edema suggesting cellulitis. 3. No definite plain film findings for septic arthritis or osteomyelitis. Electronically Signed   By: Marijo Sanes M.D.   On: 03/30/2020 17:10      Assessment/Plan 1.  PAD with left heel ulceration.  This is a difficult location with a necrotic heel ulceration. She is known to our practice and was assessed back in late September with ABIs which were noncompressible.  Her left digital pressure was 64 with fairly strong monophasic waveforms.  Although her perfusion is not normal, it is decent.  Given her decline state and very advanced age I would strongly recommend considering palliative care as I think the only way to get this healed would be with an above-knee amputation which would be highly morbid for her.  I think goals of care with palliative care consult would be appropriate. 2.  Dementia.  Appears fairly progressed. 3.  Chronic kidney disease  stage IIIb.  Avoid contrast at this point as I think the yield will be fairly small from an angiogram.  There would be risk of worsening renal dysfunction.   Leotis Pain, MD  03/31/2020 1:50 PM    This note was created with Dragon medical transcription system.  Any error is purely unintentional

## 2020-03-31 NOTE — Progress Notes (Addendum)
PHARMACY - PHYSICIAN COMMUNICATION CRITICAL VALUE ALERT - BLOOD CULTURE IDENTIFICATION (BCID)  [name] is an 86 y.o. female who presented to [facility] on 03/30/2020 with a chief complaint of painful leg  Assessment:  Patient with history of R AKA,  Ulcer on heel of L foot.    Name of physician (or Provider) Contacted: Drs Delaine Lame and Jimmye Norman  Current antibiotics: piperacillin/tazobactam, Given vancomycin x1 dose last evening, based on renal function, predict still has therapeutic level  Changes to prescribed antibiotics recommended:  Recommendations accepted by provider - automatic ID consult. Will start daptomycin   Results for orders placed or performed during the hospital encounter of 03/30/20  Blood Culture ID Panel (Reflexed) (Collected: 03/30/2020  6:52 PM)  Result Value Ref Range   Enterococcus faecalis NOT DETECTED NOT DETECTED   Enterococcus Faecium NOT DETECTED NOT DETECTED   Listeria monocytogenes NOT DETECTED NOT DETECTED   Staphylococcus species DETECTED (A) NOT DETECTED   Staphylococcus aureus (BCID) DETECTED (A) NOT DETECTED   Staphylococcus epidermidis NOT DETECTED NOT DETECTED   Staphylococcus lugdunensis NOT DETECTED NOT DETECTED   Streptococcus species NOT DETECTED NOT DETECTED   Streptococcus agalactiae NOT DETECTED NOT DETECTED   Streptococcus pneumoniae NOT DETECTED NOT DETECTED   Streptococcus pyogenes NOT DETECTED NOT DETECTED   A.calcoaceticus-baumannii NOT DETECTED NOT DETECTED   Bacteroides fragilis NOT DETECTED NOT DETECTED   Enterobacterales NOT DETECTED NOT DETECTED   Enterobacter cloacae complex NOT DETECTED NOT DETECTED   Escherichia coli NOT DETECTED NOT DETECTED   Klebsiella aerogenes NOT DETECTED NOT DETECTED   Klebsiella oxytoca NOT DETECTED NOT DETECTED   Klebsiella pneumoniae NOT DETECTED NOT DETECTED   Proteus species NOT DETECTED NOT DETECTED   Salmonella species NOT DETECTED NOT DETECTED   Serratia marcescens NOT DETECTED NOT  DETECTED   Haemophilus influenzae NOT DETECTED NOT DETECTED   Neisseria meningitidis NOT DETECTED NOT DETECTED   Pseudomonas aeruginosa NOT DETECTED NOT DETECTED   Stenotrophomonas maltophilia NOT DETECTED NOT DETECTED   Candida albicans NOT DETECTED NOT DETECTED   Candida auris NOT DETECTED NOT DETECTED   Candida glabrata NOT DETECTED NOT DETECTED   Candida krusei NOT DETECTED NOT DETECTED   Candida parapsilosis NOT DETECTED NOT DETECTED   Candida tropicalis NOT DETECTED NOT DETECTED   Cryptococcus neoformans/gattii NOT DETECTED NOT DETECTED   Meth resistant mecA/C and MREJ DETECTED (A) NOT DETECTED    Doreene Eland, PharmD, BCPS.   Work Cell: [phone] 03/31/2020 11:37 AM

## 2020-03-31 NOTE — Consult Note (Signed)
PODIATRY / FOOT AND ANKLE SURGERY CONSULTATION NOTE  Requesting Physician: Eppie Gibson, MD  Reason for consult: Left heel ulcer  Chief Complaint: Left heel sore   HPI: [name] is a 86 y.o. female who presented to [facility] due to left foot ulcer.  Patient was brought in by son due to increased left heel pain.  Patient has history of dementia but has awake and alert and cooperative but disoriented.  She does not know that she is in the hospital today.  Patient was brought in by her son yesterday for evaluation management.  Podiatry team was consulted for the left heel ulceration.  Patient has a history of right AKA with Dr. Lucky Cowboy.  Patient has seen an outpatient podiatrist with [facility], Dr. Posey Pronto who has treated the patient's wound recently.  Patient resting in bed comfortably today but does complain of left heel pain.  PMHx:  Past Medical History:  Diagnosis Date  . Arthritis   . GERD (gastroesophageal reflux disease)   . Hypertension   . Neck fracture ([place]) 2015   C2    Surgical Hx:  Past Surgical History:  Procedure Laterality Date  . ABDOMINAL HYSTERECTOMY    . AMPUTATION Right 11/06/2019   Procedure: AMPUTATION ABOVE KNEE;  Surgeon: Algernon Huxley, MD;  Location: [facility] ORS;  Service: General;  Laterality: Right;  . CHOLECYSTECTOMY  08/06/14  . ESOPHAGOGASTRODUODENOSCOPY (EGD) WITH PROPOFOL N/A 08/03/2016   Procedure: ESOPHAGOGASTRODUODENOSCOPY (EGD) WITH PROPOFOL;  Surgeon: Ronald Lobo, MD;  Location: [facility];  Service: Gastroenterology;  Laterality: N/A;  . EYE SURGERY  2015   cataract  . LOWER EXTREMITY ANGIOGRAPHY Right 10/20/2019   Procedure: LOWER EXTREMITY ANGIOGRAPHY;  Surgeon: Algernon Huxley, MD;  Location: [facility];  Service: Cardiovascular;  Laterality: Right;    FHx:  Family History  Problem Relation Age of Onset  . Rheumatologic disease Mother     Social History:  reports that she has never smoked. She has never used smokeless  tobacco. She reports that she does not drink alcohol and does not use drugs.  Allergies: No Known Allergies  Review of Systems: Unable to assess due to patient's mental status  Medications Prior to Admission  Medication Sig Dispense Refill  . folic acid (FOLVITE) 1 MG tablet Take 1 mg by mouth daily.    Marland Kitchen allopurinol (ZYLOPRIM) 300 MG tablet Take 300 mg by mouth daily.    Marland Kitchen amLODipine (NORVASC) 5 MG tablet Take 1 tablet (5 mg total) by mouth daily. 30 tablet 11  . aspirin EC 81 MG tablet Take 1 tablet (81 mg total) by mouth daily. 150 tablet 2  . atorvastatin (LIPITOR) 10 MG tablet Take 1 tablet (10 mg total) by mouth daily. 30 tablet 11  . clopidogrel (PLAVIX) 75 MG tablet Take 1 tablet (75 mg total) by mouth daily. 30 tablet 11  . COLCRYS 0.6 MG tablet Take 1 tablet by mouth daily as needed.     . collagenase (SANTYL) ointment Apply 1 application topically daily. 30 g 1  . feeding supplement, ENSURE ENLIVE, (ENSURE ENLIVE) LIQD Take 237 mLs by mouth 2 (two) times daily between meals. 237 mL 12  . gabapentin (NEURONTIN) 300 MG capsule Take 300 mg by mouth 3 (three) times daily.    . hydrALAZINE (APRESOLINE) 50 MG tablet Take 50 mg by mouth 2 (two) times daily.     . hyoscyamine (LEVSIN SL) 0.125 MG SL tablet Place 0.125 mg under the tongue every  4 (four) hours as needed.    . metoprolol tartrate (LOPRESSOR) 25 MG tablet Take 25 mg by mouth 2 (two) times daily.    . pantoprazole (PROTONIX) 40 MG tablet Take 40 mg by mouth daily.     Marland Kitchen POLY-IRON 150 FORTE 150-25-1 MG-MCG-MG CAPS Take 1 capsule by mouth every morning.     Marland Kitchen SANTYL ointment APPLY ONE APPLICATION TOPICALLY DAILY AS DIRECTED **NEED WOUND SIZE/DAY SUPPLY INFO** (Patient not taking: Reported on 03/31/2020) 30 g 0  . SENNA LAXATIVE 8.6 MG tablet Take 1 tablet by mouth daily as needed.     . sertraline (ZOLOFT) 25 MG tablet Take 25 mg by mouth daily with breakfast.       Physical Exam: General: Alert and cooperative but not  oriented.  No apparent distress.  Vascular: Nonpalpable pulses left foot, no hair growth to digits left, mild to moderate nonpitting lower extremity edema left.  Neuro: Light touch sensation appears to be diminished to left lower extremity.  Derm: Large stable eschar noted to the posterior lateral aspect that extends to a portion of the plantar aspect the left heel.  Appears to be stable, the edges of the eschar appear to be fibrogranular but the central core appears to be very well adhered and stable, minimal to no erythema present to this area, minimal to no drainage, no purulence.  Skin appears to be very thin and atrophic to the left lower extremity.    Nails appear to be thickened and dystrophic at x5 to the left foot and grossly large and discolored.  MSK: Pain on palpation left heel.  Right AKA.  Results for orders placed or performed during the hospital encounter of 03/30/20 (from the past 48 hour(s))  Aerobic/Anaerobic Culture (surgical/deep wound)     Status: None (Preliminary result)   Collection Time: 03/30/20  1:01 AM   Specimen: Wound  Result Value Ref Range   Specimen Description      WOUND Performed at [facility], [address], [place], [place] [address]    Special Requests      NONE Performed at [facility], [place], [place] [address]    Gram Stain      NO WBC SEEN ABUNDANT GRAM POSITIVE COCCI FEW GRAM POSITIVE RODS Performed at [facility], [place] [address], [place], [place] [address]    Culture PENDING    Report Status PENDING   Basic metabolic panel     Status: Abnormal   Collection Time: 03/30/20  1:28 PM  Result Value Ref Range   Sodium 144 135 - 145 mmol/L   Potassium 4.2 3.5 - 5.1 mmol/L   Chloride 104 98 - 111 mmol/L   CO2 25 22 - 32 mmol/L   Glucose, Bld 100 (H) 70 - 99 mg/dL    Comment: Glucose reference range applies only to samples taken after fasting for at least 8 hours.   BUN 41 (H) 8 - 23 mg/dL     Creatinine, Ser 1.77 (H) 0.44 - 1.00 mg/dL   Calcium 9.5 8.9 - 10.3 mg/dL   GFR, Estimated 26 (L) >60 mL/min    Comment: (NOTE) Calculated using the CKD-EPI Creatinine Equation (2021)    Anion gap 15 5 - 15    Comment: Performed at [facility], [address] [place], [place] [address]  CBC     Status: Abnormal   Collection Time: 03/30/20  1:28 PM  Result Value Ref Range   WBC  10.5 4.0 - 10.5 K/uL   RBC 4.19 3.87 - 5.11 MIL/uL   Hemoglobin 10.8 (L) 12.0 - 15.0 g/dL   HCT 33.9 (L) 36 - 46 %   MCV 80.9 80.0 - 100.0 fL   MCH 25.8 (L) 26.0 - 34.0 pg   MCHC 31.9 30.0 - 36.0 g/dL   RDW 15.0 11.5 - 15.5 %   Platelets 332 150 - 400 K/uL   nRBC 0.0 0.0 - 0.2 %    Comment: Performed at [facility], [address] [place], [place] [address]  Hepatic function panel     Status: Abnormal   Collection Time: 03/30/20  1:28 PM  Result Value Ref Range   Total Protein 8.8 (H) 6.5 - 8.1 g/dL   Albumin 3.4 (L) 3.5 - 5.0 g/dL   AST 23 15 - 41 U/L   ALT 20 0 - 44 U/L   Alkaline Phosphatase 69 38 - 126 U/L   Total Bilirubin 0.5 0.3 - 1.2 mg/dL   Bilirubin, Direct <0.1 0.0 - 0.2 mg/dL   Indirect Bilirubin NOT CALCULATED 0.3 - 0.9 mg/dL    Comment: Performed at [facility], [address] [place], [place] [address]  CK     Status: None   Collection Time: 03/30/20  1:28 PM  Result Value Ref Range   Total CK 123 38.0 - 234.0 U/L    Comment: Performed at [facility], [address] [place], [place] [address]  Sedimentation rate     Status: Abnormal   Collection Time: 03/30/20  1:52 PM  Result Value Ref Range   Sed Rate 65 (H) 0 - 30 mm/hr    Comment: Performed at [facility], [address], [place],  [address]  Respiratory Panel by RT PCR (Flu A&B, Covid) - Nasopharyngeal Swab     Status: None   Collection Time: 03/30/20  4:35 PM   Specimen: Nasopharyngeal Swab  Result Value Ref Range   SARS Coronavirus 2 by RT PCR NEGATIVE  NEGATIVE    Comment: (NOTE) SARS-CoV-2 target nucleic acids are NOT DETECTED.  The SARS-CoV-2 RNA is generally detectable in upper respiratoy specimens during the acute phase of infection. The lowest concentration of SARS-CoV-2 viral copies this assay can detect is 131 copies/mL. A negative result does not preclude SARS-Cov-2 infection and should not be used as the sole basis for treatment or other patient management decisions. A negative result may occur with  improper specimen collection/handling, submission of specimen other than nasopharyngeal swab, presence of viral mutation(s) within the areas targeted by this assay, and inadequate number of viral copies (<131 copies/mL). A negative result must be combined with clinical observations, patient history, and epidemiological information. The expected result is Negative.  Fact Sheet for Patients:  PinkCheek.be  Fact Sheet for Healthcare Providers:  GravelBags.it  This test is no t yet approved or cleared by the [place] FDA and  has been authorized for detection and/or diagnosis of SARS-CoV-2 by FDA under an Emergency Use Authorization (EUA). This EUA will remain  in effect (meaning this test can be used) for the duration of the COVID-19 declaration under Section 564(b)(1) of the Act, 21 U.S.C. section 360bbb-3(b)(1), unless the authorization is terminated or revoked sooner.     Influenza A by PCR NEGATIVE NEGATIVE   Influenza B by PCR NEGATIVE NEGATIVE    Comment: (NOTE) The Xpert Xpress SARS-CoV-2/FLU/RSV assay is intended as an aid in  the diagnosis of influenza from Nasopharyngeal swab specimens  and  should not be used as a sole basis for treatment. Nasal washings and  aspirates are unacceptable for Xpert Xpress SARS-CoV-2/FLU/RSV  testing.  Fact Sheet for Patients: PinkCheek.be  Fact Sheet for Healthcare  Providers: GravelBags.it  This test is not yet approved or cleared by the [place] FDA and  has been authorized for detection and/or diagnosis of SARS-CoV-2 by  FDA under an Emergency Use Authorization (EUA). This EUA will remain  in effect (meaning this test can be used) for the duration of the  Covid-19 declaration under Section 564(b)(1) of the Act, 21  U.S.C. section 360bbb-3(b)(1), unless the authorization is  terminated or revoked. Performed at [facility], [address] [place], [place] [address]   C-reactive protein     Status: Abnormal   Collection Time: 03/30/20  4:35 PM  Result Value Ref Range   CRP 3.2 (H) <1.0 mg/dL    Comment: Performed at [facility], [place] [address], [place], [place] [address]  Blood culture (routine x 2)     Status: None (Preliminary result)   Collection Time: 03/30/20  6:52 PM   Specimen: BLOOD  Result Value Ref Range   Specimen Description BLOOD RIGHT ANTECUBITAL    Special Requests      BOTTLES DRAWN AEROBIC AND ANAEROBIC Blood Culture adequate volume   Culture  Setup Time      Organism ID to follow GRAM POSITIVE COCCI IN BOTH AEROBIC AND ANAEROBIC BOTTLES CRITICAL RESULT CALLED TO, READ BACK BY AND VERIFIED WITH: Awanda Mink 1010 03/31/2020 DB Performed at [facility], [address], [place], [place] [address]    Culture GRAM POSITIVE COCCI    Report Status PENDING   Blood Culture ID Panel (Reflexed)     Status: Abnormal   Collection Time: 03/30/20  6:52 PM  Result Value Ref Range   Enterococcus faecalis NOT DETECTED NOT DETECTED   Enterococcus Faecium NOT DETECTED NOT DETECTED   Listeria monocytogenes NOT DETECTED NOT DETECTED   Staphylococcus species DETECTED (A) NOT DETECTED    Comment: CRITICAL RESULT CALLED TO, READ BACK BY AND VERIFIED WITH: MYRA SLAUGHTER. PHAR 1010 03/31/2020 DB    Staphylococcus aureus (BCID) DETECTED (A) NOT DETECTED    Comment:  Methicillin (oxacillin)-resistant Staphylococcus aureus (MRSA). MRSA is predictably resistant to beta-lactam antibiotics (except ceftaroline). Preferred therapy is vancomycin unless clinically contraindicated. Patient requires contact precautions if  hospitalized. CRITICAL RESULT CALLED TO, READ BACK BY AND VERIFIED WITH: MYRA SLAUGHTER. PHAR 1010 03/31/2020 DB    Staphylococcus epidermidis NOT DETECTED NOT DETECTED   Staphylococcus lugdunensis NOT DETECTED NOT DETECTED   Streptococcus species NOT DETECTED NOT DETECTED   Streptococcus agalactiae NOT DETECTED NOT DETECTED   Streptococcus pneumoniae NOT DETECTED NOT DETECTED   Streptococcus pyogenes NOT DETECTED NOT DETECTED   A.calcoaceticus-baumannii NOT DETECTED NOT DETECTED   Bacteroides fragilis NOT DETECTED NOT DETECTED   Enterobacterales NOT DETECTED NOT DETECTED   Enterobacter cloacae complex NOT DETECTED NOT DETECTED   Escherichia coli NOT DETECTED NOT DETECTED   Klebsiella aerogenes NOT DETECTED NOT DETECTED   Klebsiella oxytoca NOT DETECTED NOT DETECTED   Klebsiella pneumoniae NOT DETECTED NOT DETECTED   Proteus species NOT DETECTED NOT DETECTED   Salmonella species NOT DETECTED NOT DETECTED   Serratia marcescens NOT DETECTED NOT DETECTED   Haemophilus influenzae NOT DETECTED NOT DETECTED   Neisseria meningitidis NOT DETECTED NOT DETECTED   Pseudomonas aeruginosa NOT DETECTED NOT DETECTED   Stenotrophomonas maltophilia NOT DETECTED NOT DETECTED  Candida albicans NOT DETECTED NOT DETECTED   Candida auris NOT DETECTED NOT DETECTED   Candida glabrata NOT DETECTED NOT DETECTED   Candida krusei NOT DETECTED NOT DETECTED   Candida parapsilosis NOT DETECTED NOT DETECTED   Candida tropicalis NOT DETECTED NOT DETECTED   Cryptococcus neoformans/gattii NOT DETECTED NOT DETECTED   Meth resistant mecA/C and MREJ DETECTED (A) NOT DETECTED    Comment: CRITICAL RESULT CALLED TO, READ BACK BY AND VERIFIED WITH: MYRA SLAUGHTER. PHAR 1010  03/31/2020 DB Performed at [facility], [address] [place], [place] [address]   Blood culture (routine x 2)     Status: None (Preliminary result)   Collection Time: 03/30/20  8:23 PM   Specimen: BLOOD  Result Value Ref Range   Specimen Description BLOOD LEFT ANTECUBITAL    Special Requests      BOTTLES DRAWN AEROBIC AND ANAEROBIC Blood Culture adequate volume   Culture      NO GROWTH < 12 HOURS Performed at [facility], [address] [place], [place] [address]    Report Status PENDING   Urinalysis, Complete w Microscopic     Status: Abnormal   Collection Time: 03/31/20  3:53 AM  Result Value Ref Range   Color, Urine YELLOW (A) YELLOW   APPearance CLOUDY (A) CLEAR   Specific Gravity, Urine 1.015 1.005 - 1.030   pH 7.0 5.0 - 8.0   Glucose, UA NEGATIVE NEGATIVE mg/dL   Hgb urine dipstick NEGATIVE NEGATIVE   Bilirubin Urine NEGATIVE NEGATIVE   Ketones, ur NEGATIVE NEGATIVE mg/dL   Protein, ur NEGATIVE NEGATIVE mg/dL   Nitrite NEGATIVE NEGATIVE   Leukocytes,Ua MODERATE (A) NEGATIVE   RBC / HPF 6-10 0 - 5 RBC/hpf   WBC, UA >50 (H) 0 - 5 WBC/hpf   Bacteria, UA RARE (A) NONE SEEN   Squamous Epithelial / LPF 0-5 0 - 5   Hyaline Casts, UA PRESENT    Non Squamous Epithelial PRESENT (A) NONE SEEN    Comment: Performed at [facility], [address] [place], [place] [address]  CBC     Status: Abnormal   Collection Time: 03/31/20  7:08 AM  Result Value Ref Range   WBC 9.5 4.0 - 10.5 K/uL   RBC 3.76 (L) 3.87 - 5.11 MIL/uL   Hemoglobin 10.0 (L) 12.0 - 15.0 g/dL   HCT 30.1 (L) 36 - 46 %   MCV 80.1 80.0 - 100.0 fL   MCH 26.6 26.0 - 34.0 pg   MCHC 33.2 30.0 - 36.0 g/dL   RDW 15.2 11.5 - 15.5 %   Platelets 323 150 - 400 K/uL   nRBC 0.0 0.0 - 0.2 %    Comment: Performed at [facility], [address] [place], [place] [address]  Procalcitonin - Baseline     Status: None   Collection Time: 03/31/20  7:08 AM  Result Value Ref Range    Procalcitonin <0.10 ng/mL    Comment:        Interpretation: PCT (Procalcitonin) <= 0.5 ng/mL: Systemic infection (sepsis) is not likely. Local bacterial infection is possible. (NOTE)       Sepsis PCT Algorithm           Lower Respiratory Tract                                      Infection PCT Algorithm    ----------------------------     ----------------------------  PCT < 0.25 ng/mL                PCT < 0.10 ng/mL          Strongly encourage             Strongly discourage   discontinuation of antibiotics    initiation of antibiotics    ----------------------------     -----------------------------       PCT 0.25 - 0.50 ng/mL            PCT 0.10 - 0.25 ng/mL               OR       >80% decrease in PCT            Discourage initiation of                                            antibiotics      Encourage discontinuation           of antibiotics    ----------------------------     -----------------------------         PCT >= 0.50 ng/mL              PCT 0.26 - 0.50 ng/mL               AND        <80% decrease in PCT             Encourage initiation of                                             antibiotics       Encourage continuation           of antibiotics    ----------------------------     -----------------------------        PCT >= 0.50 ng/mL                  PCT > 0.50 ng/mL               AND         increase in PCT                  Strongly encourage                                      initiation of antibiotics    Strongly encourage escalation           of antibiotics                                     -----------------------------                                           PCT <= 0.25 ng/mL  OR                                        > 80% decrease in PCT                                      Discontinue / Do not initiate                                             antibiotics  Performed at [facility], [address] [place], [place] [address]   Basic metabolic panel     Status: Abnormal   Collection Time: 03/31/20  7:08 AM  Result Value Ref Range   Sodium 141 135 - 145 mmol/L   Potassium 4.1 3.5 - 5.1 mmol/L   Chloride 104 98 - 111 mmol/L   CO2 27 22 - 32 mmol/L   Glucose, Bld 95 70 - 99 mg/dL    Comment: Glucose reference range applies only to samples taken after fasting for at least 8 hours.   BUN 35 (H) 8 - 23 mg/dL   Creatinine, Ser 1.51 (H) 0.44 - 1.00 mg/dL   Calcium 9.1 8.9 - 10.3 mg/dL   GFR, Estimated 31 (L) >60 mL/min    Comment: (NOTE) Calculated using the CKD-EPI Creatinine Equation (2021)    Anion gap 10 5 - 15    Comment: Performed at [facility], [place] [place], [place] [address]  CK     Status: None   Collection Time: 03/31/20  7:08 AM  Result Value Ref Range   Total CK 159 38.0 - 234.0 U/L    Comment: Performed at [facility], [address], [place], [place] [address]   DG Ankle Complete Left  Result Date: 03/30/2020 CLINICAL DATA:  Left heel wound and swelling. EXAM: LEFT ANKLE COMPLETE - 3+ VIEW COMPARISON:  None. FINDINGS: Large soft tissue defect noted on the plantar aspect of the midfoot. No wound is identified over the calcaneus. There is diffuse soft tissue swelling/edema suggesting cellulitis. No definite destructive bony changes to suggest osteomyelitis. I do not see any findings suspicious for septic arthritis. Extensive vascular calcifications. IMPRESSION: 1. Large soft tissue defect on the plantar aspect of the midfoot. 2. Diffuse soft tissue swelling/edema suggesting cellulitis. 3. No definite plain film findings for septic arthritis or osteomyelitis. Electronically Signed   By: Marijo Sanes M.D.   On: 03/30/2020 17:10    Blood pressure (!) 158/64, pulse 67, temperature 97.8 F (36.6 C), temperature source Oral, resp. rate 16, height 5' (1.524 m), weight 57.6 kg, SpO2 100 %.  Assessment 1. Left posterior  heel pressure ulceration, unstageable, stable eschar 2. Left heel pain secondary to above condition 3. PVD with left lower extremity pain/claudication 4. Dementia  Plan -Patient seen and examined. -Wound appears to be stable at this time on examination appears to be a stable eschar with minimal to no erythema around the area, patient does have swelling to the whole lower extremity but it is likely at baseline currently. -Wound culture currently with no growth along the eschar area. -Applied Betadine soaked gauze to the wound followed by ABD, Kerlix, tape and placed Prevalon  boot.  Patient to continue with these dressing changes daily. -This is likely a palliative type wound that is likely to never get better to the patient's history of peripheral vascular disease and fragility of tissue.  If wound does become infected in the future would likely recommend amputation again in the long-term as patient would not likely do well with local wound care or partial calcanectomy. -Appreciate recommendations for antibiotic therapy.  Believe patient's blood flow limits antibiotic therapy to the tissues at this level.  May use prophylactically at this point. -Nails x5 left foot debrided without incident with sterile nail nipper.  Patient tolerated procedure well. -Consultation placed with vascular surgery. -No further care indicated at this time for the left heel ulceration.  Patient can follow back up with Dr. Posey Pronto or with the wound care center.  Podiatry team to sign off at this time.  Please reconsult if any problems arise.  Caroline More, DPM 03/31/2020, 1:04 PM

## 2020-03-31 NOTE — Progress Notes (Signed)
PIV consult: Pt has PRN pain med and Q 48h scheduled antibiotic. Recommend waiting until IV is needed to replace for vein preservation. Discussed with Verdis Frederickson, RN: she will enter STAT consult if IV med is needed.

## 2020-03-31 NOTE — Progress Notes (Addendum)
PROGRESS NOTE    [name]  JXB:[id number] DOB: Apr 13, 1921 DOA: 03/30/2020 PCP: Jodi Marble, MD   Assessment & Plan:   Principal Problem:   Foot ulcer, left ([place]) Active Problems:   HTN (hypertension)   CKD (chronic kidney disease) stage 4, GFR 15-29 ml/min (HCC)   Anemia  Left foot ulcer: continue on IV zosyn. Podiatry & vascular surg consulted. Hx of PVD. Wound care consulted. Morphine, percocet prn for pain. Palliative care consulted  Bacteremia: growing gram positive cocci, sens pending. Continue on IV vanco. ID consulted   Dementia: continue w/ supportive care.  HTN: continue on home dose of metoprolol, amlodipine   CKDIV: Cr is trending down from day prior. Avoid nephrotoxic meds. Will continue to monitor   ACD: no need for a transfusion at this time. Will continue to monitor H&H   DVT prophylaxis: heparin  Code Status: full  Family Communication: discussed pt's care w/ pt's son, [name], & answered his questions  Disposition Plan: likely d/c back home    Status is: Inpatient  Remains inpatient appropriate because:Ongoing diagnostic testing needed not appropriate for outpatient work up, Unsafe d/c plan and IV treatments appropriate due to intensity of illness or inability to take PO   Dispo: The patient is from: Home              Anticipated d/c is to: Home vs SNF               Anticipated d/c date is: 3 days              Patient currently is not medically stable to d/c.     Consultants:   ID  Podiatry  Vascular surg   Palliative care    Procedures:    Antimicrobials: vanco, zosyn    Subjective: Pt is oriented to self only   Objective: Vitals:   03/30/20 2000 03/30/20 2319 03/31/20 0350 03/31/20 0500  BP: (!) 175/76 (!) 133/59 113/80   Pulse: 88 72 78   Resp: 20 20    Temp: 98.4 F (36.9 C) 98.2 F (36.8 C) 98.7 F (37.1 C)   TempSrc: Oral Oral    SpO2: 98% 93% 100%   Weight:    57.6 kg  Height:        Intake/Output  Summary (Last 24 hours) at 03/31/2020 0751 Last data filed at 03/31/2020 0300 Gross per 24 hour  Intake 0 ml  Output --  Net 0 ml   Filed Weights   03/30/20 1326 03/31/20 0500  Weight: 57.6 kg 57.6 kg    Examination:  General exam: Appears calm and comfortable  Respiratory system: Clear to auscultation. Respiratory effort normal. Cardiovascular system: S1 & S2 +. No rubs, gallops or clicks.  Gastrointestinal system: Abdomen is nondistended, soft and nontender. Normal bowel sounds heard. Central nervous system: Alert and oriented. Moves all extremities Psychiatry: Judgement and insight appear abnormal. Flat mood and affect    Data Reviewed: I have personally reviewed following labs and imaging studies  CBC: Recent Labs  Lab 03/30/20 1328 03/31/20 0708  WBC 10.5 9.5  HGB 10.8* 10.0*  HCT 33.9* 30.1*  MCV 80.9 80.1  PLT 332 130   Basic Metabolic Panel: Recent Labs  Lab 03/30/20 1328 03/31/20 0708  NA 144 141  K 4.2 4.1  CL 104 104  CO2 25 27  GLUCOSE 100* 95  BUN 41* 35*  CREATININE 1.77* 1.51*  CALCIUM 9.5 9.1   GFR: Estimated Creatinine Clearance: 16.9  mL/min (A) (by C-G formula based on SCr of 1.51 mg/dL (H)). Liver Function Tests: Recent Labs  Lab 03/30/20 1328  AST 23  ALT 20  ALKPHOS 69  BILITOT 0.5  PROT 8.8*  ALBUMIN 3.4*   No results for input(s): LIPASE, AMYLASE in the last 168 hours. No results for input(s): AMMONIA in the last 168 hours. Coagulation Profile: No results for input(s): INR, PROTIME in the last 168 hours. Cardiac Enzymes: Recent Labs  Lab 03/30/20 1328  CKTOTAL 123   BNP (last 3 results) No results for input(s): PROBNP in the last 8760 hours. HbA1C: No results for input(s): HGBA1C in the last 72 hours. CBG: No results for input(s): GLUCAP in the last 168 hours. Lipid Profile: No results for input(s): CHOL, HDL, LDLCALC, TRIG, CHOLHDL, LDLDIRECT in the last 72 hours. Thyroid Function Tests: No results for input(s):  TSH, T4TOTAL, FREET4, T3FREE, THYROIDAB in the last 72 hours. Anemia Panel: No results for input(s): VITAMINB12, FOLATE, FERRITIN, TIBC, IRON, RETICCTPCT in the last 72 hours. Sepsis Labs: No results for input(s): PROCALCITON, LATICACIDVEN in the last 168 hours.  Recent Results (from the past 240 hour(s))  Aerobic/Anaerobic Culture (surgical/deep wound)     Status: None (Preliminary result)   Collection Time: 03/30/20  1:01 AM   Specimen: Wound  Result Value Ref Range Status   Specimen Description   Final    WOUND Performed at [facility], [address], [place], [place] [address]    Special Requests   Final    NONE Performed at [facility], [address] [place], [place] [address]    Gram Stain   Final    NO WBC SEEN ABUNDANT GRAM POSITIVE COCCI FEW GRAM POSITIVE RODS Performed at [facility], [place] [address], [place], [place] [address]    Culture PENDING  Incomplete   Report Status PENDING  Incomplete  Respiratory Panel by RT PCR (Flu A&B, Covid) - Nasopharyngeal Swab     Status: None   Collection Time: 03/30/20  4:35 PM   Specimen: Nasopharyngeal Swab  Result Value Ref Range Status   SARS Coronavirus 2 by RT PCR NEGATIVE NEGATIVE Final    Comment: (NOTE) SARS-CoV-2 target nucleic acids are NOT DETECTED.  The SARS-CoV-2 RNA is generally detectable in upper respiratoy specimens during the acute phase of infection. The lowest concentration of SARS-CoV-2 viral copies this assay can detect is 131 copies/mL. A negative result does not preclude SARS-Cov-2 infection and should not be used as the sole basis for treatment or other patient management decisions. A negative result may occur with  improper specimen collection/handling, submission of specimen other than nasopharyngeal swab, presence of viral mutation(s) within the areas targeted by this assay, and inadequate number of viral copies (<131 copies/mL). A negative result must be combined with  clinical observations, patient history, and epidemiological information. The expected result is Negative.  Fact Sheet for Patients:  PinkCheek.be  Fact Sheet for Healthcare Providers:  GravelBags.it  This test is no t yet approved or cleared by the [place] FDA and  has been authorized for detection and/or diagnosis of SARS-CoV-2 by FDA under an Emergency Use Authorization (EUA). This EUA will remain  in effect (meaning this test can be used) for the duration of the COVID-19 declaration under Section 564(b)(1) of the Act, 21 U.S.C. section 360bbb-3(b)(1), unless the authorization is terminated or revoked sooner.     Influenza A by PCR NEGATIVE NEGATIVE Final   Influenza B by PCR NEGATIVE  NEGATIVE Final    Comment: (NOTE) The Xpert Xpress SARS-CoV-2/FLU/RSV assay is intended as an aid in  the diagnosis of influenza from Nasopharyngeal swab specimens and  should not be used as a sole basis for treatment. Nasal washings and  aspirates are unacceptable for Xpert Xpress SARS-CoV-2/FLU/RSV  testing.  Fact Sheet for Patients: PinkCheek.be  Fact Sheet for Healthcare Providers: GravelBags.it  This test is not yet approved or cleared by the [place] FDA and  has been authorized for detection and/or diagnosis of SARS-CoV-2 by  FDA under an Emergency Use Authorization (EUA). This EUA will remain  in effect (meaning this test can be used) for the duration of the  Covid-19 declaration under Section 564(b)(1) of the Act, 21  U.S.C. section 360bbb-3(b)(1), unless the authorization is  terminated or revoked. Performed at [facility], [address] [place], [place] [address]   Blood culture (routine x 2)     Status: None (Preliminary result)   Collection Time: 03/30/20  6:52 PM   Specimen: BLOOD  Result Value Ref Range Status   Specimen Description  BLOOD RIGHT ANTECUBITAL  Final   Special Requests   Final    BOTTLES DRAWN AEROBIC AND ANAEROBIC Blood Culture adequate volume   Culture   Final    NO GROWTH < 12 HOURS Performed at [facility], [address], [place], [place] [address]    Report Status PENDING  Incomplete  Blood culture (routine x 2)     Status: None (Preliminary result)   Collection Time: 03/30/20  8:23 PM   Specimen: BLOOD  Result Value Ref Range Status   Specimen Description BLOOD LEFT ANTECUBITAL  Final   Special Requests   Final    BOTTLES DRAWN AEROBIC AND ANAEROBIC Blood Culture adequate volume   Culture   Final    NO GROWTH < 12 HOURS Performed at [facility], [address] [place], [place] [address]    Report Status PENDING  Incomplete         Radiology Studies: DG Ankle Complete Left  Result Date: 03/30/2020 CLINICAL DATA:  Left heel wound and swelling. EXAM: LEFT ANKLE COMPLETE - 3+ VIEW COMPARISON:  None. FINDINGS: Large soft tissue defect noted on the plantar aspect of the midfoot. No wound is identified over the calcaneus. There is diffuse soft tissue swelling/edema suggesting cellulitis. No definite destructive bony changes to suggest osteomyelitis. I do not see any findings suspicious for septic arthritis. Extensive vascular calcifications. IMPRESSION: 1. Large soft tissue defect on the plantar aspect of the midfoot. 2. Diffuse soft tissue swelling/edema suggesting cellulitis. 3. No definite plain film findings for septic arthritis or osteomyelitis. Electronically Signed   By: Marijo Sanes M.D.   On: 03/30/2020 17:10        Scheduled Meds: . amLODipine  5 mg Oral Daily  . gabapentin  300 mg Oral TID  . heparin  5,000 Units Subcutaneous Q8H  . metoprolol tartrate  25 mg Oral BID  . sodium chloride flush  3 mL Intravenous Q12H  . sodium chloride flush  3 mL Intravenous Q12H   Continuous Infusions: . sodium chloride    . piperacillin-tazobactam (ZOSYN)  IV        LOS: 1 day    Time spent: 35 mins     Wyvonnia Dusky, MD [facility] Pager [phone]  If 7PM-7AM, please contact night-coverage 03/31/2020, 7:51 AM

## 2020-04-01 DIAGNOSIS — Z7189 Other specified counseling: Secondary | ICD-10-CM

## 2020-04-01 DIAGNOSIS — E43 Unspecified severe protein-calorie malnutrition: Secondary | ICD-10-CM | POA: Insufficient documentation

## 2020-04-01 DIAGNOSIS — Z515 Encounter for palliative care: Secondary | ICD-10-CM

## 2020-04-01 LAB — BASIC METABOLIC PANEL
Anion gap: 13 (ref 5–15)
BUN: 25 mg/dL — ABNORMAL HIGH (ref 8–23)
CO2: 26 mmol/L (ref 22–32)
Calcium: 9.3 mg/dL (ref 8.9–10.3)
Chloride: 101 mmol/L (ref 98–111)
Creatinine, Ser: 1.34 mg/dL — ABNORMAL HIGH (ref 0.44–1.00)
GFR, Estimated: 36 mL/min — ABNORMAL LOW (ref >60)
Glucose, Bld: 110 mg/dL — ABNORMAL HIGH (ref 70–99)
Potassium: 3.5 mmol/L (ref 3.5–5.1)
Sodium: 140 mmol/L (ref 135–145)

## 2020-04-01 LAB — URINE CULTURE

## 2020-04-01 LAB — CBC
HCT: 34.7 % — ABNORMAL LOW (ref 36.0–46.0)
Hemoglobin: 11.5 g/dL — ABNORMAL LOW (ref 12.0–15.0)
MCH: 26.2 pg (ref 26.0–34.0)
MCHC: 33.1 g/dL (ref 30.0–36.0)
MCV: 79 fL — ABNORMAL LOW (ref 80.0–100.0)
Platelets: 378 10*3/uL (ref 150–400)
RBC: 4.39 MIL/uL (ref 3.87–5.11)
RDW: 15 % (ref 11.5–15.5)
WBC: 9.2 10*3/uL (ref 4.0–10.5)
nRBC: 0.2 % (ref 0.0–0.2)

## 2020-04-01 LAB — PROCALCITONIN: Procalcitonin: <0.1 ng/mL

## 2020-04-01 MED ORDER — MORPHINE SULFATE (CONCENTRATE) 10 MG/0.5ML PO SOLN
2.0000 mg | ORAL | Status: DC | PRN
  Administered 2020-04-01 – 2020-04-02 (×3): 4 mg via ORAL
  Filled 2020-04-01 (×3): qty 0.5

## 2020-04-01 MED ORDER — ENSURE ENLIVE PO LIQD
237.0000 mL | Freq: Two times a day (BID) | ORAL | Status: DC
  Administered 2020-04-01: 237 mL via ORAL

## 2020-04-01 MED ORDER — MORPHINE SULFATE (CONCENTRATE) 10 MG/0.5ML PO SOLN: 2.0000 mg | ORAL | Status: DC | PRN

## 2020-04-01 NOTE — Progress Notes (Signed)
   04/01/20 1030  Clinical Encounter Type  Visited With Patient and family together  Visit Type Initial;Spiritual support;Social support  Referral From Nurse  Consult/Referral To Chaplain  Ch went to the room to finish up AD. With the help of nurse Inez Catalina everything worked out. The AD was finished, and Ch will follow-up later.

## 2020-04-01 NOTE — Progress Notes (Addendum)
Daily Progress Note   Patient Name: [name]       Date: 04/01/2020 DOB: Oct 01, 1920  Age: 86 y.o. MRN#: [id number] Attending Physician: Wyvonnia Dusky, MD Primary Care Physician: Jodi Marble, MD Admit Date: 03/30/2020  Reason for Consultation/Follow-up: Establishing goals of care  Subjective: Patient is sitting in bed.  Her son and daughter-in-law are at bedside.  She answers my questions appropriately. We discussed her diagnosis, prognosis, GOC, EOL wishes disposition and options.  Created space and opportunity for patient  to explore thoughts and feelings regarding current medical information. She states that she wants to go home to live with her son and daughter-in-law who are at bedside at this time.    A detailed discussion was had today regarding advanced directives.  Concepts specific to code status, artifical feeding and hydration, IV antibiotics and rehospitalization were discussed.  The difference between an aggressive medical intervention path and a comfort care path was discussed.  Values and goals of care important to patient and family were attempted to be elicited.  Discussed limitations of medical interventions to prolong quality of life in some situations and discussed the concept of human mortality.  She discusses the amputation that she has had previously.  She discusses the current situation.  She states that she understands if she does not have her lower extremity amputated that she will die.  She states that she just wants to focus on comfort for what time she has left.  She states she does not want to continue life-prolonging measures.  Educated them on natural trajectory of illness, and expectations at EOL were discussed.  Questions and concerns  addressed.     Chaplain and notary and to bedside to complete H POA papers for patient's son who is present.  I completed a MOST form today and the signed original was placed in the chart. A photocopy was placed in the chart to be scanned into EMR. The patient outlined their wishes for the following treatment decisions:  Cardiopulmonary Resuscitation: Do Not Attempt Resuscitation (DNR/No CPR)  Medical Interventions: Comfort Measures: Keep clean, warm, and dry. Use medication by any route, positioning, wound care, and other measures to relieve pain and suffering. Use oxygen, suction and manual treatment of airway obstruction as needed for comfort. Do not transfer to the hospital unless  comfort needs cannot be met in current location.  Antibiotics: No antibiotics (use other measures to relieve symptoms)  IV Fluids: No IV fluids (provide other measures to ensure comfort)  Feeding Tube: No feeding tube    Length of Stay: 2  Current Medications: Scheduled Meds:  . amLODipine  5 mg Oral Daily  . gabapentin  300 mg Oral TID  . heparin  5,000 Units Subcutaneous Q8H  . metoprolol tartrate  25 mg Oral BID  . sertraline  25 mg Oral Daily    Continuous Infusions: . DAPTOmycin (CUBICIN)  IV Stopped (03/31/20 1540)    PRN Meds: morphine CONCENTRATE, oxyCODONE-acetaminophen  Physical Exam Pulmonary:     Effort: Pulmonary effort is normal.  Neurological:     Mental Status: She is alert.             Vital Signs: BP 137/66 (BP Location: Left Arm)   Pulse 82   Temp 98 F (36.7 C) (Oral)   Resp 18   Ht 5' (1.524 m)   Wt 57.6 kg   SpO2 97%   BMI 24.80 kg/m  SpO2: SpO2: 97 % O2 Device: O2 Device: Room Air O2 Flow Rate:    Intake/output summary:   Intake/Output Summary (Last 24 hours) at 04/01/2020 1333 Last data filed at 04/01/2020 0600 Gross per 24 hour  Intake 746.59 ml  Output --  Net 746.59 ml   LBM: Last BM Date:  (pta) Baseline Weight: Weight: 57.6 kg Most recent  weight: Weight: 57.6 kg       Palliative Assessment/Data:      Patient Active Problem List   Diagnosis Date Noted  . Foot ulcer, left ([place]) 03/30/2020  . Abscess of leg, right 12/18/2019  . Gangrene of right foot ([place]) 11/05/2019  . Gangrene due to peripheral vascular disease ([place]) 11/05/2019  . Atherosclerosis of native arteries of the extremities with ulceration ([place]) 10/17/2019  . Bedbug bite 04/22/2019  . Hypotension 04/22/2019  . Gout 04/22/2019  . AKI (acute kidney injury) ([place]) 04/22/2019  . Cognitive impairment 04/22/2019  . Anemia 04/21/2019  . HTN (hypertension) 08/02/2016  . UGIB (upper gastrointestinal bleed) 08/02/2016  . Acute blood loss anemia 08/02/2016  . Climacteric arthritis, lower leg 03/22/2016  . Depression 03/22/2016  . Esophageal reflux 03/22/2016  . Osteoarthritis of knee 09/07/2015  . Closed fracture of second cervical vertebra ([place]) 09/01/2015  . Abdominal pain 03/17/2015  . CKD (chronic kidney disease) stage 4, GFR 15-29 ml/min (HCC) 04/28/2014    Palliative Care Assessment & Plan    Recommendations/Plan:  Home with hospice  Code Status:    Code Status Orders  (From admission, onward)         Start     Ordered   03/30/20 1836  Full code  Continuous        03/30/20 1836        Code Status History    Date Active Date Inactive Code Status Order ID Comments User Context   12/18/2019 1941 12/19/2019 2310 Full Code [id number]  Katha Cabal, MD ED   11/05/2019 0832 11/11/2019 0602 Full Code [id number]  Collier Bullock, MD ED   10/20/2019 1529 10/20/2019 2131 Full Code [id number]  Algernon Huxley, MD Inpatient   04/21/2019 2326 04/26/2019 1900 Full Code [id number]  Orene Desanctis, DO ED   08/02/2016 0035 08/03/2016 1918 Full Code [id number]  Etta Quill, DO Inpatient   03/17/2015 2056 03/19/2015 1405 Full Code [id number]  Christene Lye, MD Inpatient  Advance Care Planning Activity       Prognosis:   < 6 months    Care plan was  discussed with SW and primary team  Thank you for allowing the Palliative Medicine Team to assist in the care of this patient.   Total Time 35 min Prolonged Time Billed  no       Greater than 50%  of this time was spent counseling and coordinating care related to the above assessment and plan.  Asencion Gowda, NP  Please contact Palliative Medicine Team phone at [phone] for questions and concerns.

## 2020-04-01 NOTE — TOC Initial Note (Addendum)
Transition of Care [facility]) - Initial/Assessment Note    Patient Details  Name: [name] MRN: [id number] Date of Birth: 1920/09/06  [facility]) CM/SW Contact:    Candie Chroman, LCSW Phone Number: 04/01/2020, 12:48 PM  Clinical Narrative: Patient only oriented to self. Son and daughter-in-law at bedside. CSW introduced role and explained that, per palliative NP, they were interested in home with hospice services through [facility]. Patient's son and daughter confirmed. They want [facility] in case she needs to go to the hospice house. Patient has a hospital bed, wheelchair, and bedside commode at home through [facility]. Made referral to [facility] liaison, Flo Shanks, RN. She said they will take over the contract with [employer] and she will be able to keep her current DME. Patient previously had a hoyer lift and side table but [facility] took them back when they determined she no longer met home hospice criteria. Patient started hospice services with them after she left a SNF on September 29. Santiago Glad stated the admissions nurse would order that equipment if needed once she got home. CSW confirmed with son that address on facesheet is correct. No further concerns. CSW encouraged patient's son and daughter-in-law to contact CSW as needed. CSW will continue to follow patient and her family for support and facilitate return home with hospice when medically stable.           3:49 pm: [facility] liaison spoke with MD and confirmed plan for discharge home tomorrow. CSW called son to notify. Also notified [facility].        Expected Discharge Plan: Home w Hospice Care Barriers to Discharge: Continued Medical Work up   Patient Goals and CMS Choice Patient states their goals for this hospitalization and ongoing recovery are:: Patient not fully oriented.   Choice offered to / list presented to : Adult Children  Expected Discharge Plan and Services Expected  Discharge Plan: [place] Acute Care Choice: Durable Medical Equipment, Hospice Living arrangements for the past 2 months: Single Family Home                                      Prior Living Arrangements/Services Living arrangements for the past 2 months: Single Family Home Lives with:: Adult Children Patient language and need for interpreter reviewed:: Yes Do you feel safe going back to the place where you live?: Yes      Need for Family Participation in Patient Care: Yes (Comment) Care giver support system in place?: Yes (comment) Current home services: DME, Home PT, Home RN Criminal Activity/Legal Involvement Pertinent to Current Situation/Hospitalization: No - Comment as needed  Activities of Daily Living Home Assistive Devices/Equipment: None ADL Screening (condition at time of admission) Patient's cognitive ability adequate to safely complete daily activities?: Yes Is the patient deaf or have difficulty hearing?: Yes Does the patient have difficulty seeing, even when wearing glasses/contacts?: Yes Does the patient have difficulty concentrating, remembering, or making decisions?: Yes Patient able to express need for assistance with ADLs?: Yes Does the patient have difficulty dressing or bathing?: Yes Independently performs ADLs?: No Communication: Independent Dressing (OT): Dependent Is this a change from baseline?: Pre-admission baseline Grooming: Dependent Is this a change from baseline?: Pre-admission baseline Feeding: Needs assistance Is this a change from baseline?: Pre-admission baseline Bathing: Dependent Is this a change from baseline?: Pre-admission baseline Toileting:  Dependent Is this a change from baseline?: Pre-admission baseline In/Out Bed: Dependent Is this a change from baseline?: Pre-admission baseline Walks in Home: Dependent Is this a change from baseline?: Pre-admission baseline Does the patient have difficulty walking  or climbing stairs?: Yes Weakness of Legs: Both (aka) Weakness of Arms/Hands: Both  Permission Sought/Granted Permission sought to share information with : Facility Sport and exercise psychologist, Family Supports    Share Information with NAME: Taiylor Virden  Permission granted to share info w AGENCY: [facility]  Permission granted to share info w Relationship: Son  Permission granted to share info w Contact Information: [phone]  Emotional Assessment Appearance:: Appears stated age Attitude/Demeanor/Rapport: Unable to Assess Affect (typically observed): Unable to Assess Orientation: : Oriented to Self Alcohol / Substance Use: Not Applicable Psych Involvement: No (comment)  Admission diagnosis:  Foot ulcer, left ([place]) [L97.529] Cellulitis of left lower extremity [L03.116] AKI (acute kidney injury) ([place]) [N17.9] Patient Active Problem List   Diagnosis Date Noted  . Foot ulcer, left ([place]) 03/30/2020  . Abscess of leg, right 12/18/2019  . Gangrene of right foot ([place]) 11/05/2019  . Gangrene due to peripheral vascular disease ([place]) 11/05/2019  . Atherosclerosis of native arteries of the extremities with ulceration ([place]) 10/17/2019  . Bedbug bite 04/22/2019  . Hypotension 04/22/2019  . Gout 04/22/2019  . AKI (acute kidney injury) ([place]) 04/22/2019  . Cognitive impairment 04/22/2019  . Anemia 04/21/2019  . HTN (hypertension) 08/02/2016  . UGIB (upper gastrointestinal bleed) 08/02/2016  . Acute blood loss anemia 08/02/2016  . Climacteric arthritis, lower leg 03/22/2016  . Depression 03/22/2016  . Esophageal reflux 03/22/2016  . Osteoarthritis of knee 09/07/2015  . Closed fracture of second cervical vertebra ([place]) 09/01/2015  . Abdominal pain 03/17/2015  . CKD (chronic kidney disease) stage 4, GFR 15-29 ml/min (HCC) 04/28/2014   PCP:  Jodi Marble, MD Pharmacy:   [id number] - [place], [place] [address] Phone: [phone]  Fax: [place], [place] - [place] [address] Phone: [phone] Fax: [phone]  [place], [place] [place] [address] Phone: [phone] Fax: [phone]     Social Determinants of Health (SDOH) Interventions    Readmission Risk Interventions Readmission Risk Prevention Plan 04/24/2019  Medication Screening Complete  Transportation Screening Complete  Some recent data might be hidden

## 2020-04-01 NOTE — Progress Notes (Signed)
PIV consult: Arrived to room, pt refusing IV start. Attempted redirected and consoling with assistance from bedside RN. "Pt stated repeatedly, I can't let you stick that needle in my arm." Jerking arm away and pulling blankets over her. Pt repositioned in bed. Will re-attempt IV start another time.

## 2020-04-01 NOTE — Progress Notes (Signed)
Initial Nutrition Assessment  DOCUMENTATION CODES:   Severe malnutrition in context of social or environmental circumstances  INTERVENTION:   Ensure Enlive po BID, each supplement provides 350 kcal and 20 grams of protein  Magic cup TID with meals, each supplement provides 290 kcal and 9 grams of protein  NUTRITION DIAGNOSIS:   Severe Malnutrition related to social / environmental circumstances (dementia, advanced age) as evidenced by severe fat depletion, severe muscle depletion.  GOAL:   Patient will meet greater than or equal to 90% of their needs  MONITOR:   Supplement acceptance, PO intake, Labs, Weight trends, Skin, I & O's  REASON FOR ASSESSMENT:   Malnutrition Screening Tool    ASSESSMENT:   86 y.o. female with past medical history of arthritis, dementia, CKD IV, GERD, HTN, and PVD status post right AKA who presents for assessment accompanied by son of persistent pain at the site of an ulcer on the left ankle  Met with pt in room today. Pt is a poor historian but is able to tell me that her appetite is good and that she ate breakfast. Pt's breakfast tray was sitting on her side table and was about 80% eaten; it appears patient ate a pancake and a banana. Pt had not yet touched her lunch tray but it appeared that tray had just not been set up for her yet. Pt reports that she does enjoy Ensure supplements. Palliative care met with pt today; pt has decided to focus mainly on comfort. RD will add Ensure supplements and ice cream to meal trays as pt enjoys this.   Per chart, pt appears fairly weight stable at baseline.    Medications reviewed and include: daptomycin, morphine   Labs reviewed: BUN 25(H), creat 1.34(H)  NUTRITION - FOCUSED PHYSICAL EXAM:    Most Recent Value  Orbital Region Moderate depletion  Upper Arm Region Severe depletion  Thoracic and Lumbar Region Severe depletion  Buccal Region Moderate depletion  Temple Region Moderate depletion  Clavicle  Bone Region Severe depletion  Clavicle and Acromion Bone Region Severe depletion  Scapular Bone Region Severe depletion  Dorsal Hand Severe depletion  Patellar Region Severe depletion  Anterior Thigh Region Severe depletion  Posterior Calf Region Severe depletion  Edema (RD Assessment) None  Hair Reviewed  Eyes Reviewed  Mouth Reviewed  Skin Reviewed  Nails Reviewed     Diet Order:   Diet Order            DIET DYS 3 Room service appropriate? Yes; Fluid consistency: Thin  Diet effective now                EDUCATION NEEDS:   Not appropriate for education at this time  Skin:  Skin Assessment: Reviewed RN Assessment (Stage II sacrum, wounds L heel and toe)  Last BM:  pta  Height:   Ht Readings from Last 1 Encounters:  03/30/20 5' (1.524 m)    Weight:   Wt Readings from Last 1 Encounters:  03/31/20 57.6 kg    Ideal Body Weight:  45.4 kg  BMI:  Body mass index is 24.8 kg/m.  Estimated Nutritional Needs:   Kcal:  1200-1400kcal/day  Protein:  60-70g/day  Fluid:  >1.2L/day  Koleen Distance MS, RD, LDN Please refer to [facility] for RD and/or RD on-call/weekend/after hours pager

## 2020-04-01 NOTE — Progress Notes (Signed)
[facility] Liaison note:  New referral for [facility] hospice services at home received from [facility]. Patient information sent to referral. Hospice eligibility pending. Writer spoke via telephone to patient's son Jeneen Rinks and daughter in law Pam to initiate  education regarding hospice services, philosophy and team approach to care with understanding voiced. Pain medication discussed, plan si for patient to continue on liquid morphine at home. Attending Dr. Jimmye Norman made aware. DME needs discussed, no DME needed at time of discharge. Patient will required non emergent transport home. TOC [place] Scrape aware. Will continue to follow through discharge. Thank you for the opportunity to be involved in the care of this patient and her family.  Flo Shanks BSN, RN, [place] [phone]

## 2020-04-01 NOTE — Progress Notes (Addendum)
PROGRESS NOTE    [name]  NWG:[id number] DOB: [birth year] DOA: 03/30/2020 PCP: Jodi Marble, MD   Assessment & Plan:   Principal Problem:   Foot ulcer, left ([place]) Active Problems:   HTN (hypertension)   CKD (chronic kidney disease) stage 4, GFR 15-29 ml/min (HCC)   Anemia  Left foot ulcer: continue on IV dapto. Podiatry & vascular surg following. Hx of PVD & no way to cure the ulcer except for above the knee amputation as per vascular surg which would likely not improve quality or quantity of life. Wound care consulted. Morphine, percocet prn for pain. Palliative care following and recs apprec   Bacteremia: growing MRSA, sens pending. Continue on IV daptomycin as per ID.   Dementia: continue w/ supportive care   HTN: continue on home dose of amlodipine, metoprolol   CKDIV: Cr continues to trend down daily. Avoid nephrotoxic meds   ACD: no need for a transfusion currently. Will continue to monitor    DVT prophylaxis: heparin  Code Status: DNR Family Communication: discussed pt's care w/ pt's son, Jeneen Rinks & his wife, & answered their questions  Disposition Plan: hospice vs home vs SNF    Status is: Inpatient  Remains inpatient appropriate because:Ongoing diagnostic testing needed not appropriate for outpatient work up, Unsafe d/c plan and IV treatments appropriate due to intensity of illness or inability to take PO   Dispo: The patient is from: Home              Anticipated d/c is to: Home vs SNF vs hospice               Anticipated d/c date is: 3 days              Patient currently is not medically stable to d/c.     Consultants:   ID  Podiatry  Vascular surg   Palliative care    Procedures:    Antimicrobials: dapto   Subjective: Pt is oriented to self only   Objective: Vitals:   03/31/20 1539 03/31/20 2011 03/31/20 2320 04/01/20 0447  BP: 129/66 (!) 110/94 121/75 (!) 156/73  Pulse: (!) 59 65 64 71  Resp: 16 18 15 18   Temp: (!) 97.5 F  (36.4 C) 98 F (36.7 C) 98 F (36.7 C)   TempSrc: Oral  Oral   SpO2: 98% 99% 98%   Weight:      Height:        Intake/Output Summary (Last 24 hours) at 04/01/2020 0749 Last data filed at 04/01/2020 0600 Gross per 24 hour  Intake 759.04 ml  Output --  Net 759.04 ml   Filed Weights   03/30/20 1326 03/31/20 0500  Weight: 57.6 kg 57.6 kg    Examination:  General exam: Appears calm but uncomfortable Respiratory system: clear breath sounds b/l. No rales.  Cardiovascular system: S1 & S2 +. No rubs or gallops  Gastrointestinal system: Abdomen is nondistended, soft and nontender. Hypoactive bowel sounds  Central nervous system: Oriented to self only. Moves all extremities  Psychiatry: Judgement and insight appear abnormal. Flat mood and affect    Data Reviewed: I have personally reviewed following labs and imaging studies  CBC: Recent Labs  Lab 03/30/20 1328 03/31/20 0708 04/01/20 0537  WBC 10.5 9.5 9.2  HGB 10.8* 10.0* 11.5*  HCT 33.9* 30.1* 34.7*  MCV 80.9 80.1 79.0*  PLT 332 323 578   Basic Metabolic Panel: Recent Labs  Lab 03/30/20 1328 03/31/20 0708 04/01/20 0537  NA 144 141 140  K 4.2 4.1 3.5  CL 104 104 101  CO2 25 27 26   GLUCOSE 100* 95 110*  BUN 41* 35* 25*  CREATININE 1.77* 1.51* 1.34*  CALCIUM 9.5 9.1 9.3   GFR: Estimated Creatinine Clearance: 19.1 mL/min (A) (by C-G formula based on SCr of 1.34 mg/dL (H)). Liver Function Tests: Recent Labs  Lab 03/30/20 1328  AST 23  ALT 20  ALKPHOS 69  BILITOT 0.5  PROT 8.8*  ALBUMIN 3.4*   No results for input(s): LIPASE, AMYLASE in the last 168 hours. No results for input(s): AMMONIA in the last 168 hours. Coagulation Profile: No results for input(s): INR, PROTIME in the last 168 hours. Cardiac Enzymes: Recent Labs  Lab 03/30/20 1328 03/31/20 0708  CKTOTAL 123 159   BNP (last 3 results) No results for input(s): PROBNP in the last 8760 hours. HbA1C: No results for input(s): HGBA1C in the  last 72 hours. CBG: No results for input(s): GLUCAP in the last 168 hours. Lipid Profile: No results for input(s): CHOL, HDL, LDLCALC, TRIG, CHOLHDL, LDLDIRECT in the last 72 hours. Thyroid Function Tests: No results for input(s): TSH, T4TOTAL, FREET4, T3FREE, THYROIDAB in the last 72 hours. Anemia Panel: No results for input(s): VITAMINB12, FOLATE, FERRITIN, TIBC, IRON, RETICCTPCT in the last 72 hours. Sepsis Labs: Recent Labs  Lab 03/31/20 0708 04/01/20 0537  PROCALCITON <0.10 <0.10    Recent Results (from the past 240 hour(s))  Aerobic/Anaerobic Culture (surgical/deep wound)     Status: None (Preliminary result)   Collection Time: 03/30/20  1:01 AM   Specimen: Wound  Result Value Ref Range Status   Specimen Description   Final    WOUND Performed at [facility], [address] [place], [place] [address]    Special Requests   Final    NONE Performed at [facility], [address] [place], [place] [address]    Gram Stain   Final    NO WBC SEEN ABUNDANT GRAM POSITIVE COCCI FEW GRAM POSITIVE RODS Performed at [facility], [place] [address], [place], [place] [address]    Culture PENDING  Incomplete   Report Status PENDING  Incomplete  Respiratory Panel by RT PCR (Flu A&B, Covid) - Nasopharyngeal Swab     Status: None   Collection Time: 03/30/20  4:35 PM   Specimen: Nasopharyngeal Swab  Result Value Ref Range Status   SARS Coronavirus 2 by RT PCR NEGATIVE NEGATIVE Final    Comment: (NOTE) SARS-CoV-2 target nucleic acids are NOT DETECTED.  The SARS-CoV-2 RNA is generally detectable in upper respiratoy specimens during the acute phase of infection. The lowest concentration of SARS-CoV-2 viral copies this assay can detect is 131 copies/mL. A negative result does not preclude SARS-Cov-2 infection and should not be used as the sole basis for treatment or other patient management decisions. A negative result may occur with  improper specimen  collection/handling, submission of specimen other than nasopharyngeal swab, presence of viral mutation(s) within the areas targeted by this assay, and inadequate number of viral copies (<131 copies/mL). A negative result must be combined with clinical observations, patient history, and epidemiological information. The expected result is Negative.  Fact Sheet for Patients:  PinkCheek.be  Fact Sheet for Healthcare Providers:  GravelBags.it  This test is no t yet approved or cleared by the [place] FDA and  has been authorized for detection and/or diagnosis of SARS-CoV-2 by FDA under an Emergency Use Authorization (EUA). This EUA will remain  in effect (meaning this test can be used) for the duration of the COVID-19 declaration under Section 564(b)(1) of the Act, 21 U.S.C. section 360bbb-3(b)(1), unless the authorization is terminated or revoked sooner.     Influenza A by PCR NEGATIVE NEGATIVE Final   Influenza B by PCR NEGATIVE NEGATIVE Final    Comment: (NOTE) The Xpert Xpress SARS-CoV-2/FLU/RSV assay is intended as an aid in  the diagnosis of influenza from Nasopharyngeal swab specimens and  should not be used as a sole basis for treatment. Nasal washings and  aspirates are unacceptable for Xpert Xpress SARS-CoV-2/FLU/RSV  testing.  Fact Sheet for Patients: PinkCheek.be  Fact Sheet for Healthcare Providers: GravelBags.it  This test is not yet approved or cleared by the [place] FDA and  has been authorized for detection and/or diagnosis of SARS-CoV-2 by  FDA under an Emergency Use Authorization (EUA). This EUA will remain  in effect (meaning this test can be used) for the duration of the  Covid-19 declaration under Section 564(b)(1) of the Act, 21  U.S.C. section 360bbb-3(b)(1), unless the authorization is  terminated or revoked. Performed at [facility], [address] [place], [place] [address]   Blood culture (routine x 2)     Status: Abnormal (Preliminary result)   Collection Time: 03/30/20  6:52 PM   Specimen: BLOOD  Result Value Ref Range Status   Specimen Description   Final    BLOOD RIGHT ANTECUBITAL Performed at [facility], [address], [place], [place] [address]    Special Requests   Final    BOTTLES DRAWN AEROBIC AND ANAEROBIC Blood Culture adequate volume Performed at [facility], [address] [place], [place] [address]    Culture  Setup Time   Final    Organism ID to follow [place] AND ANAEROBIC BOTTLES CRITICAL RESULT CALLED TO, READ BACK BY AND VERIFIED WITH: Awanda Mink 1010 03/31/2020 DB Performed at [facility], [address] [place], [place] [address]    Culture STAPHYLOCOCCUS AUREUS (A)  Final   Report Status PENDING  Incomplete  Blood Culture ID Panel (Reflexed)     Status: Abnormal   Collection Time: 03/30/20  6:52 PM  Result Value Ref Range Status   Enterococcus faecalis NOT DETECTED NOT DETECTED Final   Enterococcus Faecium NOT DETECTED NOT DETECTED Final   Listeria monocytogenes NOT DETECTED NOT DETECTED Final   Staphylococcus species DETECTED (A) NOT DETECTED Final    Comment: CRITICAL RESULT CALLED TO, READ BACK BY AND VERIFIED WITH: MYRA SLAUGHTER. PHAR 1010 03/31/2020 DB    Staphylococcus aureus (BCID) DETECTED (A) NOT DETECTED Final    Comment: Methicillin (oxacillin)-resistant Staphylococcus aureus (MRSA). MRSA is predictably resistant to beta-lactam antibiotics (except ceftaroline). Preferred therapy is vancomycin unless clinically contraindicated. Patient requires contact precautions if  hospitalized. CRITICAL RESULT CALLED TO, READ BACK BY AND VERIFIED WITH: MYRA SLAUGHTER. PHAR 1010 03/31/2020 DB    Staphylococcus epidermidis NOT DETECTED NOT DETECTED Final   Staphylococcus lugdunensis NOT DETECTED  NOT DETECTED Final   Streptococcus species NOT DETECTED NOT DETECTED Final   Streptococcus agalactiae NOT DETECTED NOT DETECTED Final   Streptococcus pneumoniae NOT DETECTED NOT DETECTED Final   Streptococcus pyogenes NOT DETECTED NOT DETECTED Final   A.calcoaceticus-baumannii NOT DETECTED NOT DETECTED Final   Bacteroides fragilis NOT DETECTED NOT DETECTED Final   Enterobacterales NOT DETECTED NOT DETECTED Final   Enterobacter cloacae complex NOT DETECTED NOT DETECTED Final   Escherichia coli NOT DETECTED  NOT DETECTED Final   Klebsiella aerogenes NOT DETECTED NOT DETECTED Final   Klebsiella oxytoca NOT DETECTED NOT DETECTED Final   Klebsiella pneumoniae NOT DETECTED NOT DETECTED Final   Proteus species NOT DETECTED NOT DETECTED Final   Salmonella species NOT DETECTED NOT DETECTED Final   Serratia marcescens NOT DETECTED NOT DETECTED Final   Haemophilus influenzae NOT DETECTED NOT DETECTED Final   Neisseria meningitidis NOT DETECTED NOT DETECTED Final   Pseudomonas aeruginosa NOT DETECTED NOT DETECTED Final   Stenotrophomonas maltophilia NOT DETECTED NOT DETECTED Final   Candida albicans NOT DETECTED NOT DETECTED Final   Candida auris NOT DETECTED NOT DETECTED Final   Candida glabrata NOT DETECTED NOT DETECTED Final   Candida krusei NOT DETECTED NOT DETECTED Final   Candida parapsilosis NOT DETECTED NOT DETECTED Final   Candida tropicalis NOT DETECTED NOT DETECTED Final   Cryptococcus neoformans/gattii NOT DETECTED NOT DETECTED Final   Meth resistant mecA/C and MREJ DETECTED (A) NOT DETECTED Final    Comment: CRITICAL RESULT CALLED TO, READ BACK BY AND VERIFIED WITH: MYRA SLAUGHTER. PHAR 1010 03/31/2020 DB Performed at [facility], [address] [place], [place] [address]   Blood culture (routine x 2)     Status: None (Preliminary result)   Collection Time: 03/30/20  8:23 PM   Specimen: BLOOD  Result Value Ref Range Status   Specimen Description BLOOD LEFT ANTECUBITAL   Final   Special Requests   Final    BOTTLES DRAWN AEROBIC AND ANAEROBIC Blood Culture adequate volume   Culture   Final    NO GROWTH 2 DAYS Performed at [facility], [address], [place], [place] [address]    Report Status PENDING  Incomplete         Radiology Studies: DG Ankle Complete Left  Result Date: 03/30/2020 CLINICAL DATA:  Left heel wound and swelling. EXAM: LEFT ANKLE COMPLETE - 3+ VIEW COMPARISON:  None. FINDINGS: Large soft tissue defect noted on the plantar aspect of the midfoot. No wound is identified over the calcaneus. There is diffuse soft tissue swelling/edema suggesting cellulitis. No definite destructive bony changes to suggest osteomyelitis. I do not see any findings suspicious for septic arthritis. Extensive vascular calcifications. IMPRESSION: 1. Large soft tissue defect on the plantar aspect of the midfoot. 2. Diffuse soft tissue swelling/edema suggesting cellulitis. 3. No definite plain film findings for septic arthritis or osteomyelitis. Electronically Signed   By: Marijo Sanes M.D.   On: 03/30/2020 17:10        Scheduled Meds:  amLODipine  5 mg Oral Daily   gabapentin  300 mg Oral TID   heparin  5,000 Units Subcutaneous Q8H   metoprolol tartrate  25 mg Oral BID   sertraline  25 mg Oral Daily   sodium chloride flush  3 mL Intravenous Q12H   sodium chloride flush  3 mL Intravenous Q12H   Continuous Infusions:  sodium chloride     DAPTOmycin (CUBICIN)  IV Stopped (03/31/20 1540)     LOS: 2 days    Time spent: 38 mins     Wyvonnia Dusky, MD [facility] Pager [phone]  If 7PM-7AM, please contact night-coverage 04/01/2020, 7:49 AM

## 2020-04-02 LAB — CULTURE, BLOOD (ROUTINE X 2): Special Requests: ADEQUATE

## 2020-04-02 MED ORDER — MORPHINE SULFATE (CONCENTRATE) 10 MG/0.5ML PO SOLN: 5.0000 mg | ORAL | 0 refills | Status: AC | PRN

## 2020-04-02 MED ORDER — MORPHINE SULFATE (CONCENTRATE) 10 MG/0.5ML PO SOLN: 5.0000 mg | ORAL | 0 refills | Status: DC | PRN

## 2020-04-02 NOTE — Care Management Important Message (Signed)
Important Message  Patient Details  Name: TUMEKA CHIMENTI MRN: [id number] Date of Birth: 28-May-1920   Medicare Important Message Given:  N/A - LOS <3 / Initial given by admissions     Dannette Barbara 04/02/2020, 12:36 PM

## 2020-04-02 NOTE — Discharge Summary (Addendum)
Physician Discharge Summary  [name]:[id number] DOB: 03/03/1921 DOA: 03/30/2020  PCP: Jodi Marble, MD  Admit date: 03/30/2020 Discharge date: 04/02/2020  Admitted From: home  Disposition:  Home w/ hospice   Recommendations for Outpatient Follow-up:  1. Follow up with hospice provider in 1-2 days  Home Health: no  Equipment/Devices:  Discharge Condition: home w/ hospice CODE STATUS: DNR  Diet recommendation: Heart Healthy  Brief/Interim Summary: HPI was taken from Dr. Florina Ou: [name] is a 86 y.o. female with medical history significant of HTN, PVD S/P rt AKA , seen in ed for left foot ulcer. Pt was brought by son with c/o pain,pt is alert,awake cooperative but disoriented and thinks she is in [place]. HPI is per edmd note. Pt knows her name and dob but is otherwise confused.  Hospital Course from Dr. Lenise Herald 11/17-11/19/21: Pt presented w/ left foot ulcer that was chronic. Pt was placed on IV abxs. Vascular surg & podiatry saw the pt and there is no way for the ulcer to heal, only option would be an above the knee amputation. The amputation would not improve the quality or quantity of life. Of note, pt was found to MRSA bacteremia and pt was put on IV daptomycin as per ID. Palliative care and hospice saw the pt and pt's family agreed to proceed w/ home hospice. All abxs were d/c prior to d/c. For more information, please see previous progress note.     Discharge Diagnoses:  Principal Problem:   Foot ulcer, left ([place]) Active Problems:   HTN (hypertension)   CKD (chronic kidney disease) stage 4, GFR 15-29 ml/min (HCC)   Anemia   Protein-calorie malnutrition, severe Left foot ulcer: abxs were d/c as pt's family decided to proceed w/ home hospice. Podiatry & vascular surg following. Hx of PVD & no way to cure the ulcer except for above the knee amputation as per vascular surg which would likely not improve quality or quantity of life. Morphine, percocet prn  for pain.Will go home w/ hospice. Palliative care/hospice following   Bacteremia: growing MRSA, sens pending. Abx were d/c.   Severe protein calorie malnutrition: continue on nutritional supplements   Dementia: continue w/ supportive care   HTN: continue on home dose of amlodipine, metoprolol   CKDIV: Cr continues to trend down daily. Avoid nephrotoxic meds   ACD: no need for a transfusion currently. Will continue to monitor    Discharge Instructions  Discharge Instructions    Diet - low sodium heart healthy   Complete by: As directed    Discharge instructions   Complete by: As directed    F/u w/ hospice provider in 1-2 days   Discharge wound care:   Complete by: As directed    As stated above but only as tolerated   Increase activity slowly   Complete by: As directed      Allergies as of 04/02/2020   No Known Allergies     Medication List    STOP taking these medications   allopurinol 300 MG tablet Commonly known as: ZYLOPRIM   amLODipine 5 MG tablet Commonly known as: NORVASC   aspirin EC 81 MG tablet   atorvastatin 10 MG tablet Commonly known as: Lipitor   clopidogrel 75 MG tablet Commonly known as: Plavix   Colcrys 0.6 MG tablet Generic drug: colchicine   feeding supplement Liqd   folic acid 1 MG tablet Commonly known as: FOLVITE   gabapentin 300 MG capsule Commonly known as:  NEURONTIN   hydrALAZINE 50 MG tablet Commonly known as: APRESOLINE   hyoscyamine 0.125 MG SL tablet Commonly known as: LEVSIN SL   pantoprazole 40 MG tablet Commonly known as: PROTONIX   Poly-Iron 150 Forte 150-0.025-1 MG Caps Generic drug: Iron Polysacch [id number]   Senna Laxative 8.6 MG tablet Generic drug: senna     TAKE these medications   metoprolol tartrate 25 MG tablet Commonly known as: LOPRESSOR Take 25 mg by mouth 2 (two) times daily.   morphine CONCENTRATE 10 MG/0.5ML Soln concentrated solution Take 0.25 mLs (5 mg total) by mouth every 3  (three) hours as needed for up to 7 days for moderate pain, severe pain or shortness of breath.   Santyl ointment Generic drug: collagenase Apply 1 application topically daily. What changed: Another medication with the same name was removed. Continue taking this medication, and follow the directions you see here.   sertraline 25 MG tablet Commonly known as: ZOLOFT Take 25 mg by mouth daily with breakfast.            Discharge Care Instructions  (From admission, onward)         Start     Ordered   04/02/20 0000  Discharge wound care:       Comments: As stated above but only as tolerated   04/02/20 1025          No Known Allergies  Consultations:  Vascular surg  podiatary   Palliative care/hospice    Procedures/Studies: DG Ankle Complete Left  Result Date: 03/30/2020 CLINICAL DATA:  Left heel wound and swelling. EXAM: LEFT ANKLE COMPLETE - 3+ VIEW COMPARISON:  None. FINDINGS: Large soft tissue defect noted on the plantar aspect of the midfoot. No wound is identified over the calcaneus. There is diffuse soft tissue swelling/edema suggesting cellulitis. No definite destructive bony changes to suggest osteomyelitis. I do not see any findings suspicious for septic arthritis. Extensive vascular calcifications. IMPRESSION: 1. Large soft tissue defect on the plantar aspect of the midfoot. 2. Diffuse soft tissue swelling/edema suggesting cellulitis. 3. No definite plain film findings for septic arthritis or osteomyelitis. Electronically Signed   By: Marijo Sanes M.D.   On: 03/30/2020 17:10    Subjective: Pt c/o fatigue. Pt denies any pain currently    Discharge Exam: Vitals:   04/02/20 0846 04/02/20 1143  BP: 126/64 (!) 125/57  Pulse: 71 63  Resp: 16 16  Temp: 97.9 F (36.6 C) 98 F (36.7 C)  SpO2: 96% 90%   Vitals:   04/02/20 0030 04/02/20 0636 04/02/20 0846 04/02/20 1143  BP: (!) 142/64 (!) 142/62 126/64 (!) 125/57  Pulse: 73 70 71 63  Resp: 20 18 16 16    Temp: 98 F (36.7 C) 97.6 F (36.4 C) 97.9 F (36.6 C) 98 F (36.7 C)  TempSrc: Oral Oral Oral Oral  SpO2:  96% 96% 90%  Weight:      Height:        General: Pt is alert, awake, not in acute distress. Oriented to self only Cardiovascular:S1/S2 +, no rubs, no gallops Respiratory: CTA bilaterally, no wheezing, no rhonchi Abdominal: Soft, NT, ND, bowel sounds + Extremities: no edema, no cyanosis    The results of significant diagnostics from this hospitalization (including imaging, microbiology, ancillary and laboratory) are listed below for reference.     Microbiology: Recent Results (from the past 240 hour(s))  Aerobic/Anaerobic Culture (surgical/deep wound)     Status: None (Preliminary result)   Collection Time: 03/30/20  1:01  AM   Specimen: Wound  Result Value Ref Range Status   Specimen Description   Final    WOUND Performed at [facility], [address] [place], [place] [address]    Special Requests   Final    NONE Performed at [facility], [place], [place] [address]    Gram Stain   Final    NO WBC SEEN ABUNDANT GRAM POSITIVE COCCI FEW GRAM POSITIVE RODS Performed at [facility], [place] [address], [place], [place] [address]    Culture   Final    ABUNDANT METHICILLIN RESISTANT STAPHYLOCOCCUS AUREUS NO ANAEROBES ISOLATED; CULTURE IN PROGRESS FOR 5 DAYS    Report Status PENDING  Incomplete   Organism ID, Bacteria METHICILLIN RESISTANT STAPHYLOCOCCUS AUREUS  Final      Susceptibility   Methicillin resistant staphylococcus aureus - MIC*    CIPROFLOXACIN >=8 RESISTANT Resistant     ERYTHROMYCIN >=8 RESISTANT Resistant     GENTAMICIN <=0.5 SENSITIVE Sensitive     OXACILLIN >=4 RESISTANT Resistant     TETRACYCLINE >=16 RESISTANT Resistant     VANCOMYCIN 1 SENSITIVE Sensitive     TRIMETH/SULFA <=10 SENSITIVE Sensitive     CLINDAMYCIN >=8 RESISTANT Resistant     RIFAMPIN <=0.5 SENSITIVE Sensitive     Inducible  Clindamycin NEGATIVE Sensitive     * ABUNDANT METHICILLIN RESISTANT STAPHYLOCOCCUS AUREUS  Respiratory Panel by RT PCR (Flu A&B, Covid) - Nasopharyngeal Swab     Status: None   Collection Time: 03/30/20  4:35 PM   Specimen: Nasopharyngeal Swab  Result Value Ref Range Status   SARS Coronavirus 2 by RT PCR NEGATIVE NEGATIVE Final    Comment: (NOTE) SARS-CoV-2 target nucleic acids are NOT DETECTED.  The SARS-CoV-2 RNA is generally detectable in upper respiratoy specimens during the acute phase of infection. The lowest concentration of SARS-CoV-2 viral copies this assay can detect is 131 copies/mL. A negative result does not preclude SARS-Cov-2 infection and should not be used as the sole basis for treatment or other patient management decisions. A negative result may occur with  improper specimen collection/handling, submission of specimen other than nasopharyngeal swab, presence of viral mutation(s) within the areas targeted by this assay, and inadequate number of viral copies (<131 copies/mL). A negative result must be combined with clinical observations, patient history, and epidemiological information. The expected result is Negative.  Fact Sheet for Patients:  PinkCheek.be  Fact Sheet for Healthcare Providers:  GravelBags.it  This test is no t yet approved or cleared by the [place] FDA and  has been authorized for detection and/or diagnosis of SARS-CoV-2 by FDA under an Emergency Use Authorization (EUA). This EUA will remain  in effect (meaning this test can be used) for the duration of the COVID-19 declaration under Section 564(b)(1) of the Act, 21 U.S.C. section 360bbb-3(b)(1), unless the authorization is terminated or revoked sooner.     Influenza A by PCR NEGATIVE NEGATIVE Final   Influenza B by PCR NEGATIVE NEGATIVE Final    Comment: (NOTE) The Xpert Xpress SARS-CoV-2/FLU/RSV assay is intended as an aid in   the diagnosis of influenza from Nasopharyngeal swab specimens and  should not be used as a sole basis for treatment. Nasal washings and  aspirates are unacceptable for Xpert Xpress SARS-CoV-2/FLU/RSV  testing.  Fact Sheet for Patients: PinkCheek.be  Fact Sheet for Healthcare Providers: GravelBags.it  This test is not yet approved or cleared by the [place] and  has been authorized  for detection and/or diagnosis of SARS-CoV-2 by  FDA under an Emergency Use Authorization (EUA). This EUA will remain  in effect (meaning this test can be used) for the duration of the  Covid-19 declaration under Section 564(b)(1) of the Act, 21  U.S.C. section 360bbb-3(b)(1), unless the authorization is  terminated or revoked. Performed at [facility], [address] [place], [place] [address]   Blood culture (routine x 2)     Status: Abnormal   Collection Time: 03/30/20  6:52 PM   Specimen: BLOOD  Result Value Ref Range Status   Specimen Description   Final    BLOOD RIGHT ANTECUBITAL Performed at [facility], [address], [place], [place] [address]    Special Requests   Final    BOTTLES DRAWN AEROBIC AND ANAEROBIC Blood Culture adequate volume Performed at [facility], [address] [place], [place] [address]    Culture  Setup Time   Final    Organism ID to follow GRAM POSITIVE COCCI IN BOTH AEROBIC AND ANAEROBIC BOTTLES CRITICAL RESULT CALLED TO, READ BACK BY AND VERIFIED WITH: Awanda Mink 1010 03/31/2020 DB Performed at [facility], [address] [place], [place] [address]    Culture METHICILLIN RESISTANT STAPHYLOCOCCUS AUREUS (A)  Final   Report Status 04/02/2020 FINAL  Final   Organism ID, Bacteria METHICILLIN RESISTANT STAPHYLOCOCCUS AUREUS  Final      Susceptibility   Methicillin resistant staphylococcus aureus - MIC*    CIPROFLOXACIN >=8 RESISTANT Resistant      ERYTHROMYCIN >=8 RESISTANT Resistant     GENTAMICIN <=0.5 SENSITIVE Sensitive     OXACILLIN >=4 RESISTANT Resistant     TETRACYCLINE >=16 RESISTANT Resistant     VANCOMYCIN 1 SENSITIVE Sensitive     TRIMETH/SULFA <=10 SENSITIVE Sensitive     CLINDAMYCIN >=8 RESISTANT Resistant     RIFAMPIN <=0.5 SENSITIVE Sensitive     Inducible Clindamycin NEGATIVE Sensitive     * METHICILLIN RESISTANT STAPHYLOCOCCUS AUREUS  Blood Culture ID Panel (Reflexed)     Status: Abnormal   Collection Time: 03/30/20  6:52 PM  Result Value Ref Range Status   Enterococcus faecalis NOT DETECTED NOT DETECTED Final   Enterococcus Faecium NOT DETECTED NOT DETECTED Final   Listeria monocytogenes NOT DETECTED NOT DETECTED Final   Staphylococcus species DETECTED (A) NOT DETECTED Final    Comment: CRITICAL RESULT CALLED TO, READ BACK BY AND VERIFIED WITH: MYRA SLAUGHTER. PHAR 1010 03/31/2020 DB    Staphylococcus aureus (BCID) DETECTED (A) NOT DETECTED Final    Comment: Methicillin (oxacillin)-resistant Staphylococcus aureus (MRSA). MRSA is predictably resistant to beta-lactam antibiotics (except ceftaroline). Preferred therapy is vancomycin unless clinically contraindicated. Patient requires contact precautions if  hospitalized. CRITICAL RESULT CALLED TO, READ BACK BY AND VERIFIED WITH: MYRA SLAUGHTER. PHAR 1010 03/31/2020 DB    Staphylococcus epidermidis NOT DETECTED NOT DETECTED Final   Staphylococcus lugdunensis NOT DETECTED NOT DETECTED Final   Streptococcus species NOT DETECTED NOT DETECTED Final   Streptococcus agalactiae NOT DETECTED NOT DETECTED Final   Streptococcus pneumoniae NOT DETECTED NOT DETECTED Final   Streptococcus pyogenes NOT DETECTED NOT DETECTED Final   A.calcoaceticus-baumannii NOT DETECTED NOT DETECTED Final   Bacteroides fragilis NOT DETECTED NOT DETECTED Final   Enterobacterales NOT DETECTED NOT DETECTED Final   Enterobacter cloacae complex NOT DETECTED NOT DETECTED Final   Escherichia  coli NOT DETECTED NOT DETECTED Final   Klebsiella aerogenes NOT DETECTED NOT DETECTED Final   Klebsiella oxytoca NOT DETECTED NOT DETECTED Final  Klebsiella pneumoniae NOT DETECTED NOT DETECTED Final   Proteus species NOT DETECTED NOT DETECTED Final   Salmonella species NOT DETECTED NOT DETECTED Final   Serratia marcescens NOT DETECTED NOT DETECTED Final   Haemophilus influenzae NOT DETECTED NOT DETECTED Final   Neisseria meningitidis NOT DETECTED NOT DETECTED Final   Pseudomonas aeruginosa NOT DETECTED NOT DETECTED Final   Stenotrophomonas maltophilia NOT DETECTED NOT DETECTED Final   Candida albicans NOT DETECTED NOT DETECTED Final   Candida auris NOT DETECTED NOT DETECTED Final   Candida glabrata NOT DETECTED NOT DETECTED Final   Candida krusei NOT DETECTED NOT DETECTED Final   Candida parapsilosis NOT DETECTED NOT DETECTED Final   Candida tropicalis NOT DETECTED NOT DETECTED Final   Cryptococcus neoformans/gattii NOT DETECTED NOT DETECTED Final   Meth resistant mecA/C and MREJ DETECTED (A) NOT DETECTED Final    Comment: CRITICAL RESULT CALLED TO, READ BACK BY AND VERIFIED WITH: MYRA SLAUGHTER. PHAR 1010 03/31/2020 DB Performed at [facility], [address]  [place], [place] [address]   Blood culture (routine x 2)     Status: None (Preliminary result)   Collection Time: 03/30/20  8:23 PM   Specimen: BLOOD  Result Value Ref Range Status   Specimen Description BLOOD LEFT ANTECUBITAL  Final   Special Requests   Final    BOTTLES DRAWN AEROBIC AND ANAEROBIC Blood Culture adequate volume   Culture   Final    NO GROWTH 3 DAYS Performed at [facility], [address] [place], [place] [address]    Report Status PENDING  Incomplete  Urine Culture     Status: Abnormal   Collection Time: 03/31/20  3:53 AM   Specimen: Urine, Random  Result Value Ref Range Status   Specimen Description   Final    URINE, RANDOM Performed at [facility], [address] [place], [place] [address]    Special Requests   Final    NONE Performed at [facility], [address] [place],  [address]    Culture MULTIPLE SPECIES PRESENT, SUGGEST RECOLLECTION (A)  Final   Report Status 04/01/2020 FINAL  Final     Labs: BNP (last 3 results) No results for input(s): BNP in the last 8760 hours. Basic Metabolic Panel: Recent Labs  Lab 03/30/20 1328 03/31/20 0708 04/01/20 0537  NA 144 141 140  K 4.2 4.1 3.5  CL 104 104 101  CO2 25 27 26   GLUCOSE 100* 95 110*  BUN 41* 35* 25*  CREATININE 1.77* 1.51* 1.34*  CALCIUM 9.5 9.1 9.3   Liver Function Tests: Recent Labs  Lab 03/30/20 1328  AST 23  ALT 20  ALKPHOS 69  BILITOT 0.5  PROT 8.8*  ALBUMIN 3.4*   No results for input(s): LIPASE, AMYLASE in the last 168 hours. No results for input(s): AMMONIA in the last 168 hours. CBC: Recent Labs  Lab 03/30/20 1328 03/31/20 0708 04/01/20 0537  WBC 10.5 9.5 9.2  HGB 10.8* 10.0* 11.5*  HCT 33.9* 30.1* 34.7*  MCV 80.9 80.1 79.0*  PLT 332 323 378   Cardiac Enzymes: Recent Labs  Lab 03/30/20 1328 03/31/20 0708  CKTOTAL 123 159   BNP: Invalid input(s): POCBNP CBG: No results for input(s): GLUCAP in the last 168 hours. D-Dimer No results for input(s): DDIMER in the last 72 hours. Hgb A1c No results for input(s): HGBA1C in the last 72 hours. Lipid Profile No results for input(s): CHOL, HDL, LDLCALC, TRIG, CHOLHDL, LDLDIRECT in the last 72  hours. Thyroid function studies No results for input(s): TSH, T4TOTAL, T3FREE, THYROIDAB in the last 72 hours.  Invalid input(s): FREET3 Anemia work up No results for input(s): VITAMINB12, FOLATE, FERRITIN, TIBC, IRON, RETICCTPCT in the last 72 hours. Urinalysis    Component Value Date/Time   COLORURINE YELLOW (A) 03/31/2020 0353   APPEARANCEUR CLOUDY (A) 03/31/2020 0353   LABSPEC 1.015 03/31/2020 0353   PHURINE 7.0 03/31/2020 0353   GLUCOSEU NEGATIVE 03/31/2020 0353   HGBUR  NEGATIVE 03/31/2020 0353   BILIRUBINUR NEGATIVE 03/31/2020 0353   KETONESUR NEGATIVE 03/31/2020 0353   PROTEINUR NEGATIVE 03/31/2020 0353   NITRITE NEGATIVE 03/31/2020 0353   LEUKOCYTESUR MODERATE (A) 03/31/2020 0353   Sepsis Labs Invalid input(s): PROCALCITONIN,  WBC,  LACTICIDVEN Microbiology Recent Results (from the past 240 hour(s))  Aerobic/Anaerobic Culture (surgical/deep wound)     Status: None (Preliminary result)   Collection Time: 03/30/20  1:01 AM   Specimen: Wound  Result Value Ref Range Status   Specimen Description   Final    WOUND Performed at [facility], [address], [place], [place] [address]    Special Requests   Final    NONE Performed at [facility], [address] [place], [place] [address]    Gram Stain   Final    NO WBC SEEN ABUNDANT GRAM POSITIVE COCCI FEW GRAM POSITIVE RODS Performed at [facility], [place] [address] [place], [place] [address]    Culture   Final    ABUNDANT METHICILLIN RESISTANT STAPHYLOCOCCUS AUREUS NO ANAEROBES ISOLATED; CULTURE IN PROGRESS FOR 5 DAYS    Report Status PENDING  Incomplete   Organism ID, Bacteria METHICILLIN RESISTANT STAPHYLOCOCCUS AUREUS  Final      Susceptibility   Methicillin resistant staphylococcus aureus - MIC*    CIPROFLOXACIN >=8 RESISTANT Resistant     ERYTHROMYCIN >=8 RESISTANT Resistant     GENTAMICIN <=0.5 SENSITIVE Sensitive     OXACILLIN >=4 RESISTANT Resistant     TETRACYCLINE >=16 RESISTANT Resistant     VANCOMYCIN 1 SENSITIVE Sensitive     TRIMETH/SULFA <=10 SENSITIVE Sensitive     CLINDAMYCIN >=8 RESISTANT Resistant     RIFAMPIN <=0.5 SENSITIVE Sensitive     Inducible Clindamycin NEGATIVE Sensitive     * ABUNDANT METHICILLIN RESISTANT STAPHYLOCOCCUS AUREUS  Respiratory Panel by RT PCR (Flu A&B, Covid) - Nasopharyngeal Swab     Status: None   Collection Time: 03/30/20  4:35 PM   Specimen: Nasopharyngeal Swab  Result Value Ref Range Status   SARS Coronavirus  2 by RT PCR NEGATIVE NEGATIVE Final    Comment: (NOTE) SARS-CoV-2 target nucleic acids are NOT DETECTED.  The SARS-CoV-2 RNA is generally detectable in upper respiratoy specimens during the acute phase of infection. The lowest concentration of SARS-CoV-2 viral copies this assay can detect is 131 copies/mL. A negative result does not preclude SARS-Cov-2 infection and should not be used as the sole basis for treatment or other patient management decisions. A negative result may occur with  improper specimen collection/handling, submission of specimen other than nasopharyngeal swab, presence of viral mutation(s) within the areas targeted by this assay, and inadequate number of viral copies (<131 copies/mL). A negative result must be combined with clinical observations, patient history, and epidemiological information. The expected result is Negative.  Fact Sheet for Patients:  PinkCheek.be  Fact Sheet for Healthcare Providers:  GravelBags.it  This test is no t yet approved or cleared by the [place] and  has been authorized  for detection and/or diagnosis of SARS-CoV-2 by FDA under an Emergency Use Authorization (EUA). This EUA will remain  in effect (meaning this test can be used) for the duration of the COVID-19 declaration under Section 564(b)(1) of the Act, 21 U.S.C. section 360bbb-3(b)(1), unless the authorization is terminated or revoked sooner.     Influenza A by PCR NEGATIVE NEGATIVE Final   Influenza B by PCR NEGATIVE NEGATIVE Final    Comment: (NOTE) The Xpert Xpress SARS-CoV-2/FLU/RSV assay is intended as an aid in  the diagnosis of influenza from Nasopharyngeal swab specimens and  should not be used as a sole basis for treatment. Nasal washings and  aspirates are unacceptable for Xpert Xpress SARS-CoV-2/FLU/RSV  testing.  Fact Sheet for Patients: PinkCheek.be  Fact Sheet  for Healthcare Providers: GravelBags.it  This test is not yet approved or cleared by the [place] FDA and  has been authorized for detection and/or diagnosis of SARS-CoV-2 by  FDA under an Emergency Use Authorization (EUA). This EUA will remain  in effect (meaning this test can be used) for the duration of the  Covid-19 declaration under Section 564(b)(1) of the Act, 21  U.S.C. section 360bbb-3(b)(1), unless the authorization is  terminated or revoked. Performed at [facility], [address] [place], [place] [address]   Blood culture (routine x 2)     Status: Abnormal   Collection Time: 03/30/20  6:52 PM   Specimen: BLOOD  Result Value Ref Range Status   Specimen Description   Final    BLOOD RIGHT ANTECUBITAL Performed at [facility], [address], [place], [place] [address]    Special Requests   Final    BOTTLES DRAWN AEROBIC AND ANAEROBIC Blood Culture adequate volume Performed at [facility], [address] [place], [place] [address]    Culture  Setup Time   Final    Organism ID to follow GRAM POSITIVE COCCI IN BOTH AEROBIC AND ANAEROBIC BOTTLES CRITICAL RESULT CALLED TO, READ BACK BY AND VERIFIED WITH: Awanda Mink 1010 03/31/2020 DB Performed at [facility], [address] [place], [place] [address]    Culture METHICILLIN RESISTANT STAPHYLOCOCCUS AUREUS (A)  Final   Report Status 04/02/2020 FINAL  Final   Organism ID, Bacteria METHICILLIN RESISTANT STAPHYLOCOCCUS AUREUS  Final      Susceptibility   Methicillin resistant staphylococcus aureus - MIC*    CIPROFLOXACIN >=8 RESISTANT Resistant     ERYTHROMYCIN >=8 RESISTANT Resistant     GENTAMICIN <=0.5 SENSITIVE Sensitive     OXACILLIN >=4 RESISTANT Resistant     TETRACYCLINE >=16 RESISTANT Resistant     VANCOMYCIN 1 SENSITIVE Sensitive     TRIMETH/SULFA <=10 SENSITIVE Sensitive     CLINDAMYCIN >=8 RESISTANT Resistant     RIFAMPIN  <=0.5 SENSITIVE Sensitive     Inducible Clindamycin NEGATIVE Sensitive     * METHICILLIN RESISTANT STAPHYLOCOCCUS AUREUS  Blood Culture ID Panel (Reflexed)     Status: Abnormal   Collection Time: 03/30/20  6:52 PM  Result Value Ref Range Status   Enterococcus faecalis NOT DETECTED NOT DETECTED Final   Enterococcus Faecium NOT DETECTED NOT DETECTED Final   Listeria monocytogenes NOT DETECTED NOT DETECTED Final   Staphylococcus species DETECTED (A) NOT DETECTED Final    Comment: CRITICAL RESULT CALLED TO, READ BACK BY AND VERIFIED WITH: MYRA SLAUGHTER. PHAR 1010 03/31/2020 DB    Staphylococcus aureus (BCID) DETECTED (A) NOT DETECTED Final    Comment: Methicillin (oxacillin)-resistant Staphylococcus aureus (MRSA). MRSA is  predictably resistant to beta-lactam antibiotics (except ceftaroline). Preferred therapy is vancomycin unless clinically contraindicated. Patient requires contact precautions if  hospitalized. CRITICAL RESULT CALLED TO, READ BACK BY AND VERIFIED WITH: MYRA SLAUGHTER. PHAR 1010 03/31/2020 DB    Staphylococcus epidermidis NOT DETECTED NOT DETECTED Final   Staphylococcus lugdunensis NOT DETECTED NOT DETECTED Final   Streptococcus species NOT DETECTED NOT DETECTED Final   Streptococcus agalactiae NOT DETECTED NOT DETECTED Final   Streptococcus pneumoniae NOT DETECTED NOT DETECTED Final   Streptococcus pyogenes NOT DETECTED NOT DETECTED Final   A.calcoaceticus-baumannii NOT DETECTED NOT DETECTED Final   Bacteroides fragilis NOT DETECTED NOT DETECTED Final   Enterobacterales NOT DETECTED NOT DETECTED Final   Enterobacter cloacae complex NOT DETECTED NOT DETECTED Final   Escherichia coli NOT DETECTED NOT DETECTED Final   Klebsiella aerogenes NOT DETECTED NOT DETECTED Final   Klebsiella oxytoca NOT DETECTED NOT DETECTED Final   Klebsiella pneumoniae NOT DETECTED NOT DETECTED Final   Proteus species NOT DETECTED NOT DETECTED Final   Salmonella species NOT DETECTED NOT DETECTED  Final   Serratia marcescens NOT DETECTED NOT DETECTED Final   Haemophilus influenzae NOT DETECTED NOT DETECTED Final   Neisseria meningitidis NOT DETECTED NOT DETECTED Final   Pseudomonas aeruginosa NOT DETECTED NOT DETECTED Final   Stenotrophomonas maltophilia NOT DETECTED NOT DETECTED Final   Candida albicans NOT DETECTED NOT DETECTED Final   Candida auris NOT DETECTED NOT DETECTED Final   Candida glabrata NOT DETECTED NOT DETECTED Final   Candida krusei NOT DETECTED NOT DETECTED Final   Candida parapsilosis NOT DETECTED NOT DETECTED Final   Candida tropicalis NOT DETECTED NOT DETECTED Final   Cryptococcus neoformans/gattii NOT DETECTED NOT DETECTED Final   Meth resistant mecA/C and MREJ DETECTED (A) NOT DETECTED Final    Comment: CRITICAL RESULT CALLED TO, READ BACK BY AND VERIFIED WITH: MYRA SLAUGHTER. PHAR 1010 03/31/2020 DB Performed at [facility], [address] [place], [place] [address]   Blood culture (routine x 2)     Status: None (Preliminary result)   Collection Time: 03/30/20  8:23 PM   Specimen: BLOOD  Result Value Ref Range Status   Specimen Description BLOOD LEFT ANTECUBITAL  Final   Special Requests   Final    BOTTLES DRAWN AEROBIC AND ANAEROBIC Blood Culture adequate volume   Culture   Final    NO GROWTH 3 DAYS Performed at [facility], [address], [place], [place] [address]    Report Status PENDING  Incomplete  Urine Culture     Status: Abnormal   Collection Time: 03/31/20  3:53 AM   Specimen: Urine, Random  Result Value Ref Range Status   Specimen Description   Final    URINE, RANDOM Performed at [facility], [address], [place], [place] [address]    Special Requests   Final    NONE Performed at [facility], [address] [place], [place] [address]    Culture MULTIPLE SPECIES PRESENT, SUGGEST RECOLLECTION (A)  Final   Report Status 04/01/2020 FINAL  Final     Time coordinating discharge: Over  30 minutes  SIGNED:   Wyvonnia Dusky, MD  [facility] 04/02/2020, 1:05 PM Pager   If 7PM-7AM, please contact night-coverage

## 2020-04-02 NOTE — Progress Notes (Signed)
[name]  A and O x 4 VSS. Pt tolerating diet well. No complaints of pain or nausea. IV removed intact, prescriptions given. Pt voices understanding of discharge instructions with no further questions. Pt discharged home via stretcher with EMS.   Allergies as of 04/02/2020   No Known Allergies     Medication List    STOP taking these medications   allopurinol 300 MG tablet Commonly known as: ZYLOPRIM   amLODipine 5 MG tablet Commonly known as: NORVASC   aspirin EC 81 MG tablet   atorvastatin 10 MG tablet Commonly known as: Lipitor   clopidogrel 75 MG tablet Commonly known as: Plavix   Colcrys 0.6 MG tablet Generic drug: colchicine   feeding supplement Liqd   folic acid 1 MG tablet Commonly known as: FOLVITE   gabapentin 300 MG capsule Commonly known as: NEURONTIN   hydrALAZINE 50 MG tablet Commonly known as: APRESOLINE   hyoscyamine 0.125 MG SL tablet Commonly known as: LEVSIN SL   pantoprazole 40 MG tablet Commonly known as: PROTONIX   Poly-Iron 150 Forte 150-0.025-1 MG Caps Generic drug: Iron Polysacch [id number]   Senna Laxative 8.6 MG tablet Generic drug: senna     TAKE these medications   metoprolol tartrate 25 MG tablet Commonly known as: LOPRESSOR Take 25 mg by mouth 2 (two) times daily.   morphine CONCENTRATE 10 MG/0.5ML Soln concentrated solution Take 0.25 mLs (5 mg total) by mouth every 3 (three) hours as needed for up to 7 days for moderate pain, severe pain or shortness of breath.   Santyl ointment Generic drug: collagenase Apply 1 application topically daily. What changed: Another medication with the same name was removed. Continue taking this medication, and follow the directions you see here.   sertraline 25 MG tablet Commonly known as: ZOLOFT Take 25 mg by mouth daily with breakfast.            Discharge Care Instructions  (From admission, onward)         Start     Ordered   04/02/20 0000  Discharge wound care:        Comments: As stated above but only as tolerated   04/02/20 1025          Vitals:   04/02/20 1143 04/02/20 1500  BP: (!) 125/57 (!) 112/53  Pulse: 63 69  Resp: 16 16  Temp: 98 F (36.7 C) 97.9 F (36.6 C)  SpO2: 90% 100%    [place] Pay[place] Mccallum

## 2020-04-02 NOTE — Progress Notes (Signed)
[facility] hospital Liaison note:  Follow up visit made to new referral for [facility] hospice services at home. Telephone call with patient's daughter in law Pam regarding liquid morphine prescription. Chart notes reviewed and Probation officer spoke with bedside RN Tia, plan is for patient to discharge home today via [facility] at 3 pm today. Referral notified. Pam made aware during our conversation.  Flo Shanks BSN, RN, [facility] [employer] [phone]

## 2020-04-04 LAB — CULTURE, BLOOD (ROUTINE X 2)
Culture: NO GROWTH
Special Requests: ADEQUATE

## 2020-04-05 LAB — AEROBIC/ANAEROBIC CULTURE W GRAM STAIN (SURGICAL/DEEP WOUND): Gram Stain: NONE SEEN

## 2020-04-06 ENCOUNTER — Ambulatory Visit: Payer: Medicare Other | Admitting: Physician Assistant

## 2020-04-07 ENCOUNTER — Ambulatory Visit: Payer: Medicare Other | Admitting: Internal Medicine

## 2020-04-14 DEATH — deceased

## 2020-05-05 ENCOUNTER — Ambulatory Visit (INDEPENDENT_AMBULATORY_CARE_PROVIDER_SITE_OTHER): Payer: Medicare Other | Admitting: Nurse Practitioner

## 2020-05-05 ENCOUNTER — Encounter (INDEPENDENT_AMBULATORY_CARE_PROVIDER_SITE_OTHER): Payer: Medicare Other
